# Patient Record
Sex: Male | Born: 1950
Health system: Southern US, Community
[De-identification: ages and names within clinical notes are randomized; demographics above are authoritative.]

## PROBLEM LIST (undated history)

## (undated) DIAGNOSIS — I4819 Other persistent atrial fibrillation: Secondary | ICD-10-CM

## (undated) DIAGNOSIS — I509 Heart failure, unspecified: Secondary | ICD-10-CM

## (undated) DIAGNOSIS — I428 Other cardiomyopathies: Secondary | ICD-10-CM

## (undated) DIAGNOSIS — I5020 Unspecified systolic (congestive) heart failure: Secondary | ICD-10-CM

## (undated) DIAGNOSIS — I10 Essential (primary) hypertension: Secondary | ICD-10-CM

## (undated) HISTORY — DX: Unspecified systolic (congestive) heart failure: I50.20

## (undated) HISTORY — DX: Heart failure, unspecified: I50.9

## (undated) HISTORY — DX: Other cardiomyopathies: I42.8

## (undated) HISTORY — PX: CARDIAC CATHETERIZATION: SHX172

## (undated) HISTORY — DX: Other persistent atrial fibrillation: I48.19

---

## 2011-10-04 ENCOUNTER — Emergency Department: Payer: Self-pay | Admitting: Unknown Physician Specialty

## 2015-08-01 DIAGNOSIS — Y9389 Activity, other specified: Secondary | ICD-10-CM | POA: Insufficient documentation

## 2015-08-01 DIAGNOSIS — T161XXA Foreign body in right ear, initial encounter: Secondary | ICD-10-CM | POA: Insufficient documentation

## 2015-08-01 DIAGNOSIS — Y9289 Other specified places as the place of occurrence of the external cause: Secondary | ICD-10-CM | POA: Insufficient documentation

## 2015-08-01 DIAGNOSIS — Y998 Other external cause status: Secondary | ICD-10-CM | POA: Insufficient documentation

## 2015-08-01 DIAGNOSIS — X58XXXA Exposure to other specified factors, initial encounter: Secondary | ICD-10-CM | POA: Insufficient documentation

## 2015-08-02 ENCOUNTER — Encounter: Payer: Self-pay | Admitting: Emergency Medicine

## 2015-08-02 ENCOUNTER — Emergency Department
Admission: EM | Admit: 2015-08-02 | Discharge: 2015-08-02 | Disposition: A | Discharge status: Home / Self Care | Payer: Self-pay | Attending: Emergency Medicine | Admitting: Emergency Medicine

## 2015-08-02 DIAGNOSIS — T161XXA Foreign body in right ear, initial encounter: Secondary | ICD-10-CM

## 2015-08-02 MED ORDER — LIDOCAINE HCL (PF) 1 % IJ SOLN
5.0000 mL | Freq: Once | INTRAMUSCULAR | Status: AC
  Administered 2015-08-02: 5 mL

## 2015-08-02 MED ORDER — LIDOCAINE HCL (PF) 1 % IJ SOLN
INTRAMUSCULAR | Status: AC
  Administered 2015-08-02: 5 mL
  Filled 2015-08-02: qty 5

## 2015-08-02 NOTE — Discharge Instructions (Signed)
Ear Foreign Body °An ear foreign body is an object that is stuck in the ear. It is common for young children to put objects into the ear canal. These may include pebbles, beads, beans, and any other small objects which will fit. In adults, objects such as cotton swabs may become lodged in the ear canal. In all ages, the most common foreign bodies are insects that enter the ear canal.  °SYMPTOMS  °Foreign bodies may cause pain, buzzing or roaring sounds, hearing loss, and ear drainage.  °HOME CARE INSTRUCTIONS  °· Keep all follow-up appointments with your caregiver as told. °· Keep small objects out of reach of young children. Tell them not to put anything in their ears. °SEEK IMMEDIATE MEDICAL CARE IF:  °· You have bleeding from the ear. °· You have increased pain or swelling of the ear. °· You have reduced hearing. °· You have discharge coming from the ear. °· You have a fever. °· You have a headache. °MAKE SURE YOU:  °· Understand these instructions. °· Will watch your condition. °· Will get help right away if you are not doing well or get worse. °Document Released: 11/09/2000 Document Revised: 02/04/2012 Document Reviewed: 06/30/2008 °ExitCare® Patient Information ©2015 [employer]. This information is not intended to replace advice given to you by your health care provider. Make sure you discuss any questions you have with your health care provider. ° °

## 2015-08-02 NOTE — ED Notes (Signed)
Patient ambulatory to triage with steady gait, without difficulty or distress noted; pt reports feeling something fly in his right ear while watching TV; denies pain but reports can feel it moving; using otoscope, what appears to be a roach is noted to right ear; irrigated with 73ml xylocaine 1% and pt reports no further movement noted

## 2015-08-02 NOTE — ED Provider Notes (Signed)
[facility] Emergency Department Provider Note  ____________________________________________  Time seen: 12:35 AM  I have reviewed the triage vital signs and the nursing notes.   HISTORY  Chief Complaint Foreign Body in Ear      HPI Mikeal Florencio is a 64 y.o. male presents with insect in his right ear 45 minutes. Patient states he was watching television at the time and felt the insect enters right year.   Past medical history None   There are no active problems to display for this patient.  Past surgical history None No current outpatient prescriptions on file.  Allergies No known drug allergies No family history on file.  Social History Social History  Substance Use Topics  . Smoking status: None  . Smokeless tobacco: None  . Alcohol Use: None    Review of Systems  Constitutional: Negative for fever. Eyes: Negative for visual changes. ENT: Negative for sore throat. Positive for insect in left ear Cardiovascular: Negative for chest pain. Respiratory: Negative for shortness of breath. Gastrointestinal: Negative for abdominal pain, vomiting and diarrhea. Genitourinary: Negative for dysuria. Musculoskeletal: Negative for back pain. Skin: Negative for rash. Neurological: Negative for headaches, focal weakness or numbness.   10-point ROS otherwise negative.  ____________________________________________   PHYSICAL EXAM:  VITAL SIGNS: ED Triage Vitals  Enc Vitals Group     BP 08/02/15 0018 149/92 mmHg     Pulse Rate 08/02/15 0018 80     Resp --      Temp 08/02/15 0018 98 F (36.7 C)     Temp Source 08/02/15 0018 Oral     SpO2 08/02/15 0018 99 %     Weight 08/02/15 0018 187 lb (84.823 kg)     Height 08/02/15 0018 5\' 9"  (1.753 m)     Head Cir --      Peak Flow --      Pain Score --      Pain Loc --      Pain Edu? --      Excl. in GC? --      Constitutional: Alert and oriented. Well appearing and in no  distress. Eyes: Conjunctivae are normal. PERRL. Normal extraocular movements. ENT   Head: Normocephalic and atraumatic.   Nose: No congestion/rhinnorhea.   Mouth/Throat: Mucous membranes are moist.   Neck: No stridor. Ears: Large insect noted in the patient's right external auditory canal. Hematological/Lymphatic/Immunilogical: No cervical lymphadenopathy.   PROCEDURES  Procedure(s) performed:   large cockroach removal from the patient's right external auditory canal via forceps. External auditory canal intact after removal of insect. Right external auditory canal irrigated with normal saline following procedure. Right tympanic membrane visualized and intact following removal.   ____________________________________________   INITIAL IMPRESSION / ASSESSMENT AND PLAN / ED COURSE  Pertinent labs & imaging results that were available during my care of the patient were reviewed by me and considered in my medical decision making (see chart for details).  Patient tolerated procedure well.  ____________________________________________   FINAL CLINICAL IMPRESSION(S) / ED DIAGNOSES  Final diagnoses:  Foreign body in right ear, initial encounter      Darci Current, MD 08/02/15 [phone]

## 2015-08-02 NOTE — ED Notes (Signed)
Dr Manson Passey to triage and insect removed

## 2016-02-14 ENCOUNTER — Emergency Department
Admission: EM | Admit: 2016-02-14 | Discharge: 2016-02-14 | Disposition: A | Discharge status: Home / Self Care | Payer: No Typology Code available for payment source | Attending: Student | Admitting: Student

## 2016-02-14 ENCOUNTER — Encounter: Payer: Self-pay | Admitting: Emergency Medicine

## 2016-02-14 ENCOUNTER — Emergency Department: Payer: No Typology Code available for payment source

## 2016-02-14 DIAGNOSIS — R52 Pain, unspecified: Secondary | ICD-10-CM | POA: Diagnosis not present

## 2016-02-14 DIAGNOSIS — Y9389 Activity, other specified: Secondary | ICD-10-CM | POA: Diagnosis not present

## 2016-02-14 DIAGNOSIS — Y998 Other external cause status: Secondary | ICD-10-CM | POA: Diagnosis not present

## 2016-02-14 DIAGNOSIS — M542 Cervicalgia: Secondary | ICD-10-CM | POA: Diagnosis not present

## 2016-02-14 DIAGNOSIS — Y92410 Unspecified street and highway as the place of occurrence of the external cause: Secondary | ICD-10-CM | POA: Insufficient documentation

## 2016-02-14 DIAGNOSIS — S161XXA Strain of muscle, fascia and tendon at neck level, initial encounter: Secondary | ICD-10-CM | POA: Insufficient documentation

## 2016-02-14 DIAGNOSIS — S4991XA Unspecified injury of right shoulder and upper arm, initial encounter: Secondary | ICD-10-CM | POA: Diagnosis present

## 2016-02-14 DIAGNOSIS — M25511 Pain in right shoulder: Secondary | ICD-10-CM | POA: Diagnosis not present

## 2016-02-14 DIAGNOSIS — S29002A Unspecified injury of muscle and tendon of back wall of thorax, initial encounter: Secondary | ICD-10-CM | POA: Diagnosis not present

## 2016-02-14 DIAGNOSIS — S46911A Strain of unspecified muscle, fascia and tendon at shoulder and upper arm level, right arm, initial encounter: Secondary | ICD-10-CM | POA: Diagnosis not present

## 2016-02-14 MED ORDER — IBUPROFEN 600 MG PO TABS: 600.0000 mg | ORAL_TABLET | Freq: Four times a day (QID) | ORAL | Status: DC | PRN

## 2016-02-14 MED ORDER — TRAMADOL HCL 50 MG PO TABS: 50.0000 mg | ORAL_TABLET | Freq: Four times a day (QID) | ORAL | Status: DC | PRN

## 2016-02-14 MED ORDER — IBUPROFEN 600 MG PO TABS
600.0000 mg | ORAL_TABLET | Freq: Once | ORAL | Status: AC
  Administered 2016-02-14: 600 mg via ORAL
  Filled 2016-02-14: qty 1
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
## 2016-02-14 NOTE — Discharge Instructions (Signed)

## 2018-05-03 ENCOUNTER — Encounter: Payer: Self-pay | Admitting: Emergency Medicine

## 2018-05-03 ENCOUNTER — Inpatient Hospital Stay (HOSPITAL_COMMUNITY)
Admit: 2018-05-03 | Discharge: 2018-05-03 | Disposition: A | Discharge status: Home / Self Care | Payer: Medicare Other | Attending: Internal Medicine | Admitting: Internal Medicine

## 2018-05-03 ENCOUNTER — Ambulatory Visit (INDEPENDENT_AMBULATORY_CARE_PROVIDER_SITE_OTHER)
Admission: EM | Admit: 2018-05-03 | Discharge: 2018-05-03 | Disposition: A | Discharge status: Other Acute Inpatient Hospital | Payer: Medicare Other | Source: Home / Self Care | Attending: Family Medicine | Admitting: Family Medicine

## 2018-05-03 ENCOUNTER — Inpatient Hospital Stay
Admission: EM | Admit: 2018-05-03 | Discharge: 2018-05-08 | DRG: 286 | Disposition: A | Discharge status: Home Health | Payer: Medicare Other | Attending: Internal Medicine | Admitting: Internal Medicine

## 2018-05-03 ENCOUNTER — Emergency Department: Payer: Medicare Other

## 2018-05-03 ENCOUNTER — Other Ambulatory Visit: Payer: Self-pay

## 2018-05-03 ENCOUNTER — Encounter: Payer: Self-pay | Admitting: Gynecology

## 2018-05-03 DIAGNOSIS — E876 Hypokalemia: Secondary | ICD-10-CM | POA: Diagnosis not present

## 2018-05-03 DIAGNOSIS — I5021 Acute systolic (congestive) heart failure: Secondary | ICD-10-CM | POA: Diagnosis not present

## 2018-05-03 DIAGNOSIS — R0602 Shortness of breath: Secondary | ICD-10-CM

## 2018-05-03 DIAGNOSIS — Z8249 Family history of ischemic heart disease and other diseases of the circulatory system: Secondary | ICD-10-CM

## 2018-05-03 DIAGNOSIS — I10 Essential (primary) hypertension: Secondary | ICD-10-CM | POA: Diagnosis not present

## 2018-05-03 DIAGNOSIS — Z800 Family history of malignant neoplasm of digestive organs: Secondary | ICD-10-CM

## 2018-05-03 DIAGNOSIS — Z791 Long term (current) use of non-steroidal anti-inflammatories (NSAID): Secondary | ICD-10-CM

## 2018-05-03 DIAGNOSIS — I9581 Postprocedural hypotension: Secondary | ICD-10-CM | POA: Diagnosis not present

## 2018-05-03 DIAGNOSIS — I444 Left anterior fascicular block: Secondary | ICD-10-CM | POA: Diagnosis present

## 2018-05-03 DIAGNOSIS — Z79891 Long term (current) use of opiate analgesic: Secondary | ICD-10-CM

## 2018-05-03 DIAGNOSIS — I081 Rheumatic disorders of both mitral and tricuspid valves: Secondary | ICD-10-CM | POA: Diagnosis present

## 2018-05-03 DIAGNOSIS — I509 Heart failure, unspecified: Secondary | ICD-10-CM

## 2018-05-03 DIAGNOSIS — I493 Ventricular premature depolarization: Secondary | ICD-10-CM | POA: Diagnosis present

## 2018-05-03 DIAGNOSIS — I272 Pulmonary hypertension, unspecified: Secondary | ICD-10-CM | POA: Diagnosis present

## 2018-05-03 DIAGNOSIS — I361 Nonrheumatic tricuspid (valve) insufficiency: Secondary | ICD-10-CM | POA: Diagnosis not present

## 2018-05-03 DIAGNOSIS — I428 Other cardiomyopathies: Secondary | ICD-10-CM | POA: Diagnosis present

## 2018-05-03 DIAGNOSIS — I481 Persistent atrial fibrillation: Secondary | ICD-10-CM | POA: Diagnosis present

## 2018-05-03 DIAGNOSIS — Z23 Encounter for immunization: Secondary | ICD-10-CM

## 2018-05-03 DIAGNOSIS — I4891 Unspecified atrial fibrillation: Secondary | ICD-10-CM

## 2018-05-03 DIAGNOSIS — I110 Hypertensive heart disease with heart failure: Secondary | ICD-10-CM | POA: Diagnosis not present

## 2018-05-03 DIAGNOSIS — Z8049 Family history of malignant neoplasm of other genital organs: Secondary | ICD-10-CM

## 2018-05-03 DIAGNOSIS — I340 Nonrheumatic mitral (valve) insufficiency: Secondary | ICD-10-CM | POA: Diagnosis not present

## 2018-05-03 DIAGNOSIS — R001 Bradycardia, unspecified: Secondary | ICD-10-CM | POA: Diagnosis not present

## 2018-05-03 DIAGNOSIS — I5043 Acute on chronic combined systolic (congestive) and diastolic (congestive) heart failure: Secondary | ICD-10-CM | POA: Diagnosis not present

## 2018-05-03 DIAGNOSIS — J810 Acute pulmonary edema: Secondary | ICD-10-CM

## 2018-05-03 HISTORY — DX: Essential (primary) hypertension: I10

## 2018-05-03 LAB — CBC WITH DIFFERENTIAL/PLATELET
BASOS PCT: 0 %
Basophils Absolute: 0 10*3/uL (ref 0–0.1)
EOS ABS: 0.1 10*3/uL (ref 0–0.7)
Eosinophils Relative: 1 %
HCT: 37.1 % — ABNORMAL LOW (ref 40.0–52.0)
Hemoglobin: 13.3 g/dL (ref 13.0–18.0)
Lymphocytes Relative: 16 %
Lymphs Abs: 1.1 10*3/uL (ref 1.0–3.6)
MCH: 32.4 pg (ref 26.0–34.0)
MCHC: 36 g/dL (ref 32.0–36.0)
MCV: 90.1 fL (ref 80.0–100.0)
Monocytes Absolute: 0.5 10*3/uL (ref 0.2–1.0)
Monocytes Relative: 7 %
NEUTROS ABS: 5.1 10*3/uL (ref 1.4–6.5)
NEUTROS PCT: 76 %
PLATELETS: 216 10*3/uL (ref 150–440)
RBC: 4.12 MIL/uL — ABNORMAL LOW (ref 4.40–5.90)
RDW: 12.4 % (ref 11.5–14.5)
WBC: 6.7 10*3/uL (ref 3.8–10.6)

## 2018-05-03 LAB — COMPREHENSIVE METABOLIC PANEL
ALT: 20 U/L (ref 17–63)
AST: 30 U/L (ref 15–41)
Albumin: 3.8 g/dL (ref 3.5–5.0)
Alkaline Phosphatase: 51 U/L (ref 38–126)
Anion gap: 8 (ref 5–15)
BILIRUBIN TOTAL: 0.9 mg/dL (ref 0.3–1.2)
BUN: 15 mg/dL (ref 6–20)
CHLORIDE: 108 mmol/L (ref 101–111)
CO2: 21 mmol/L — ABNORMAL LOW (ref 22–32)
Calcium: 8.5 mg/dL — ABNORMAL LOW (ref 8.9–10.3)
Creatinine, Ser: 0.84 mg/dL (ref 0.61–1.24)
GFR calc Af Amer: >60 mL/min (ref >60)
Glucose, Bld: 108 mg/dL — ABNORMAL HIGH (ref 65–99)
POTASSIUM: 3.8 mmol/L (ref 3.5–5.1)
Sodium: 137 mmol/L (ref 135–145)
TOTAL PROTEIN: 7.3 g/dL (ref 6.5–8.1)

## 2018-05-03 LAB — PROTIME-INR
INR: 1.09
Prothrombin Time: 14 seconds (ref 11.4–15.2)

## 2018-05-03 LAB — BRAIN NATRIURETIC PEPTIDE: B NATRIURETIC PEPTIDE 5: 1561 pg/mL — AB (ref 0.0–100.0)

## 2018-05-03 LAB — TROPONIN I
Troponin I: <0.03 ng/mL (ref <0.03)
Troponin I: <0.03 ng/mL (ref <0.03)

## 2018-05-03 LAB — TSH: TSH: 1.533 u[IU]/mL (ref 0.350–4.500)

## 2018-05-03 LAB — APTT: APTT: 30 s (ref 24–36)

## 2018-05-03 LAB — HEPARIN LEVEL (UNFRACTIONATED): Heparin Unfractionated: 0.39 IU/mL (ref 0.30–0.70)

## 2018-05-03 MED ORDER — MAGNESIUM SULFATE 2 GM/50ML IV SOLN
2.0000 g | Freq: Once | INTRAVENOUS | Status: AC
  Administered 2018-05-03: 2 g via INTRAVENOUS
  Filled 2018-05-03: qty 50

## 2018-05-03 MED ORDER — HEPARIN (PORCINE) IN NACL 100-0.45 UNIT/ML-% IJ SOLN
1200.0000 [IU]/h | INTRAMUSCULAR | Status: DC
  Administered 2018-05-03 – 2018-05-05 (×3): 1200 [IU]/h via INTRAVENOUS
  Filled 2018-05-03 (×3): qty 250

## 2018-05-03 MED ORDER — ONDANSETRON HCL 4 MG/2ML IJ SOLN: 4.0000 mg | Freq: Four times a day (QID) | INTRAMUSCULAR | Status: DC | PRN

## 2018-05-03 MED ORDER — METOPROLOL TARTRATE 5 MG/5ML IV SOLN
5.0000 mg | INTRAVENOUS | Status: DC | PRN
Start: 2018-05-03 — End: 2018-05-07

## 2018-05-03 MED ORDER — METOPROLOL TARTRATE 25 MG PO TABS
25.0000 mg | ORAL_TABLET | Freq: Three times a day (TID) | ORAL | Status: DC
  Administered 2018-05-03 – 2018-05-06 (×9): 25 mg via ORAL
  Filled 2018-05-03 (×9): qty 1

## 2018-05-03 MED ORDER — FUROSEMIDE 10 MG/ML IJ SOLN
40.0000 mg | Freq: Once | INTRAMUSCULAR | Status: AC
  Administered 2018-05-03: 40 mg via INTRAVENOUS

## 2018-05-03 MED ORDER — ONDANSETRON HCL 4 MG PO TABS: 4.0000 mg | ORAL_TABLET | Freq: Four times a day (QID) | ORAL | Status: DC | PRN

## 2018-05-03 MED ORDER — HEPARIN BOLUS VIA INFUSION
4000.0000 [IU] | Freq: Once | INTRAVENOUS | Status: AC
  Administered 2018-05-03: 4000 [IU] via INTRAVENOUS
  Filled 2018-05-03: qty 4000

## 2018-05-03 MED ORDER — FUROSEMIDE 10 MG/ML IJ SOLN
40.0000 mg | Freq: Every day | INTRAMUSCULAR | Status: DC
  Administered 2018-05-04: 40 mg via INTRAVENOUS
  Filled 2018-05-03: qty 4

## 2018-05-03 MED ORDER — ACETAMINOPHEN 325 MG PO TABS: 650.0000 mg | ORAL_TABLET | Freq: Four times a day (QID) | ORAL | Status: DC | PRN

## 2018-05-03 MED ORDER — LORAZEPAM 1 MG PO TABS
1.0000 mg | ORAL_TABLET | Freq: Once | ORAL | Status: AC
  Administered 2018-05-03: 1 mg via ORAL
  Filled 2018-05-03: qty 1

## 2018-05-03 MED ORDER — ACETAMINOPHEN 650 MG RE SUPP: 650.0000 mg | Freq: Four times a day (QID) | RECTAL | Status: DC | PRN

## 2018-05-03 MED ORDER — ASPIRIN EC 81 MG PO TBEC
81.0000 mg | DELAYED_RELEASE_TABLET | Freq: Every day | ORAL | Status: DC
  Administered 2018-05-04: 81 mg via ORAL
  Filled 2018-05-03: qty 1

## 2018-05-03 MED ORDER — METOPROLOL TARTRATE 25 MG PO TABS
25.0000 mg | ORAL_TABLET | Freq: Once | ORAL | Status: AC
  Administered 2018-05-03: 25 mg via ORAL
  Filled 2018-05-03: qty 1

## 2018-05-03 MED ORDER — PNEUMOCOCCAL VAC POLYVALENT 25 MCG/0.5ML IJ INJ
0.5000 mL | INJECTION | INTRAMUSCULAR | Status: AC
  Administered 2018-05-04: 0.5 mL via INTRAMUSCULAR
  Filled 2018-05-03: qty 0.5

## 2018-05-03 MED ORDER — FUROSEMIDE 10 MG/ML IJ SOLN
INTRAMUSCULAR | Status: AC
  Administered 2018-05-03: 40 mg via INTRAVENOUS
  Filled 2018-05-03: qty 4

## 2018-05-03 NOTE — ED Notes (Signed)
Dorian, EDT called to transport pt to the floor

## 2018-05-03 NOTE — Progress Notes (Signed)
Patient ID: Donald Molina, male   DOB: 1951/05/22, 66 y.o.   MRN: [id number]  ACP note  Patient and family at bedside  Diagnosis: Acute congestive heart failure, atrial fibrillation with rapid ventricular response, essential hypertension.  CODE STATUS discussed.  Patient would like to be a full code at this point in time.  Plan echocardiogram to evaluate heart function.  Slow the heart rate down with metoprolol.  Cardiology consultation.  Monitor further on telemetry  Time spent on ACP discussion 17 minutes  Dr. Alford Highland

## 2018-05-03 NOTE — Progress Notes (Signed)
ANTICOAGULATION CONSULT NOTE - Initial Consult  Pharmacy Consult for heparin drip  Indication: atrial fibrillation  No Known Allergies  Patient Measurements: Height: 5\' 7"  (170.2 cm) Weight: 179 lb (81.2 kg) IBW/kg (Calculated) : 66.1 Heparin Dosing Weight: 81.2 kg   Vital Signs: Temp: 98 F (36.7 C) (06/08 1148) Temp Source: Oral (06/08 1148) BP: 130/109 (06/08 1300) Pulse Rate: 57 (06/08 1300)  Labs: Recent Labs    05/03/18 1153  HGB 13.3  HCT 37.1*  PLT 216  CREATININE 0.84  TROPONINI <0.03    Estimated Creatinine Clearance: 87 mL/min (by C-G formula based on SCr of 0.84 mg/dL).   Medical History: Past Medical History:  Diagnosis Date  . Hypertension    Assessment: 67 year old male presents to ED with SOB and in new onset atrial fibrillation. Has not been on anticoagulation PTA.   Goal of Therapy:  Heparin level 0.3-0.7 units/ml Monitor platelets by anticoagulation protocol: Yes   Plan:  Will give heparin bolus 4000 units followed by heparin drip 1200 units/hr. Will check first HL 6 hours from start of heparin drip.  APTT and PT/INR ordered.  Will monitor H&H and daily heparin levels.   Cleopatra Cedar, PharmD Pharmacy Resident  05/03/2018,1:32 PM

## 2018-05-03 NOTE — ED Notes (Signed)
Patient transported to X-ray 

## 2018-05-03 NOTE — Progress Notes (Signed)
Patient called out to NT, saying he couldn't get comfortable and he couldn't get any rest and that he seems a little anxious.  On call MD paged and called back.  We shall try 1 mg ativan tonight.

## 2018-05-03 NOTE — Consult Note (Signed)
Cardiology Consultation:   Patient ID: Donald Molina; [id number]; [birth year]   Admit date: 05/03/2018 Date of Consult: 05/03/2018  Primary Care Provider: Patient, No Pcp Per Primary Cardiologist: No primary care provider on file. New patient    Patient Profile:   Donald Molina is a 67 y.o. male with no medical history who is being seen today for the evaluation of AFib at the request of Dr. Renae Gloss.  History of Present Illness:   Donald Molina is a 67 year old man who does not see the doctor on a regular basis.  His family is with him in the hospital room.  He reports he has been short of breath for the past several days.  The family states it has been on and off for several weeks.  Over the past few days, he has had trouble lying flat to sleep.  He has had to prop up on several pillows.  He noticed some leg swelling.  Because of these symptoms, he came to the emergency room.  He was found to be in atrial fibrillation with rapid ventricular response.  He was also found to be volume overloaded with pleural effusions noted on chest x-ray.  He was given metoprolol for rate control and IV Lasix.  He was started on IV heparin.  We had a discussion about the issues related to atrial fibrillation including rapid heart rates and embolic stroke along with heart failure.  His blood pressure is been elevated while in the emergency room and he has received metoprolol to help with this.  Currently, he feels better.  Past Medical History:  Diagnosis Date  . Hypertension     Past Surgical History:  Procedure Laterality Date  . NO PAST SURGERIES        Inpatient Medications: Scheduled Meds: . aspirin EC  81 mg Oral Daily  . [START ON 05/04/2018] furosemide  40 mg Intravenous Daily  . metoprolol tartrate  25 mg Oral TID   Continuous Infusions: . heparin 1,200 Units/hr (05/03/18 1416)   PRN Meds: acetaminophen **OR** acetaminophen, metoprolol tartrate, ondansetron **OR** ondansetron (ZOFRAN)  IV  Allergies:   No Known Allergies  Social History:   Social History   Socioeconomic History  . Marital status: Divorced    Spouse name: Not on file  . Number of children: Not on file  . Years of education: Not on file  . Highest education level: Not on file  Occupational History  . Not on file  Social Needs  . Financial resource strain: Not on file  . Food insecurity:    Worry: Not on file    Inability: Not on file  . Transportation needs:    Medical: Not on file    Non-medical: Not on file  Tobacco Use  . Smoking status: Never Smoker  . Smokeless tobacco: Never Used  Substance and Sexual Activity  . Alcohol use: Yes  . Drug use: Never  . Sexual activity: Not on file  Lifestyle  . Physical activity:    Days per week: Not on file    Minutes per session: Not on file  . Stress: Not on file  Relationships  . Social connections:    Talks on phone: Not on file    Gets together: Not on file    Attends religious service: Not on file    Active member of club or organization: Not on file    Attends meetings of clubs or organizations: Not on file    Relationship status: Not on file  .  Intimate partner violence:    Fear of current or ex partner: Not on file    Emotionally abused: Not on file    Physically abused: Not on file    Forced sexual activity: Not on file  Other Topics Concern  . Not on file  Social History Narrative  . Not on file    Family History:    Family History  Problem Relation Age of Onset  . Cancer Mother        pancreatic  . Cervical cancer Mother   . Dementia Father   . Heart failure Father      ROS:  Please see the history of present illness.  Shortness of breath All other ROS reviewed and negative.     Physical Exam/Data:   Vitals:   05/03/18 1230 05/03/18 1300 05/03/18 1404 05/03/18 1409  BP: (!) 157/97 (!) 130/109 (!) 135/100   Pulse:  (!) 57 96   Resp: (!) 24 18 18    Temp:   97.6 F (36.4 C)   TempSrc:   Oral   SpO2:  97%  99%   Weight:    183 lb 9.6 oz (83.3 kg)  Height:    5\' 11"  (1.803 m)   No intake or output data in the 24 hours ending 05/03/18 1458 Filed Weights   05/03/18 1149 05/03/18 1409  Weight: 179 lb (81.2 kg) 183 lb 9.6 oz (83.3 kg)   Body mass index is 25.61 kg/m.  General:  Well nourished, well developed, in no acute distress HEENT: normal Lymph: no adenopathy Neck: no JVD Endocrine:  No thryomegaly Vascular: palpable radial pulse Cardiac:  normal S1, S2; irregularly irregular; no murmur  Lungs:  clear to auscultation bilaterally, no wheezing, rhonchi or rales  Abd: soft, nontender, no hepatomegaly  Ext: 1+ bilateral ankle edema Musculoskeletal:  No deformities, BUE and BLE strength normal and equal Skin: warm and dry  Neuro:  CNs 2-12 intact, no focal abnormalities noted Psych:  Normal affect   EKG:  The EKG was personally reviewed and demonstrates: Atrial fibrillation with rapid ventricular response,ST-T wave changes, particularly in the anterior leads which could be consistent with ischemia Telemetry:  Telemetry was personally reviewed and demonstrates:  Atrial fibrillation with better rate control  Relevant CV Studies: None, echocardiogram pending  Laboratory Data:  Chemistry Recent Labs  Lab 05/03/18 1153  NA 137  K 3.8  CL 108  CO2 21*  GLUCOSE 108*  BUN 15  CREATININE 0.84  CALCIUM 8.5*  GFRNONAA >60  GFRAA >60  ANIONGAP 8    Recent Labs  Lab 05/03/18 1153  PROT 7.3  ALBUMIN 3.8  AST 30  ALT 20  ALKPHOS 51  BILITOT 0.9   Hematology Recent Labs  Lab 05/03/18 1153  WBC 6.7  RBC 4.12*  HGB 13.3  HCT 37.1*  MCV 90.1  MCH 32.4  MCHC 36.0  RDW 12.4  PLT 216   Cardiac Enzymes Recent Labs  Lab 05/03/18 1153  TROPONINI <0.03   No results for input(s): TROPIPOC in the last 168 hours.  BNP Recent Labs  Lab 05/03/18 1153  BNP 1,561.0*    DDimer No results for input(s): DDIMER in the last 168 hours.  Radiology/Studies:  Dg Chest 2  View  Result Date: 05/03/2018 CLINICAL DATA:  New dyspnea EXAM: CHEST - 2 VIEW COMPARISON:  10/04/2011 chest radiograph. FINDINGS: Stable cardiomediastinal silhouette with mild cardiomegaly. No pneumothorax. Hyperinflated lungs. Small bilateral pleural effusions. Mild pulmonary edema. No acute consolidative airspace disease.  IMPRESSION: 1. Mild congestive heart failure with small bilateral pleural effusions. 2. Hyperinflated lungs, cannot exclude COPD. Electronically Signed   By: Delbert Phenix M.D.   On: 05/03/2018 12:16    Assessment and Plan:   1. Atrial fibrillation: Continue metoprolol for rate control.  Will need to check echocardiogram to evaluate for structural heart disease.  This will also help guide medical treatment for blood pressure and help clarify chads vasc score.  This score is at least 2 score based on his age and hypertension, so he will need oral anticoagulation upon discharge if no bleeding issues are found.  IV heparin for now.  Would not consider antiarrhythmic at this time given his lack of symptoms of palpitations.  We will have to see how easily heart failure can be managed. 2. Congestive heart failure, acute: Unclear whether this is systolic or diastolic.  Check echocardiogram.  Continue IV Lasixfor pulmonary edema. 3. Long-term, he will need a regular primary care physician.  I explained to him that there are many screening tests that he would be due for given his age.   For questions or updates, please contact [facility] Please consult [email or web address] for contact info under Cardiology/STEMI.   SignedLance Muss, MD  05/03/2018 2:58 PM

## 2018-05-03 NOTE — Progress Notes (Signed)
ANTICOAGULATION CONSULT NOTE - Initial Consult  Pharmacy Consult for heparin drip  Indication: atrial fibrillation  No Known Allergies  Patient Measurements: Height: 5\' 11"  (180.3 cm) Weight: 183 lb 9.6 oz (83.3 kg) IBW/kg (Calculated) : 75.3 Heparin Dosing Weight: 81.2 kg   Vital Signs: Temp: 98.5 F (36.9 C) (06/08 2019) Temp Source: Oral (06/08 2019) BP: 134/94 (06/08 2019) Pulse Rate: 92 (06/08 2019)  Labs: Recent Labs    05/03/18 1153 05/03/18 1506 05/03/18 1851  HGB 13.3  --   --   HCT 37.1*  --   --   PLT 216  --   --   APTT 30  --   --   LABPROT 14.0  --   --   INR 1.09  --   --   HEPARINUNFRC  --   --  0.39  CREATININE 0.84  --   --   TROPONINI <0.03 <0.03 <0.03    Estimated Creatinine Clearance: 90.9 mL/min (by C-G formula based on SCr of 0.84 mg/dL).   Medical History: Past Medical History:  Diagnosis Date  . Hypertension    Assessment: 66 year old male presents to ED with SOB and in new onset atrial fibrillation. Has not been on anticoagulation PTA.   Goal of Therapy:  Heparin level 0.3-0.7 units/ml Monitor platelets by anticoagulation protocol: Yes   Plan:  Will give heparin bolus 4000 units followed by heparin drip 1200 units/hr. Will check first HL 6 hours from start of heparin drip.  APTT and PT/INR ordered.  Will monitor H&H and daily heparin levels.   6/8:  HL @ 19:00 = 0.39 Will continue this pt on current rate and recheck HL on 6/9 @ 0100.   Scherrie Gerlach, PharmD Pharmacy Resident  05/03/2018,9:12 PM

## 2018-05-03 NOTE — H&P (Signed)
[facility] [place] [facility]   PATIENT NAME: Donald Molina    MR#:  [id number]  DATE OF BIRTH:  Dec 26, 1950  DATE OF ADMISSION:  05/03/2018  PRIMARY CARE PHYSICIAN: None  REQUESTING/REFERRING PHYSICIAN: Dr Merrily Brittle  CHIEF COMPLAINT:   Chief Complaint  Patient presents with  . Shortness of Breath  . Atrial Fibrillation    HISTORY OF PRESENT ILLNESS:  Donald Molina  is a 67 y.o. male presents the ED with shortness of breath going on for 3 days.  His feet have been swollen since last night.  He does get some chest discomfort when he lies flat.  He is unable to breathe when he lies flat.  The chest pain only happens when he lies back.  Not severe in nature.  Relieved when he sits up.  No shortness of breath has been progressive for the last 3 days.  In the ED, he was found to have atrial fibrillation and congestive heart failure and hospitalist services were contacted for further evaluation.  PAST MEDICAL HISTORY:   Past Medical History:  Diagnosis Date  . Hypertension     PAST SURGICAL HISTORY:   Past Surgical History:  Procedure Laterality Date  . NO PAST SURGERIES      SOCIAL HISTORY:   Social History   Tobacco Use  . Smoking status: Never Smoker  . Smokeless tobacco: Never Used  Substance Use Topics  . Alcohol use: Yes    FAMILY HISTORY:   Family History  Problem Relation Age of Onset  . Cancer Mother        pancreatic  . Cervical cancer Mother   . Dementia Father   . Heart failure Father     DRUG ALLERGIES:  No Known Allergies  REVIEW OF SYSTEMS:  CONSTITUTIONAL: No fever, chills or sweats..  Occasional sweats.  Positive for weight loss.  Positive for weakness EYES: No blurred or double vision.  EARS, NOSE, AND THROAT: No tinnitus or ear pain. No sore throat RESPIRATORY: No cough, positive for shortness of breath.  No wheezing or hemoptysis.  CARDIOVASCULAR: Positive for chest pain when lying flat.  No  orthopnea, edema.  GASTROINTESTINAL: No nausea, vomiting, diarrhea or abdominal pain. No blood in bowel movements GENITOURINARY: No dysuria, hematuria.  ENDOCRINE: No polyuria, nocturia,  HEMATOLOGY: No anemia, easy bruising or bleeding SKIN: No rash or lesion.  Positive for itching.  MUSCULOSKELETAL: No joint pain or arthritis.   NEUROLOGIC: No tingling, numbness, weakness.  PSYCHIATRY: No anxiety or depression.   MEDICATIONS AT HOME:   Prior to Admission medications   Not on File   Patient does not take any medications.  VITAL SIGNS:  Blood pressure (!) 130/109, pulse (!) 57, temperature 98 F (36.7 C), temperature source Oral, resp. rate 18, height 5\' 7"  (1.702 m), weight 81.2 kg (179 lb), SpO2 97 %. Heart rate on the monitor anywhere from 110-119 PHYSICAL EXAMINATION:  GENERAL:  67 y.o.-year-old patient lying in the bed with no acute distress.  EYES: Pupils equal, round, reactive to light and accommodation. No scleral icterus. Extraocular muscles intact.  HEENT: Head atraumatic, normocephalic. Oropharynx and nasopharynx clear.  NECK:  Supple, no jugular venous distention. No thyroid enlargement, no tenderness.  LUNGS:  decreased breath sounds bilateral bases, positive rales at the bases.  No rhonchi or crepitation. No use of accessory muscles of respiration.  CARDIOVASCULAR: S1, S2  irregular regular tachycardic. No murmurs, rubs, or gallops.  ABDOMEN: Soft, nontender, nondistended. Bowel sounds present. No  organomegaly or mass.  EXTREMITIES:  1+ pedal edema, no cyanosis, or clubbing.  NEUROLOGIC: Cranial nerves II through XII are intact. Muscle strength 5/5 in all extremities. Sensation intact. Gait not checked.  PSYCHIATRIC: The patient is alert and oriented x 3.  SKIN: No rash, lesion, or ulcer.   LABORATORY PANEL:   CBC Recent Labs  Lab 05/03/18 1153  WBC 6.7  HGB 13.3  HCT 37.1*  PLT 216    ------------------------------------------------------------------------------------------------------------------  Chemistries  Recent Labs  Lab 05/03/18 1153  NA 137  K 3.8  CL 108  CO2 21*  GLUCOSE 108*  BUN 15  CREATININE 0.84  CALCIUM 8.5*  AST 30  ALT 20  ALKPHOS 51  BILITOT 0.9   ------------------------------------------------------------------------------------------------------------------  Cardiac Enzymes Recent Labs  Lab 05/03/18 1153  TROPONINI <0.03   ------------------------------------------------------------------------------------------------------------------  RADIOLOGY:  Dg Chest 2 View  Result Date: 05/03/2018 CLINICAL DATA:  New dyspnea EXAM: CHEST - 2 VIEW COMPARISON:  10/04/2011 chest radiograph. FINDINGS: Stable cardiomediastinal silhouette with mild cardiomegaly. No pneumothorax. Hyperinflated lungs. Small bilateral pleural effusions. Mild pulmonary edema. No acute consolidative airspace disease. IMPRESSION: 1. Mild congestive heart failure with small bilateral pleural effusions. 2. Hyperinflated lungs, cannot exclude COPD. Electronically Signed   By: Delbert Phenix M.D.   On: 05/03/2018 12:16    EKG:   Atrial fibrillation 109 bpm, left anterior fascicular block, flipped T waves laterally IMPRESSION AND PLAN:   1.  Acute congestive heart failure.  Give Lasix 40 mg IV daily.  Check an echocardiogram. 2.  Atrial fibrillation with rapid ventricular response.  Start oral metoprolol.  PRN IV metoprolol.  Check an echocardiogram.  Heparin drip for now.  Give IV magnesium.  Check a TSH.  Cardiology consultation. 3.  Essential hypertension start metoprolol.   All the records are reviewed and case discussed with ED provider. Management plans discussed with the patient, family and they are in agreement.  CODE STATUS: full code  TOTAL TIME TAKING CARE OF THIS PATIENT: 50 minutes.    Alford Highland M.D on 05/03/2018 at 1:34 PM  Between 7am to 6pm -  Pager - [phone]  After 6pm call admission pager [phone]  [facility]  [phone]  CC: Primary care physician; Patient, No Pcp Per

## 2018-05-03 NOTE — ED Notes (Signed)
Attempted to call report on pt, RN not available will call me back.

## 2018-05-03 NOTE — Plan of Care (Signed)
  Problem: Education: Goal: Knowledge of General Education information will improve Outcome: Progressing   Problem: Health Behavior/Discharge Planning: Goal: Ability to manage health-related needs will improve Outcome: Progressing   Problem: Clinical Measurements: Goal: Will remain free from infection Outcome: Progressing   Problem: Nutrition: Goal: Adequate nutrition will be maintained Outcome: Progressing   Problem: Safety: Goal: Ability to remain free from injury will improve Outcome: Progressing

## 2018-05-03 NOTE — ED Triage Notes (Signed)
Per patient shortness of breath when at rest x 3 days. Patient stated both feet swollen x this am.

## 2018-05-03 NOTE — ED Provider Notes (Signed)
[facility]    CSN: [id number] Arrival date & time: 05/03/18  1031  History   Chief Complaint Chief Complaint  Patient presents with  . Shortness of Breath   HPI  67 year old male presents with shortness of breath.  Patient reports a 3-day history of shortness of breath.  He states that is primarily at night when he lies down.  He reports difficulty lying flat.  He also reports that he wakes up short of breath in the middle of the night.  He reports that he has had lower extremity edema as of this morning.  No reports of chest pain.  He takes no medication.  He has a history of hypertension.  No known relieving factors.  Symptoms are severe.  He also reports dyspnea with exertion.  No other associated symptoms.  No other complaints or concerns at this time.  Past Medical History:  Diagnosis Date  . Hypertension    History reviewed. No pertinent surgical history.  Home Medications    Prior to Admission medications   Medication Sig Start Date End Date Taking? Authorizing Provider  ibuprofen (ADVIL,MOTRIN) 600 MG tablet Take 1 tablet (600 mg total) by mouth every 6 (six) hours as needed. 02/14/16  Yes Triplett, Cari B, FNP  traMADol (ULTRAM) 50 MG tablet Take 1 tablet (50 mg total) by mouth every 6 (six) hours as needed. 02/14/16   Chinita Pester, FNP   Family History Family History  Problem Relation Age of Onset  . Cancer Mother        pancreatic  . Dementia Father    Social History Social History   Tobacco Use  . Smoking status: Never Smoker  . Smokeless tobacco: Never Used  Substance Use Topics  . Alcohol use: Yes  . Drug use: Never   Allergies   Patient has no known allergies.   Review of Systems Review of Systems  Constitutional: Negative.   Respiratory:       Shortness of breath, PND, orthopnea.  Cardiovascular: Positive for leg swelling.   Physical Exam Triage Vital Signs ED Triage Vitals  Enc Vitals Group     BP 05/03/18 1043 (!) 157/107       Pulse Rate 05/03/18 1043 (!) 112     Resp --      Temp 05/03/18 1043 98.2 F (36.8 C)     Temp Source 05/03/18 1043 Oral     SpO2 05/03/18 1043 100 %     Weight 05/03/18 1045 179 lb (81.2 kg)     Height --      Head Circumference --      Peak Flow --      Pain Score 05/03/18 1044 3     Pain Loc --      Pain Edu? --      Excl. in GC? --    Updated Vital Signs BP (!) 157/107 (BP Location: Left Arm)   Pulse (!) 112   Temp 98.2 F (36.8 C) (Oral)   Wt 179 lb (81.2 kg)   SpO2 100%   BMI 26.43 kg/m     Physical Exam  Constitutional: He is oriented to person, place, and time. He appears well-developed. No distress.  HENT:  Head: Normocephalic and atraumatic.  Eyes: Conjunctivae are normal. Right eye exhibits no discharge. Left eye exhibits no discharge.  Cardiovascular:  Irregularly irregular.  Tachycardic. 1-2+ pitting lower extremity edema.  Pulmonary/Chest: Effort normal and breath sounds normal.  Neurological: He is alert and oriented  to person, place, and time.  Psychiatric: He has a normal mood and affect. His behavior is normal.  Nursing note and vitals reviewed.  UC Treatments / Results  Labs (all labs ordered are listed, but only abnormal results are displayed) Labs Reviewed - No data to display  EKG Interpretation: Atrial fibrillation with RVR.  T wave inversion V2, V3, V4, V5.  Radiology No results found.  Procedures Procedures (including critical care time)  Medications Ordered in UC Medications - No data to display  Initial Impression / Assessment and Plan / UC Course  I have reviewed the triage vital signs and the nursing notes.  Pertinent labs & imaging results that were available during my care of the patient were reviewed by me and considered in my medical decision making (see chart for details).    67 year old male presents with new onset atrial fibrillation and resulting congestive heart failure.  Patient stable currently.  Patient was  given aspirin and will be transferred via EMS to the hospital for likely admission.  Needs echocardiogram, labs, cardiac work-up.  Final Clinical Impressions(s) / UC Diagnoses   Final diagnoses:  New onset atrial fibrillation (HCC)  Acute congestive heart failure, unspecified heart failure type [facility])   Discharge Instructions   None    ED Prescriptions    None     Controlled Substance Prescriptions [place] Controlled Substance Registry consulted? Not Applicable   Tommie Sams, DO 05/03/18 1123

## 2018-05-03 NOTE — ED Provider Notes (Signed)
[facility] Emergency Department Provider Note  ____________________________________________   First MD Initiated Contact with Patient 05/03/18 1145     (approximate)  I have reviewed the triage vital signs and the nursing notes.   HISTORY  Chief Complaint Shortness of Breath and Atrial Fibrillation   HPI Donald Molina is a 67 y.o. male who comes to the emergency department via EMS from med in urgent care for evaluation of shortness of breath.  The patient has a long-standing history of hypertension although has not taken any medications in about 5 years.  He says that for the past week he has had gradual onset slowly progressive worsening shortness of breath.  The symptoms are worse when lying flat and improved with sitting up.  Also worse with exertion.  Somewhat improved with rest.  He has had no cough.  No fevers or chills.  He has noticed some mild leg swelling.  He was given 324 mg of aspirin in urgent care.  He currently takes no medications.  His symptoms are moderate in severity.  Past Medical History:  Diagnosis Date  . Hypertension     Patient Active Problem List   Diagnosis Date Noted  . CHF (congestive heart failure) (HCC) 05/03/2018  . Atrial fibrillation with RVR (HCC)   . Acute pulmonary edema [facility])     Past Surgical History:  Procedure Laterality Date  . NO PAST SURGERIES      Prior to Admission medications   Not on File    Allergies Patient has no known allergies.  Family History  Problem Relation Age of Onset  . Cancer Mother        pancreatic  . Cervical cancer Mother   . Dementia Father   . Heart failure Father     Social History Social History   Tobacco Use  . Smoking status: Never Smoker  . Smokeless tobacco: Never Used  Substance Use Topics  . Alcohol use: Yes  . Drug use: Never    Review of Systems Constitutional: No fever/chills Eyes: No visual changes. ENT: No sore throat. Cardiovascular: Denies  chest pain. Respiratory: Positive for shortness of breath. Gastrointestinal: No abdominal pain.  No nausea, no vomiting.  No diarrhea.  No constipation. Genitourinary: Negative for dysuria. Musculoskeletal: Negative for back pain. Skin: Negative for rash. Neurological: Negative for headaches, focal weakness or numbness.   ____________________________________________   PHYSICAL EXAM:  VITAL SIGNS: ED Triage Vitals  Enc Vitals Group     BP      Pulse      Resp      Temp      Temp src      SpO2      Weight      Height      Head Circumference      Peak Flow      Pain Score      Pain Loc      Pain Edu?      Excl. in GC?     Constitutional: Alert and oriented x4 pleasant cooperative mildly uncomfortable appearing Eyes: PERRL EOMI. Head: Atraumatic. Nose: No congestion/rhinnorhea. Mouth/Throat: No trismus Neck: No stridor.  Able to lie completely flat although is uncomfortable and has some JVD Cardiovascular: Irregularly irregular and tachycardic Respiratory: Crackles at bilateral bases moving good air Gastrointestinal: Soft nontender Musculoskeletal: Legs are equal in size mild edema Neurologic:  Normal speech and language. No gross focal neurologic deficits are appreciated. Skin:  Skin is warm, dry and intact.  No rash noted. Psychiatric: Mood and affect are normal. Speech and behavior are normal.    ____________________________________________   DIFFERENTIAL includes but not limited to  Atrial fibrillation, acute coronary syndrome, fluid overload, congestive heart failure ____________________________________________   LABS (all labs ordered are listed, but only abnormal results are displayed)  Labs Reviewed  COMPREHENSIVE METABOLIC PANEL - Abnormal; Notable for the following components:      Result Value   CO2 21 (*)    Glucose, Bld 108 (*)    Calcium 8.5 (*)    All other components within normal limits  BRAIN NATRIURETIC PEPTIDE - Abnormal; Notable for  the following components:   B Natriuretic Peptide 1,561.0 (*)    All other components within normal limits  CBC WITH DIFFERENTIAL/PLATELET - Abnormal; Notable for the following components:   RBC 4.12 (*)    HCT 37.1 (*)    All other components within normal limits  BASIC METABOLIC PANEL - Abnormal; Notable for the following components:   Glucose, Bld 109 (*)    Calcium 8.5 (*)    All other components within normal limits  CBC - Abnormal; Notable for the following components:   RBC 4.14 (*)    HCT 37.3 (*)    All other components within normal limits  LIPID PANEL - Abnormal; Notable for the following components:   HDL 38 (*)    All other components within normal limits  TROPONIN I  TSH  PROTIME-INR  APTT  TROPONIN I  TROPONIN I  HEPARIN LEVEL (UNFRACTIONATED)  HEPARIN LEVEL (UNFRACTIONATED)    Lab work reviewed by me with no evidence of acute ischemia but elevated BNP consistent with new heart failure __________________________________________  EKG  ED ECG REPORT I, Merrily Brittle, the attending physician, personally viewed and interpreted this ECG.  Date: 05/03/2018 EKG Time:  Rate: 109 Rhythm: Atrial fibrillation with rapid ventricular response QRS Axis: Leftward axis Intervals: normal ST/T Wave abnormalities: Evidence of LVH with repolarization abnormalities Narrative Interpretation: no evidence of acute ischemia  ____________________________________________  RADIOLOGY  Chest x-ray reviewed by me consistent with pleural effusions and pulmonary edema  ____________________________________________   PROCEDURES  Procedure(s) performed: no  .Critical Care Performed by: Merrily Brittle, MD Authorized by: Merrily Brittle, MD   Critical care provider statement:    Critical care time (minutes):  30   Critical care time was exclusive of:  Separately billable procedures and treating other patients   Critical care was necessary to treat or prevent imminent or  life-threatening deterioration of the following conditions:  Cardiac failure   Critical care was time spent personally by me on the following activities:  Development of treatment plan with patient or surrogate, discussions with consultants, evaluation of patient's response to treatment, examination of patient, obtaining history from patient or surrogate, ordering and performing treatments and interventions, ordering and review of laboratory studies, ordering and review of radiographic studies, pulse oximetry, re-evaluation of patient's condition and review of old charts    Critical Care performed: S  Observation: no ____________________________________________   INITIAL IMPRESSION / ASSESSMENT AND PLAN / ED COURSE  Pertinent labs & imaging results that were available during my care of the patient were reviewed by me and considered in my medical decision making (see chart for details).  The patient arrives tachycardic and new onset atrial fibrillation with rapid ventricular response.  He is clearly fluid overloaded and unable to lie completely flat.  I am concerned for new onset heart failure especially considering his LVH with repolarization  abnormalities.  Chest x-ray and broad labs are pending but the patient will require inpatient admission for diuresis and management of his atrial fibrillation.  I will begin him on metoprolol now.  Following metoprolol the patient's symptoms are somewhat improved.  He is clinically fluid overloaded with elevated BNP and chest x-ray showing pleural effusions and pulmonary edema.  Given his acute pulmonary edema and A. fib RVR he requires inpatient admission for continued diuresis and echocardiogram and medication optimization.  The patient verbalizes understanding agree with the plan.  The hospitalist has graciously agreed to admit the patient to his service.      ____________________________________________   FINAL CLINICAL IMPRESSION(S) / ED  DIAGNOSES  Final diagnoses:  Atrial fibrillation with RVR (HCC)  Acute pulmonary edema (HCC)      NEW MEDICATIONS STARTED DURING THIS VISIT:  There are no discharge medications for this patient.    Note:  This document was prepared using Dragon voice recognition software and may include unintentional dictation errors.     Merrily Brittle, MD 05/04/18 1236

## 2018-05-03 NOTE — ED Triage Notes (Signed)
Pt to ED via [facility] from [facility] for shortness of breath when laying flat and new onset A Fib. Pt has PMH of HTN but is not currently taking amy medications. Pt denies chest pain or shortness of breath at this time. States that he sleeps on 2 pillows at night. Pt is in NAD at this time. Pt was given 324 mg of ASA at urgent care.  Per EMS vital signs as follows  BP: 163/113 HR: 60-120, A fib on monitor SpO2: 97% on room air.

## 2018-05-03 NOTE — Progress Notes (Signed)
Report received from Roylene Reason, RN.

## 2018-05-04 DIAGNOSIS — I5021 Acute systolic (congestive) heart failure: Secondary | ICD-10-CM

## 2018-05-04 DIAGNOSIS — I10 Essential (primary) hypertension: Secondary | ICD-10-CM

## 2018-05-04 LAB — BASIC METABOLIC PANEL
ANION GAP: 8 (ref 5–15)
BUN: 16 mg/dL (ref 6–20)
CHLORIDE: 106 mmol/L (ref 101–111)
CO2: 22 mmol/L (ref 22–32)
CREATININE: 0.75 mg/dL (ref 0.61–1.24)
Calcium: 8.5 mg/dL — ABNORMAL LOW (ref 8.9–10.3)
GFR calc non Af Amer: >60 mL/min (ref >60)
Glucose, Bld: 109 mg/dL — ABNORMAL HIGH (ref 65–99)
Potassium: 3.8 mmol/L (ref 3.5–5.1)
SODIUM: 136 mmol/L (ref 135–145)

## 2018-05-04 LAB — ECHOCARDIOGRAM COMPLETE
Height: 71 in
Weight: 2937.6 [oz_av]

## 2018-05-04 LAB — CBC
HCT: 37.3 % — ABNORMAL LOW (ref 40.0–52.0)
Hemoglobin: 13.2 g/dL (ref 13.0–18.0)
MCH: 31.8 pg (ref 26.0–34.0)
MCHC: 35.3 g/dL (ref 32.0–36.0)
MCV: 90.1 fL (ref 80.0–100.0)
PLATELETS: 202 10*3/uL (ref 150–440)
RBC: 4.14 MIL/uL — AB (ref 4.40–5.90)
RDW: 12.5 % (ref 11.5–14.5)
WBC: 6.8 10*3/uL (ref 3.8–10.6)

## 2018-05-04 LAB — LIPID PANEL
CHOL/HDL RATIO: 3.3 ratio
CHOLESTEROL: 126 mg/dL (ref 0–200)
HDL: 38 mg/dL — AB (ref >40)
LDL Cholesterol: 75 mg/dL (ref 0–99)
Triglycerides: 65 mg/dL (ref <150)
VLDL: 13 mg/dL (ref 0–40)

## 2018-05-04 LAB — PROTIME-INR
INR: 1.13
PROTHROMBIN TIME: 14.4 s (ref 11.4–15.2)

## 2018-05-04 LAB — HEPARIN LEVEL (UNFRACTIONATED): HEPARIN UNFRACTIONATED: 0.41 [IU]/mL (ref 0.30–0.70)

## 2018-05-04 MED ORDER — ADULT MULTIVITAMIN W/MINERALS CH
1.0000 | ORAL_TABLET | Freq: Every day | ORAL | Status: DC
  Administered 2018-05-05 – 2018-05-08 (×4): 1 via ORAL
  Filled 2018-05-04 (×4): qty 1

## 2018-05-04 MED ORDER — LORAZEPAM 2 MG/ML IJ SOLN
1.0000 mg | Freq: Four times a day (QID) | INTRAMUSCULAR | Status: AC | PRN
  Administered 2018-05-04: 1 mg via INTRAVENOUS
  Filled 2018-05-04: qty 1

## 2018-05-04 MED ORDER — SODIUM CHLORIDE 0.9% FLUSH: 3.0000 mL | INTRAVENOUS | Status: DC | PRN

## 2018-05-04 MED ORDER — FOLIC ACID 1 MG PO TABS
1.0000 mg | ORAL_TABLET | Freq: Every day | ORAL | Status: DC
  Administered 2018-05-05 – 2018-05-08 (×4): 1 mg via ORAL
  Filled 2018-05-04 (×4): qty 1

## 2018-05-04 MED ORDER — VITAMIN B-1 100 MG PO TABS
100.0000 mg | ORAL_TABLET | Freq: Every day | ORAL | Status: DC
  Administered 2018-05-05 – 2018-05-08 (×4): 100 mg via ORAL
  Filled 2018-05-04 (×4): qty 1

## 2018-05-04 MED ORDER — FUROSEMIDE 10 MG/ML IJ SOLN
40.0000 mg | Freq: Two times a day (BID) | INTRAMUSCULAR | Status: AC
  Administered 2018-05-04: 40 mg via INTRAVENOUS
  Filled 2018-05-04: qty 4

## 2018-05-04 MED ORDER — SODIUM CHLORIDE 0.9 % IV SOLN: 250.0000 mL | INTRAVENOUS | Status: DC | PRN

## 2018-05-04 MED ORDER — SODIUM CHLORIDE 0.9 % IV SOLN
INTRAVENOUS | Status: DC
  Administered 2018-05-05: 09:00:00 via INTRAVENOUS

## 2018-05-04 MED ORDER — THIAMINE HCL 100 MG/ML IJ SOLN: 100.0000 mg | Freq: Every day | INTRAMUSCULAR | Status: DC

## 2018-05-04 MED ORDER — LORAZEPAM 1 MG PO TABS
1.0000 mg | ORAL_TABLET | Freq: Four times a day (QID) | ORAL | Status: AC | PRN
  Administered 2018-05-05: 1 mg via ORAL
  Filled 2018-05-04 (×2): qty 1

## 2018-05-04 MED ORDER — SODIUM CHLORIDE 0.9% FLUSH
3.0000 mL | Freq: Two times a day (BID) | INTRAVENOUS | Status: DC
  Administered 2018-05-04: 3 mL via INTRAVENOUS

## 2018-05-04 MED ORDER — ASPIRIN 81 MG PO CHEW
81.0000 mg | CHEWABLE_TABLET | ORAL | Status: AC
  Administered 2018-05-05: 81 mg via ORAL
  Filled 2018-05-04: qty 1

## 2018-05-04 MED ORDER — SODIUM CHLORIDE 0.9% FLUSH
3.0000 mL | Freq: Two times a day (BID) | INTRAVENOUS | Status: DC
  Administered 2018-05-04 – 2018-05-07 (×3): 3 mL via INTRAVENOUS

## 2018-05-04 NOTE — Progress Notes (Signed)
Progress Note  Patient Name: Donald Molina Date of Encounter: 05/04/2018  Primary Cardiologist: No primary care provider on file.   Subjective   Feels better.  Stated he could lie flat last night.  Inpatient Medications    Scheduled Meds: . aspirin EC  81 mg Oral Daily  . furosemide  40 mg Intravenous BID  . metoprolol tartrate  25 mg Oral TID  . sodium chloride flush  3 mL Intravenous Q12H   Continuous Infusions: . heparin 1,200 Units/hr (05/04/18 0713)   PRN Meds: acetaminophen **OR** acetaminophen, metoprolol tartrate, ondansetron **OR** ondansetron (ZOFRAN) IV   Vital Signs    Vitals:   05/03/18 2019 05/04/18 0345 05/04/18 0807 05/04/18 0941  BP: (!) 134/94 (!) 110/94 (!) 142/97   Pulse: 92 98 70   Resp: 17  18 (!) 28  Temp: 98.5 F (36.9 C) 98.1 F (36.7 C)    TempSrc: Oral Oral    SpO2: 98% 96% 99%   Weight:      Height:        Intake/Output Summary (Last 24 hours) at 05/04/2018 1506 Last data filed at 05/04/2018 1002 Gross per 24 hour  Intake 281.6 ml  Output 950 ml  Net -668.4 ml   Filed Weights   05/03/18 1149 05/03/18 1409  Weight: 179 lb (81.2 kg) 183 lb 9.6 oz (83.3 kg)    Telemetry    Atrial fibrillation, improve rate control- Personally Reviewed  ECG    None recent  Physical Exam   GEN: No acute distress.   Neck: No JVD Cardiac:  Irregularly irregular, no murmurs, rubs, or gallops.  Respiratory: Clear to auscultation bilaterally. GI: Soft, nontender, non-distended  MS: No edema; No deformity. Neuro:  Nonfocal  Psych: Normal affect   Labs    Chemistry Recent Labs  Lab 05/03/18 1153 05/04/18 0138  NA 137 136  K 3.8 3.8  CL 108 106  CO2 21* 22  GLUCOSE 108* 109*  BUN 15 16  CREATININE 0.84 0.75  CALCIUM 8.5* 8.5*  PROT 7.3  --   ALBUMIN 3.8  --   AST 30  --   ALT 20  --   ALKPHOS 51  --   BILITOT 0.9  --   GFRNONAA >60 >60  GFRAA >60 >60  ANIONGAP 8 8     Hematology Recent Labs  Lab 05/03/18 1153  05/04/18 0138  WBC 6.7 6.8  RBC 4.12* 4.14*  HGB 13.3 13.2  HCT 37.1* 37.3*  MCV 90.1 90.1  MCH 32.4 31.8  MCHC 36.0 35.3  RDW 12.4 12.5  PLT 216 202    Cardiac Enzymes Recent Labs  Lab 05/03/18 1153 05/03/18 1506 05/03/18 1851  TROPONINI <0.03 <0.03 <0.03   No results for input(s): TROPIPOC in the last 168 hours.   BNP Recent Labs  Lab 05/03/18 1153  BNP 1,561.0*     DDimer No results for input(s): DDIMER in the last 168 hours.   Radiology    Dg Chest 2 View  Result Date: 05/03/2018 CLINICAL DATA:  New dyspnea EXAM: CHEST - 2 VIEW COMPARISON:  10/04/2011 chest radiograph. FINDINGS: Stable cardiomediastinal silhouette with mild cardiomegaly. No pneumothorax. Hyperinflated lungs. Small bilateral pleural effusions. Mild pulmonary edema. No acute consolidative airspace disease. IMPRESSION: 1. Mild congestive heart failure with small bilateral pleural effusions. 2. Hyperinflated lungs, cannot exclude COPD. Electronically Signed   By: Delbert Phenix M.D.   On: 05/03/2018 12:16    Cardiac Studies   Echocardiogram revealed ejection fraction 25  to 30%  Patient Profile     67 y.o. male acute systolic heart failure and atrial fibrillation  Assessment & Plan    1) atrial fibrillation: Rate control has improved.  Ventricular rates consistently below 100.  Continue current dose of metoprolol.  2) acute systolic heart failure: Need to determine etiology.  Could be from tachycardia induced cardiomyopathy.  Another possibility would be coronary artery disease.  Given his age, we will plan for right and left heart cath to evaluate pulmonary pressures and evaluate for obstructive coronary artery disease.  The procedure was explained to the patient and all questions were answered.  The family was present as well.  He is agreeable.  3) hypertension: Continue beta-blocker for now.  Would like to add ACE inhibitor vs. Entresto after his heart cath to help his LV systolic dysfunction.   Blood pressure should tolerate at least a low-dose ACE inhibitor.  For questions or updates, please contact [facility] Please consult [email or web address] for contact info under Cardiology/STEMI.      Signed, Lance Muss, MD  05/04/2018, 3:06 PM

## 2018-05-04 NOTE — H&P (View-Only) (Signed)
Progress Note  Patient Name: Donald Molina Date of Encounter: 05/04/2018  Primary Cardiologist: No primary care provider on file.   Subjective   Feels better.  Stated he could lie flat last night.  Inpatient Medications    Scheduled Meds: . aspirin EC  81 mg Oral Daily  . furosemide  40 mg Intravenous BID  . metoprolol tartrate  25 mg Oral TID  . sodium chloride flush  3 mL Intravenous Q12H   Continuous Infusions: . heparin 1,200 Units/hr (05/04/18 0713)   PRN Meds: acetaminophen **OR** acetaminophen, metoprolol tartrate, ondansetron **OR** ondansetron (ZOFRAN) IV   Vital Signs    Vitals:   05/03/18 2019 05/04/18 0345 05/04/18 0807 05/04/18 0941  BP: (!) 134/94 (!) 110/94 (!) 142/97   Pulse: 92 98 70   Resp: 17  18 (!) 28  Temp: 98.5 F (36.9 C) 98.1 F (36.7 C)    TempSrc: Oral Oral    SpO2: 98% 96% 99%   Weight:      Height:        Intake/Output Summary (Last 24 hours) at 05/04/2018 1506 Last data filed at 05/04/2018 1002 Gross per 24 hour  Intake 281.6 ml  Output 950 ml  Net -668.4 ml   Filed Weights   05/03/18 1149 05/03/18 1409  Weight: 179 lb (81.2 kg) 183 lb 9.6 oz (83.3 kg)    Telemetry    Atrial fibrillation, improve rate control- Personally Reviewed  ECG    None recent  Physical Exam   GEN: No acute distress.   Neck: No JVD Cardiac:  Irregularly irregular, no murmurs, rubs, or gallops.  Respiratory: Clear to auscultation bilaterally. GI: Soft, nontender, non-distended  MS: No edema; No deformity. Neuro:  Nonfocal  Psych: Normal affect   Labs    Chemistry Recent Labs  Lab 05/03/18 1153 05/04/18 0138  NA 137 136  K 3.8 3.8  CL 108 106  CO2 21* 22  GLUCOSE 108* 109*  BUN 15 16  CREATININE 0.84 0.75  CALCIUM 8.5* 8.5*  PROT 7.3  --   ALBUMIN 3.8  --   AST 30  --   ALT 20  --   ALKPHOS 51  --   BILITOT 0.9  --   GFRNONAA >60 >60  GFRAA >60 >60  ANIONGAP 8 8     Hematology Recent Labs  Lab 05/03/18 1153  05/04/18 0138  WBC 6.7 6.8  RBC 4.12* 4.14*  HGB 13.3 13.2  HCT 37.1* 37.3*  MCV 90.1 90.1  MCH 32.4 31.8  MCHC 36.0 35.3  RDW 12.4 12.5  PLT 216 202    Cardiac Enzymes Recent Labs  Lab 05/03/18 1153 05/03/18 1506 05/03/18 1851  TROPONINI <0.03 <0.03 <0.03   No results for input(s): TROPIPOC in the last 168 hours.   BNP Recent Labs  Lab 05/03/18 1153  BNP 1,561.0*     DDimer No results for input(s): DDIMER in the last 168 hours.   Radiology    Dg Chest 2 View  Result Date: 05/03/2018 CLINICAL DATA:  New dyspnea EXAM: CHEST - 2 VIEW COMPARISON:  10/04/2011 chest radiograph. FINDINGS: Stable cardiomediastinal silhouette with mild cardiomegaly. No pneumothorax. Hyperinflated lungs. Small bilateral pleural effusions. Mild pulmonary edema. No acute consolidative airspace disease. IMPRESSION: 1. Mild congestive heart failure with small bilateral pleural effusions. 2. Hyperinflated lungs, cannot exclude COPD. Electronically Signed   By: Delbert Phenix M.D.   On: 05/03/2018 12:16    Cardiac Studies   Echocardiogram revealed ejection fraction 25  to 30%  Patient Profile     67 y.o. male acute systolic heart failure and atrial fibrillation  Assessment & Plan    1) atrial fibrillation: Rate control has improved.  Ventricular rates consistently below 100.  Continue current dose of metoprolol.  2) acute systolic heart failure: Need to determine etiology.  Could be from tachycardia induced cardiomyopathy.  Another possibility would be coronary artery disease.  Given his age, we will plan for right and left heart cath to evaluate pulmonary pressures and evaluate for obstructive coronary artery disease.  The procedure was explained to the patient and all questions were answered.  The family was present as well.  He is agreeable.  3) hypertension: Continue beta-blocker for now.  Would like to add ACE inhibitor vs. Entresto after his heart cath to help his LV systolic dysfunction.   Blood pressure should tolerate at least a low-dose ACE inhibitor.  For questions or updates, please contact [facility] Please consult [email or web address] for contact info under Cardiology/STEMI.      Signed, Lance Muss, MD  05/04/2018, 3:06 PM

## 2018-05-04 NOTE — Progress Notes (Signed)
ANTICOAGULATION CONSULT NOTE - Initial Consult  Pharmacy Consult for heparin drip  Indication: atrial fibrillation  No Known Allergies  Patient Measurements: Height: 5\' 11"  (180.3 cm) Weight: 183 lb 9.6 oz (83.3 kg) IBW/kg (Calculated) : 75.3 Heparin Dosing Weight: 81.2 kg   Vital Signs: Temp: 98.5 F (36.9 C) (06/08 2019) Temp Source: Oral (06/08 2019) BP: 134/94 (06/08 2019) Pulse Rate: 92 (06/08 2019)  Labs: Recent Labs    05/03/18 1153 05/03/18 1506 05/03/18 1851 05/04/18 0138  HGB 13.3  --   --  13.2  HCT 37.1*  --   --  37.3*  PLT 216  --   --  202  APTT 30  --   --   --   LABPROT 14.0  --   --   --   INR 1.09  --   --   --   HEPARINUNFRC  --   --  0.39 0.41  CREATININE 0.84  --   --   --   TROPONINI <0.03 <0.03 <0.03  --     Estimated Creatinine Clearance: 90.9 mL/min (by C-G formula based on SCr of 0.84 mg/dL).   Medical History: Past Medical History:  Diagnosis Date  . Hypertension    Assessment: 67 year old male presents to ED with SOB and in new onset atrial fibrillation. Has not been on anticoagulation PTA.   Goal of Therapy:  Heparin level 0.3-0.7 units/ml Monitor platelets by anticoagulation protocol: Yes   Plan:  Will give heparin bolus 4000 units followed by heparin drip 1200 units/hr. Will check first HL 6 hours from start of heparin drip.  APTT and PT/INR ordered.  Will monitor H&H and daily heparin levels.   6/8:  HL @ 19:00 = 0.39 Will continue this pt on current rate and recheck HL on 6/9 @ 0100.   06/09 @ 0138 HL 0.41 therapeutic. Will continue current rate 1200 units/hr and will recheck HL w/ am labs. CBC stable.  Thomasene Ripple, PharmD, BCPS Clinical Pharmacist 05/04/2018

## 2018-05-04 NOTE — Progress Notes (Signed)
[facility] - [place] at [facility]   PATIENT NAME: Donald Molina    MR#:  [id number]  DATE OF BIRTH:  02-02-1951  SUBJECTIVE: Patient admitted for new onset A. fib, congestive heart failure.  Patient had echocardiogram but results are pending.  He says he is feeling much better, denies any shortness of breath.  Heart rate is in 70s.  CHIEF COMPLAINT:   Chief Complaint  Patient presents with  . Shortness of Breath  . Atrial Fibrillation    REVIEW OF SYSTEMS:   ROS CONSTITUTIONAL: No fever, fatigue or weakness.  EYES: No blurred or double vision.  EARS, NOSE, AND THROAT: No tinnitus or ear pain.  RESPIRATORY: No cough, shortness of breath, wheezing or hemoptysis.  CARDIOVASCULAR: No chest pain, orthopnea, edema.  GASTROINTESTINAL: No nausea, vomiting, diarrhea or abdominal pain.  GENITOURINARY: No dysuria, hematuria.  ENDOCRINE: No polyuria, nocturia,  HEMATOLOGY: No anemia, easy bruising or bleeding SKIN: No rash or lesion. MUSCULOSKELETAL: No joint pain or arthritis.   NEUROLOGIC: No tingling, numbness, weakness.  PSYCHIATRY: No anxiety or depression.   DRUG ALLERGIES:  No Known Allergies  VITALS:  Blood pressure (!) 142/97, pulse 70, temperature 98.1 F (36.7 C), temperature source Oral, resp. rate (!) 28, height 5\' 11"  (1.803 m), weight 83.3 kg (183 lb 9.6 oz), SpO2 99 %.  PHYSICAL EXAMINATION:  GENERAL:  67 y.o.-year-old patient lying in the bed with no acute distress.  EYES: Pupils equal, round, reactive to light and accommodation. No scleral icterus. Extraocular muscles intact.  HEENT: Head atraumatic, normocephalic. Oropharynx and nasopharynx clear.  NECK:  Supple, no jugular venous distention. No thyroid enlargement, no tenderness.  LUNGS: Normal breath sounds bilaterally, no wheezing, rales,rhonchi or crepitation. No use of accessory muscles of respiration.  CARDIOVASCULAR: S1, S2 irregular.  No murmurs, rubs, or gallops.  ABDOMEN:  Soft, nontender, nondistended. Bowel sounds present. No organomegaly or mass.  EXTREMITIES: No pedal edema, cyanosis, or clubbing.  NEUROLOGIC: Cranial nerves II through XII are intact. Muscle strength 5/5 in all extremities. Sensation intact. Gait not checked.  PSYCHIATRIC: The patient is alert and oriented x 3.  SKIN: No obvious rash, lesion, or ulcer.    LABORATORY PANEL:   CBC Recent Labs  Lab 05/04/18 0138  WBC 6.8  HGB 13.2  HCT 37.3*  PLT 202   ------------------------------------------------------------------------------------------------------------------  Chemistries  Recent Labs  Lab 05/03/18 1153 05/04/18 0138  NA 137 136  K 3.8 3.8  CL 108 106  CO2 21* 22  GLUCOSE 108* 109*  BUN 15 16  CREATININE 0.84 0.75  CALCIUM 8.5* 8.5*  AST 30  --   ALT 20  --   ALKPHOS 51  --   BILITOT 0.9  --    ------------------------------------------------------------------------------------------------------------------  Cardiac Enzymes Recent Labs  Lab 05/03/18 1851  TROPONINI <0.03   ------------------------------------------------------------------------------------------------------------------  RADIOLOGY:  Dg Chest 2 View  Result Date: 05/03/2018 CLINICAL DATA:  New dyspnea EXAM: CHEST - 2 VIEW COMPARISON:  10/04/2011 chest radiograph. FINDINGS: Stable cardiomediastinal silhouette with mild cardiomegaly. No pneumothorax. Hyperinflated lungs. Small bilateral pleural effusions. Mild pulmonary edema. No acute consolidative airspace disease. IMPRESSION: 1. Mild congestive heart failure with small bilateral pleural effusions. 2. Hyperinflated lungs, cannot exclude COPD. Electronically Signed   By: Delbert Phenix M.D.   On: 05/03/2018 12:16    EKG:   Orders placed or performed during the hospital encounter of 05/03/18  . ED EKG  . ED EKG  . EKG 12-Lead  . EKG 12-Lead  ASSESSMENT AND PLAN:  #1 .ewly diagnosed atrial fibrillation: Patient is on metoprolol for rate  control, Chads Vascat least 2 so he is on heparin drip.  Continue to monitor on telemetry, follow echocardiogram. 2.  Congestive heart failure: Acute: Unclear whether acute systolic or diastolic heart failure.  Continue IV Lasix, follow echocardiogram results.  Likely discharge tomorrow.      All the records are reviewed and case discussed with Care Management/Social Workerr. Management plans discussed with the patient, family and they are in agreement.  CODE STATUS: Full code  TOTAL TIME TAKING CARE OF THIS PATIENT: 38 minutes.   POSSIBLE D/C IN 1-2 DAYS, DEPENDING ON CLINICAL CONDITION.   Katha Hamming M.D on 05/04/2018 at 11:16 AM  Between 7am to 6pm - Pager - [phone]  After 6pm go to [email or web address] - password [email or web address]  [facility]  Office  [phone]  CC: Primary care physician; Patient, No Pcp Per   Note: This dictation was prepared with Dragon dictation along with smaller phrase technology. Any transcriptional errors that result from this process are unintentional.

## 2018-05-05 ENCOUNTER — Encounter: Payer: Self-pay | Admitting: *Deleted

## 2018-05-05 ENCOUNTER — Encounter
Admission: EM | Disposition: A | Discharge status: Home Health | Payer: Self-pay | Source: Home / Self Care | Attending: Internal Medicine

## 2018-05-05 DIAGNOSIS — I5021 Acute systolic (congestive) heart failure: Secondary | ICD-10-CM

## 2018-05-05 DIAGNOSIS — I4891 Unspecified atrial fibrillation: Secondary | ICD-10-CM

## 2018-05-05 HISTORY — PX: RIGHT/LEFT HEART CATH AND CORONARY ANGIOGRAPHY: CATH118266

## 2018-05-05 LAB — HEPARIN LEVEL (UNFRACTIONATED): HEPARIN UNFRACTIONATED: 0.46 [IU]/mL (ref 0.30–0.70)

## 2018-05-05 SURGERY — RIGHT/LEFT HEART CATH AND CORONARY ANGIOGRAPHY
Anesthesia: Moderate Sedation

## 2018-05-05 MED ORDER — SODIUM CHLORIDE 0.9% FLUSH: 3.0000 mL | INTRAVENOUS | Status: DC | PRN

## 2018-05-05 MED ORDER — LIDOCAINE HCL (PF) 1 % IJ SOLN
INTRAMUSCULAR | Status: DC | PRN
  Administered 2018-05-05: 6 mL

## 2018-05-05 MED ORDER — HEPARIN SODIUM (PORCINE) 1000 UNIT/ML IJ SOLN
INTRAMUSCULAR | Status: DC | PRN
  Administered 2018-05-05: 4000 [IU] via INTRAVENOUS

## 2018-05-05 MED ORDER — FUROSEMIDE 10 MG/ML IJ SOLN
40.0000 mg | Freq: Two times a day (BID) | INTRAMUSCULAR | Status: DC
  Administered 2018-05-05 – 2018-05-08 (×5): 40 mg via INTRAVENOUS
  Filled 2018-05-05 (×5): qty 4

## 2018-05-05 MED ORDER — HEPARIN (PORCINE) IN NACL 1000-0.9 UT/500ML-% IV SOLN
INTRAVENOUS | Status: AC
Start: 2018-05-05 — End: ?
  Filled 2018-05-05: qty 500

## 2018-05-05 MED ORDER — POTASSIUM CHLORIDE CRYS ER 20 MEQ PO TBCR
40.0000 meq | EXTENDED_RELEASE_TABLET | Freq: Once | ORAL | Status: AC
  Administered 2018-05-05: 40 meq via ORAL
  Filled 2018-05-05: qty 2

## 2018-05-05 MED ORDER — LIDOCAINE HCL (PF) 1 % IJ SOLN
INTRAMUSCULAR | Status: AC
  Filled 2018-05-05: qty 30

## 2018-05-05 MED ORDER — FENTANYL CITRATE (PF) 100 MCG/2ML IJ SOLN
INTRAMUSCULAR | Status: AC
  Filled 2018-05-05: qty 2

## 2018-05-05 MED ORDER — HEPARIN SODIUM (PORCINE) 1000 UNIT/ML IJ SOLN
INTRAMUSCULAR | Status: AC
  Filled 2018-05-05: qty 1

## 2018-05-05 MED ORDER — IOPAMIDOL (ISOVUE-300) INJECTION 61%
INTRAVENOUS | Status: DC | PRN
  Administered 2018-05-05: 50 mL via INTRA_ARTERIAL

## 2018-05-05 MED ORDER — VERAPAMIL HCL 2.5 MG/ML IV SOLN
INTRAVENOUS | Status: AC
  Filled 2018-05-05: qty 2

## 2018-05-05 MED ORDER — FENTANYL CITRATE (PF) 100 MCG/2ML IJ SOLN
INTRAMUSCULAR | Status: DC | PRN
  Administered 2018-05-05: 25 ug via INTRAVENOUS

## 2018-05-05 MED ORDER — SODIUM CHLORIDE 0.9% FLUSH
3.0000 mL | Freq: Two times a day (BID) | INTRAVENOUS | Status: DC
  Administered 2018-05-06 – 2018-05-07 (×3): 3 mL via INTRAVENOUS

## 2018-05-05 MED ORDER — HEPARIN (PORCINE) IN NACL 100-0.45 UNIT/ML-% IJ SOLN
1200.0000 [IU]/h | INTRAMUSCULAR | Status: DC
  Administered 2018-05-05: 1200 [IU]/h via INTRAVENOUS
  Filled 2018-05-05: qty 250

## 2018-05-05 MED ORDER — AMIODARONE HCL 200 MG PO TABS
400.0000 mg | ORAL_TABLET | Freq: Two times a day (BID) | ORAL | Status: DC
  Administered 2018-05-05 – 2018-05-08 (×7): 400 mg via ORAL
  Filled 2018-05-05 (×7): qty 2

## 2018-05-05 MED ORDER — SODIUM CHLORIDE 0.9 % IV SOLN: 250.0000 mL | INTRAVENOUS | Status: DC | PRN

## 2018-05-05 MED ORDER — MIDAZOLAM HCL 2 MG/2ML IJ SOLN
INTRAMUSCULAR | Status: AC
Start: 2018-05-05 — End: ?
  Filled 2018-05-05: qty 2

## 2018-05-05 MED ORDER — MIDAZOLAM HCL 2 MG/2ML IJ SOLN
INTRAMUSCULAR | Status: DC | PRN
  Administered 2018-05-05: 1 mg via INTRAVENOUS

## 2018-05-05 SURGICAL SUPPLY — 13 items
BAND COMPRESS 30CM LRG LONG (HEMOSTASIS) ×1
BAND ZEPHYR COMPRESS 30 LONG (HEMOSTASIS) ×2 IMPLANT
CATH BALLN WEDGE 5F 110CM (CATHETERS) ×3 IMPLANT
CATH INFINITI 5FR ANG PIGTAIL (CATHETERS) ×3 IMPLANT
CATH INFINITI 5FR JK (CATHETERS) ×3 IMPLANT
GUIDEWIRE .025 260CM (WIRE) ×3 IMPLANT
KIT MANI 3VAL PERCEP (MISCELLANEOUS) ×3 IMPLANT
KIT RIGHT HEART (MISCELLANEOUS) ×3 IMPLANT
NEEDLE PERC 21GX4CM (NEEDLE) ×3 IMPLANT
PACK CARDIAC CATH (CUSTOM PROCEDURE TRAY) ×3 IMPLANT
SHEATH RAIN 4/5FR (SHEATH) ×3 IMPLANT
SHEATH RAIN RADIAL 21G 6FR (SHEATH) ×3 IMPLANT
WIRE ROSEN-J .035X260CM (WIRE) ×3 IMPLANT

## 2018-05-05 NOTE — Progress Notes (Signed)
[facility] - [place] at [facility]   PATIENT NAME: Donald Molina    MR#:  [id number]  DATE OF BIRTH:  1951-09-08    CHIEF COMPLAINT:   Chief Complaint  Patient presents with  . Shortness of Breath  . Atrial Fibrillation   shortness of breath continues to be present. Fatigue. Not on oxygen. Afebrile. Lower extremity swelling is improving  REVIEW OF SYSTEMS:   ROS CONSTITUTIONAL: No fever, fatigue or weakness.  EYES: No blurred or double vision.  EARS, NOSE, AND THROAT: No tinnitus or ear pain.  RESPIRATORY: No cough, shortness of breath, wheezing or hemoptysis.  CARDIOVASCULAR: No chest pain, orthopnea, edema.  GASTROINTESTINAL: No nausea, vomiting, diarrhea or abdominal pain.  GENITOURINARY: No dysuria, hematuria.  ENDOCRINE: No polyuria, nocturia,  HEMATOLOGY: No anemia, easy bruising or bleeding SKIN: No rash or lesion. MUSCULOSKELETAL: No joint pain or arthritis.   NEUROLOGIC: No tingling, numbness, weakness.  PSYCHIATRY: No anxiety or depression.   DRUG ALLERGIES:  No Known Allergies  VITALS:  Blood pressure 118/74, pulse 68, temperature 98.3 F (36.8 C), resp. rate 16, height 5\' 11"  (1.803 m), weight 83 kg (183 lb), SpO2 98 %.  PHYSICAL EXAMINATION:  GENERAL:  68 y.o.-year-old patient lying in the bed with no acute distress.  EYES: Pupils equal, round, reactive to light and accommodation. No scleral icterus. Extraocular muscles intact.  HEENT: Head atraumatic, normocephalic. Oropharynx and nasopharynx clear.  NECK:  Supple, no jugular venous distention. No thyroid enlargement, no tenderness.  LUNGS: Normal breath sounds bilaterally, no wheezing, rales,rhonchi or crepitation. No use of accessory muscles of respiration.  CARDIOVASCULAR: S1, S2 irregular.  No murmurs, rubs, or gallops.  ABDOMEN: Soft, nontender, nondistended. Bowel sounds present. No organomegaly or mass.  EXTREMITIES: No cyanosis, or clubbing.  Bilateral lower [employer]  edema NEUROLOGIC: Cranial nerves II through XII are intact. Muscle strength 5/5 in all extremities. Sensation intact. Gait not checked.  PSYCHIATRIC: The patient is alert and oriented x 3.  SKIN: No obvious rash, lesion, or ulcer.    LABORATORY PANEL:   CBC Recent Labs  Lab 05/04/18 0138  WBC 6.8  HGB 13.2  HCT 37.3*  PLT 202   ------------------------------------------------------------------------------------------------------------------  Chemistries  Recent Labs  Lab 05/03/18 1153 05/04/18 0138  NA 137 136  K 3.8 3.8  CL 108 106  CO2 21* 22  GLUCOSE 108* 109*  BUN 15 16  CREATININE 0.84 0.75  CALCIUM 8.5* 8.5*  AST 30  --   ALT 20  --   ALKPHOS 51  --   BILITOT 0.9  --    ------------------------------------------------------------------------------------------------------------------  Cardiac Enzymes Recent Labs  Lab 05/03/18 1851  TROPONINI <0.03   ------------------------------------------------------------------------------------------------------------------  RADIOLOGY:  No results found.  EKG:   Orders placed or performed during the hospital encounter of 05/03/18  . ED EKG  . ED EKG  . EKG 12-Lead  . EKG 12-Lead  . EKG  . EKG 12-Lead  . EKG 12-Lead  . EKG 12-Lead  . EKG 12-Lead    ASSESSMENT AND PLAN:   *Acute on chronic systolic congestive heart failure. Ejection fraction 25 to 30%. IV Lasix 40 BID. Moderate input and output. Repeat BMP in the morning. Cardiac catheterization with normal coronaries.  *New onset atrial fibrillation on metoprolol. Amiodarone added. Continue heparin drip. Start NOAC tomorrow.  *Hypertension. Continue metoprolol  All the records are reviewed and case discussed with Care Management/Social Workerr. Management plans discussed with the patient, family and they are in agreement.  CODE STATUS: Full code  TOTAL TIME TAKING CARE OF THIS PATIENT: 30 minutes.   POSSIBLE D/C IN 1-2 DAYS, DEPENDING ON  CLINICAL CONDITION.  Molinda Bailiff Itzel Mckibbin M.D on 05/05/2018 at 3:22 PM  Between 7am to 6pm - Pager - [phone]  After 6pm go to [email or web address] - password [email or web address]  [facility]  Office  [phone]  CC: Primary care physician; Patient, No Pcp Per   Note: This dictation was prepared with Dragon dictation along with smaller phrase technology. Any transcriptional errors that result from this process are unintentional.

## 2018-05-05 NOTE — Plan of Care (Signed)
  Problem: Clinical Measurements: Goal: Diagnostic test results will improve Outcome: Progressing Goal: Cardiovascular complication will be avoided Outcome: Progressing   

## 2018-05-05 NOTE — Progress Notes (Signed)
Transport to Cardiac Cath lab

## 2018-05-05 NOTE — Progress Notes (Signed)
Per Dr.Arida, Pt to restart heparin gtt 8 hrs post sheath removal, per pharmacy consult. Order placed already per MD in active orders in pharmacy consult

## 2018-05-05 NOTE — Progress Notes (Signed)
Notified MD of pt stating that drinks alcohol, and that Friday was his last drink. Orders placed.

## 2018-05-05 NOTE — Progress Notes (Signed)
ANTICOAGULATION CONSULT NOTE - Initial Consult  Pharmacy Consult for heparin drip  Indication: atrial fibrillation  No Known Allergies  Patient Measurements: Height: 5\' 11"  (180.3 cm) Weight: 183 lb 9.6 oz (83.3 kg) IBW/kg (Calculated) : 75.3 Heparin Dosing Weight: 81.2 kg   Vital Signs: Temp: 98 F (36.7 C) (06/10 0433) Temp Source: Oral (06/10 0433) BP: 111/88 (06/10 0433) Pulse Rate: 95 (06/10 0433)  Labs: Recent Labs    05/03/18 1153 05/03/18 1506 05/03/18 1851 05/04/18 0138 05/04/18 1926 05/05/18 0533  HGB 13.3  --   --  13.2  --   --   HCT 37.1*  --   --  37.3*  --   --   PLT 216  --   --  202  --   --   APTT 30  --   --   --   --   --   LABPROT 14.0  --   --   --  14.4  --   INR 1.09  --   --   --  1.13  --   HEPARINUNFRC  --   --  0.39 0.41  --  0.46  CREATININE 0.84  --   --  0.75  --   --   TROPONINI <0.03 <0.03 <0.03  --   --   --     Estimated Creatinine Clearance: 95.4 mL/min (by C-G formula based on SCr of 0.75 mg/dL).   Medical History: Past Medical History:  Diagnosis Date  . Hypertension    Assessment: 67 year old male presents to ED with SOB and in new onset atrial fibrillation. Has not been on anticoagulation PTA.   Goal of Therapy:  Heparin level 0.3-0.7 units/ml Monitor platelets by anticoagulation protocol: Yes   Plan:  06/10 @ 0533 HL 0.46 therapeutic. Will continue current rate and will recheck HL w/ am labs.  Thomasene Ripple, PharmD, BCPS Clinical Pharmacist 05/05/2018

## 2018-05-05 NOTE — Progress Notes (Signed)
Entered incorrect Access time for brachial access site.  Correct time is 9:42

## 2018-05-05 NOTE — Progress Notes (Addendum)
ANTICOAGULATION CONSULT NOTE - Initial Consult  Pharmacy Consult for heparin drip  Indication: atrial fibrillation  No Known Allergies  Patient Measurements: Height: 5\' 11"  (180.3 cm) Weight: 183 lb (83 kg) IBW/kg (Calculated) : 75.3 Heparin Dosing Weight: 81.2 kg   Vital Signs: Temp: 98.7 F (37.1 C) (06/10 0852) Temp Source: Oral (06/10 0852) BP: 123/83 (06/10 1115) Pulse Rate: 79 (06/10 1115)  Labs: Recent Labs    05/03/18 1153 05/03/18 1506 05/03/18 1851 05/04/18 0138 05/04/18 1926 05/05/18 0533  HGB 13.3  --   --  13.2  --   --   HCT 37.1*  --   --  37.3*  --   --   PLT 216  --   --  202  --   --   APTT 30  --   --   --   --   --   LABPROT 14.0  --   --   --  14.4  --   INR 1.09  --   --   --  1.13  --   HEPARINUNFRC  --   --  0.39 0.41  --  0.46  CREATININE 0.84  --   --  0.75  --   --   TROPONINI <0.03 <0.03 <0.03  --   --   --     Estimated Creatinine Clearance: 95.4 mL/min (by C-G formula based on SCr of 0.75 mg/dL).   Medical History: Past Medical History:  Diagnosis Date  . Hypertension    Assessment: 67 year old male presents to ED with SOB and in new onset atrial fibrillation. Has not been on anticoagulation PTA.   Goal of Therapy:  Heparin level 0.3-0.7 units/ml Monitor platelets by anticoagulation protocol: Yes   Plan:  06/10 @ 0533 HL 0.46 therapeutic. Will continue current rate and will recheck HL w/ am labs.  06/10:  Heparin was held this morning and the patient underwent right and left heart cath to evaluate pulmonary pressures and evaluate for obstructive coronary artery disease. Dr Kirke Corin gave 4000 units IV heparin at approximately 1000am and wants to resume the heparin drip 8 hours after sheath removal at 1030am. Because the former rate was therapeutic we will resume that rate at 1200 units/hr and check the heparin level 6 hours after drip is re-started.  Burnis Medin, PharmD Clinical Pharmacist 05/05/2018

## 2018-05-05 NOTE — Interval H&P Note (Signed)
History and Physical Interval Note:  05/05/2018 9:31 AM  Donald Molina  has presented today for surgery, with the diagnosis of acute systolic heart failure  The various methods of treatment have been discussed with the patient and family. After consideration of risks, benefits and other options for treatment, the patient has consented to  Procedure(s): RIGHT/LEFT HEART CATH AND CORONARY ANGIOGRAPHY (N/A) as a surgical intervention .  The patient's history has been reviewed, patient examined, no change in status, stable for surgery.  I have reviewed the patient's chart and labs.  Questions were answered to the patient's satisfaction.     Lorine Bears

## 2018-05-06 DIAGNOSIS — I5043 Acute on chronic combined systolic (congestive) and diastolic (congestive) heart failure: Secondary | ICD-10-CM

## 2018-05-06 LAB — BASIC METABOLIC PANEL
Anion gap: 8 (ref 5–15)
Anion gap: 11 (ref 5–15)
BUN: 21 mg/dL — ABNORMAL HIGH (ref 6–20)
BUN: 20 mg/dL (ref 6–20)
CHLORIDE: 102 mmol/L (ref 101–111)
CO2: 23 mmol/L (ref 22–32)
CO2: 23 mmol/L (ref 22–32)
CREATININE: 0.82 mg/dL (ref 0.61–1.24)
CREATININE: 0.86 mg/dL (ref 0.61–1.24)
Calcium: 8.8 mg/dL — ABNORMAL LOW (ref 8.9–10.3)
Calcium: 9.1 mg/dL (ref 8.9–10.3)
Chloride: 105 mmol/L (ref 101–111)
GFR calc Af Amer: >60 mL/min (ref >60)
GFR calc non Af Amer: >60 mL/min (ref >60)
GLUCOSE: 134 mg/dL — AB (ref 65–99)
Glucose, Bld: 110 mg/dL — ABNORMAL HIGH (ref 65–99)
POTASSIUM: 3.4 mmol/L — AB (ref 3.5–5.1)
Potassium: 3.8 mmol/L (ref 3.5–5.1)
SODIUM: 136 mmol/L (ref 135–145)
Sodium: 136 mmol/L (ref 135–145)

## 2018-05-06 LAB — HEPARIN LEVEL (UNFRACTIONATED): HEPARIN UNFRACTIONATED: 0.38 [IU]/mL (ref 0.30–0.70)

## 2018-05-06 MED ORDER — POTASSIUM CHLORIDE CRYS ER 20 MEQ PO TBCR
40.0000 meq | EXTENDED_RELEASE_TABLET | ORAL | Status: AC
  Administered 2018-05-06 (×2): 40 meq via ORAL
  Filled 2018-05-06 (×2): qty 2

## 2018-05-06 MED ORDER — SODIUM CHLORIDE 0.9 % IV SOLN
INTRAVENOUS | Status: DC
  Administered 2018-05-07: 07:00:00 via INTRAVENOUS

## 2018-05-06 MED ORDER — APIXABAN 5 MG PO TABS
5.0000 mg | ORAL_TABLET | Freq: Two times a day (BID) | ORAL | Status: DC
  Administered 2018-05-06 – 2018-05-08 (×5): 5 mg via ORAL
  Filled 2018-05-06 (×6): qty 1

## 2018-05-06 MED ORDER — SACUBITRIL-VALSARTAN 24-26 MG PO TABS
1.0000 | ORAL_TABLET | Freq: Two times a day (BID) | ORAL | Status: DC
  Administered 2018-05-06: 1 via ORAL
  Filled 2018-05-06 (×2): qty 1

## 2018-05-06 MED ORDER — METOPROLOL SUCCINATE ER 25 MG PO TB24
75.0000 mg | ORAL_TABLET | Freq: Every day | ORAL | Status: DC
  Administered 2018-05-06: 75 mg via ORAL
  Filled 2018-05-06: qty 3

## 2018-05-06 NOTE — Progress Notes (Signed)
ANTICOAGULATION CONSULT NOTE - Initial Consult  Pharmacy Consult for heparin drip  Indication: atrial fibrillation  No Known Allergies  Patient Measurements: Height: 5\' 11"  (180.3 cm) Weight: 183 lb (83 kg) IBW/kg (Calculated) : 75.3 Heparin Dosing Weight: 81.2 kg   Vital Signs: Temp: 98 F (36.7 C) (06/10 1914) Temp Source: Oral (06/10 1914) BP: 109/82 (06/10 1914) Pulse Rate: 87 (06/10 1914)  Labs: Recent Labs    05/03/18 1153 05/03/18 1506  05/03/18 1851 05/04/18 0138 05/04/18 1926 05/05/18 0533 05/06/18 0221  HGB 13.3  --   --   --  13.2  --   --   --   HCT 37.1*  --   --   --  37.3*  --   --   --   PLT 216  --   --   --  202  --   --   --   APTT 30  --   --   --   --   --   --   --   LABPROT 14.0  --   --   --   --  14.4  --   --   INR 1.09  --   --   --   --  1.13  --   --   HEPARINUNFRC  --   --    < > 0.39 0.41  --  0.46 0.38  CREATININE 0.84  --   --   --  0.75  --   --  0.82  TROPONINI <0.03 <0.03  --  <0.03  --   --   --   --    < > = values in this interval not displayed.    Estimated Creatinine Clearance: 93.1 mL/min (by C-G formula based on SCr of 0.82 mg/dL).   Medical History: Past Medical History:  Diagnosis Date  . Hypertension    Assessment: 67 year old male presents to ED with SOB and in new onset atrial fibrillation. Has not been on anticoagulation PTA.   Goal of Therapy:  Heparin level 0.3-0.7 units/ml Monitor platelets by anticoagulation protocol: Yes   Plan:  06/10 @ 0533 HL 0.46 therapeutic. Will continue current rate and will recheck HL w/ am labs.  06/10:  Heparin was held this morning and the patient underwent right and left heart cath to evaluate pulmonary pressures and evaluate for obstructive coronary artery disease. Dr Kirke Corin gave 4000 units IV heparin at approximately 1000am and wants to resume the heparin drip 8 hours after sheath removal at 1030am. Because the former rate was therapeutic we will resume that rate at 1200  units/hr and check the heparin level 6 hours after drip is re-started.  06/11 0230 heparin level 0.38. Continue current regimen. Recheck heparin level and CBC with tomorrow AM labs.  Fulton Reek, PharmD, BCPS  05/06/18 3:23 AM

## 2018-05-06 NOTE — Care Management Important Message (Signed)
Copy of signed IM left with patient in room.  

## 2018-05-06 NOTE — H&P (View-Only) (Signed)
Progress Note  Patient Name: Donald Molina Date of Encounter: 05/06/2018  Primary Cardiologist: New to [facility] - will establish with Dr. Kirke Corin  Subjective   SOB much improved. Able to sleep fully supine overnight. No chest pain. Underwent R/LHC on 6/10 that showed normal coronary arteries with severely reduced LV systolic function. RHC showed severely elevated filling pressures, moderate pulmonary hypertension, and severely reduced cardiac output of 2.98 with a cardiac index of 1.47. Remains in Afib with ventricular rates well controlled. Remains volume up with a net - 2 L for the admission. No weight this morning. Renal function stable at 0.82.   Inpatient Medications    Scheduled Meds: . amiodarone  400 mg Oral BID  . folic acid  1 mg Oral Daily  . furosemide  40 mg Intravenous BID  . metoprolol tartrate  25 mg Oral TID  . multivitamin with minerals  1 tablet Oral Daily  . sodium chloride flush  3 mL Intravenous Q12H  . sodium chloride flush  3 mL Intravenous Q12H  . thiamine  100 mg Oral Daily   Or  . thiamine  100 mg Intravenous Daily   Continuous Infusions: . sodium chloride    . heparin 1,200 Units/hr (05/05/18 1827)   PRN Meds: sodium chloride, acetaminophen **OR** acetaminophen, LORazepam **OR** LORazepam, metoprolol tartrate, ondansetron **OR** ondansetron (ZOFRAN) IV, sodium chloride flush   Vital Signs    Vitals:   05/05/18 1316 05/05/18 1914 05/06/18 0410 05/06/18 0809  BP: 118/74 109/82 104/79 (!) 120/102  Pulse: 68 87 85 88  Resp: 16 18 17    Temp: 98.3 F (36.8 C) 98 F (36.7 C) 98 F (36.7 C) 98.2 F (36.8 C)  TempSrc:  Oral  Oral  SpO2: 98% 99% 98% 99%  Weight:      Height:        Intake/Output Summary (Last 24 hours) at 05/06/2018 0903 Last data filed at 05/06/2018 0300 Gross per 24 hour  Intake 0 ml  Output 550 ml  Net -550 ml   Filed Weights   05/03/18 1149 05/03/18 1409 05/05/18 0852  Weight: 179 lb (81.2 kg) 183 lb 9.6 oz (83.3 kg) 183  lb (83 kg)    Telemetry    Afib, 80s to 90s bpm, occasional PVCs - Personally Reviewed  ECG    n/a - Personally Reviewed  Physical Exam   GEN: No acute distress.   Neck: No JVD. Cardiac: Irregularly irregular, no murmurs, rubs, or gallops.  Respiratory: Clear to auscultation bilaterally.  GI: Soft, nontender, non-distended.   MS: No edema; No deformity. Neuro:  Alert and oriented x 3; Nonfocal.  Psych: Normal affect.  Labs    Chemistry Recent Labs  Lab 05/03/18 1153 05/04/18 0138 05/06/18 0221  NA 137 136 136  K 3.8 3.8 3.8  CL 108 106 105  CO2 21* 22 23  GLUCOSE 108* 109* 110*  BUN 15 16 21*  CREATININE 0.84 0.75 0.82  CALCIUM 8.5* 8.5* 8.8*  PROT 7.3  --   --   ALBUMIN 3.8  --   --   AST 30  --   --   ALT 20  --   --   ALKPHOS 51  --   --   BILITOT 0.9  --   --   GFRNONAA >60 >60 >60  GFRAA >60 >60 >60  ANIONGAP 8 8 8      Hematology Recent Labs  Lab 05/03/18 1153 05/04/18 0138  WBC 6.7 6.8  RBC 4.12*  4.14*  HGB 13.3 13.2  HCT 37.1* 37.3*  MCV 90.1 90.1  MCH 32.4 31.8  MCHC 36.0 35.3  RDW 12.4 12.5  PLT 216 202    Cardiac Enzymes Recent Labs  Lab 05/03/18 1153 05/03/18 1506 05/03/18 1851  TROPONINI <0.03 <0.03 <0.03   No results for input(s): TROPIPOC in the last 168 hours.   BNP Recent Labs  Lab 05/03/18 1153  BNP 1,561.0*     DDimer No results for input(s): DDIMER in the last 168 hours.   Radiology    No results found.  Cardiac Studies   TTE 05/03/2018: Study Conclusions  - Left ventricle: The cavity size was normal. Systolic function was   severely reduced. The estimated ejection fraction was in the   range of 25% to 30%. Diffuse hypokinesis. Regional wall motion   abnormalities cannot be excluded. The study is not technically   sufficient to allow evaluation of LV diastolic function. - Aortic root: The aortic root was mildly dilated - Mitral valve: There was moderate regurgitation. - Left atrium: The atrium was  moderately dilated. - Right ventricle: The cavity size was mildly to moderately   dilated. Wall thickness was normal. Systolic function was mildly   reduced. - Right atrium: The atrium was mildly dilated. - Tricuspid valve: There was moderate regurgitation. - Pulmonary arteries: Systolic pressure was moderately elevated PA   peak pressure: 52 mm Hg (S).  Impressions:  - Rhythm is atrial fibrillation.  R/LHC 05/05/2018: Conclusion   1.  Normal coronary arteries. 2.  Severely reduced LV systolic function by echo.  Left ventricular angiography was not performed. 3.  Right heart catheterization showed severely elevated filling pressures, moderate pulmonary hypertension and severely reduced cardiac output.  Cardiac output was 2.98 with a cardiac index of 1.47.  Recommendations: The patient has nonischemic cardiomyopathy likely tachycardia induced.  He continues to be significantly volume overloaded and I recommend continuing IV diuresis. Resume heparin drip 8 hours after sheath pull.  A DOAC can be started tomorrow.  I recommend TEE guided cardioversion before hospital discharge given degree of cardiomyopathy.  This can likely be done on Wednesday once volume overload improves.  I am going to start the patient on oral amiodarone to facilitate cardioversion.     Patient Profile     67 y.o. male admitted with acute systolic CHF and Afib with RVR.  Assessment & Plan    1. Acute systolic CHF/pulmonary hypertension: -LHC showed normal coronary arteries  -Likely tachy-mediated, will need repeat echo as an outpatient following conversion to sinus rhythm to assess for improved LVSF -SOB much improved -Continue IV Lasix 40 mg bid for today with plans to transition to PO on 6/12 -Change Lopressor to Toprol XL 75 mg daily given hsi cardiomyopathy -Add Entresto 24/26 mg bid -Consider adding spironolactone prior to discharge   2. New onset Afib: -Unknown chronicity  -Ventricular rates  well controlled -Plan for TEE/DCCV on 6/12 -Restart Eliquis 5 mg bid, stop heparin gtt -Consolidate Lopressor to 75 mg daily given his cardiomyopathy -Continue amiodarone loading at 400 mg bid x 1 week then 200 mg bid x 1 week then 200 mg daily thereafter   3. HTN: -Add Entresto as abvoe -Toprol XL as above  For questions or updates, please contact [facility] Please consult [email or web address] for contact info under Cardiology/STEMI.    Signed, Eula Listen, PA-C [facility] Pager: [phone] 05/06/2018, 9:03 AM

## 2018-05-06 NOTE — Progress Notes (Signed)
[facility] - [place] at [facility]   PATIENT NAME: Donald Molina    MR#:  [id number]  DATE OF BIRTH:  13-Feb-1951    CHIEF COMPLAINT:   Chief Complaint  Patient presents with  . Shortness of Breath  . Atrial Fibrillation   Shortness of breath and LE edema improving In Afib.  REVIEW OF SYSTEMS:   ROS CONSTITUTIONAL: No fever, fatigue or weakness.  EYES: No blurred or double vision.  EARS, NOSE, AND THROAT: No tinnitus or ear pain.  RESPIRATORY: No cough, shortness of breath, wheezing or hemoptysis.  CARDIOVASCULAR: No chest pain, orthopnea, edema.  GASTROINTESTINAL: No nausea, vomiting, diarrhea or abdominal pain.  GENITOURINARY: No dysuria, hematuria.  ENDOCRINE: No polyuria, nocturia,  HEMATOLOGY: No anemia, easy bruising or bleeding SKIN: No rash or lesion. MUSCULOSKELETAL: No joint pain or arthritis.   NEUROLOGIC: No tingling, numbness, weakness.  PSYCHIATRY: No anxiety or depression.   DRUG ALLERGIES:  No Known Allergies  VITALS:  Blood pressure (!) 120/102, pulse 88, temperature 98.2 F (36.8 C), temperature source Oral, resp. rate 17, height 5\' 11"  (1.803 m), weight 83 kg (183 lb), SpO2 99 %.  PHYSICAL EXAMINATION:  GENERAL:  67 y.o.-year-old patient lying in the bed with no acute distress.  EYES: Pupils equal, round, reactive to light and accommodation. No scleral icterus. Extraocular muscles intact.  HEENT: Head atraumatic, normocephalic. Oropharynx and nasopharynx clear.  NECK:  Supple, no jugular venous distention. No thyroid enlargement, no tenderness.  LUNGS: Normal breath sounds bilaterally, no wheezing, rales,rhonchi or crepitation. No use of accessory muscles of respiration.  CARDIOVASCULAR: S1, S2 irregular.  No murmurs, rubs, or gallops.  ABDOMEN: Soft, nontender, nondistended. Bowel sounds present. No organomegaly or mass.  EXTREMITIES: No cyanosis, or clubbing.  Bilateral lower [employer] edema NEUROLOGIC: Cranial nerves II  through XII are intact. Muscle strength 5/5 in all extremities. Sensation intact. Gait not checked.  PSYCHIATRIC: The patient is alert and oriented x 3.  SKIN: No obvious rash, lesion, or ulcer.    LABORATORY PANEL:   CBC Recent Labs  Lab 05/04/18 0138  WBC 6.8  HGB 13.2  HCT 37.3*  PLT 202   ------------------------------------------------------------------------------------------------------------------  Chemistries  Recent Labs  Lab 05/03/18 1153  05/06/18 0221  NA 137   < > 136  K 3.8   < > 3.8  CL 108   < > 105  CO2 21*   < > 23  GLUCOSE 108*   < > 110*  BUN 15   < > 21*  CREATININE 0.84   < > 0.82  CALCIUM 8.5*   < > 8.8*  AST 30  --   --   ALT 20  --   --   ALKPHOS 51  --   --   BILITOT 0.9  --   --    < > = values in this interval not displayed.   ------------------------------------------------------------------------------------------------------------------  Cardiac Enzymes Recent Labs  Lab 05/03/18 1851  TROPONINI <0.03   ------------------------------------------------------------------------------------------------------------------  RADIOLOGY:  No results found.  EKG:   Orders placed or performed during the hospital encounter of 05/03/18  . ED EKG  . ED EKG  . EKG 12-Lead  . EKG 12-Lead  . EKG  . EKG 12-Lead  . EKG 12-Lead  . EKG 12-Lead  . EKG 12-Lead    ASSESSMENT AND PLAN:   *Acute on chronic systolic congestive heart failure. Ejection fraction 25 to 30%. IV Lasix 40 BID. Moderate input and output. Repeat BMP in  the morning. Cardiac catheterization with normal coronaries. Add Entresto  *New onset atrial fibrillation on metoprolol. Amiodarone . Continue heparin drip. Noac to be started by cardiology Cardioversion in AM if still in Afib  *Hypertension. Continue metoprolol, entresto  All the records are reviewed and case discussed with Care Management/Social Workerr. Management plans discussed with the patient, family and  they are in agreement.  CODE STATUS: Full code  TOTAL TIME TAKING CARE OF THIS PATIENT: 35 minutes.   POSSIBLE D/C IN 1-2 DAYS, DEPENDING ON CLINICAL CONDITION.  Orie Fisherman M.D on 05/06/2018 at 9:41 AM  Between 7am to 6pm - Pager - [phone]  After 6pm go to [email or web address] - password [email or web address]  [facility]  Office  [phone]  CC: Primary care physician; Patient, No Pcp Per   Note: This dictation was prepared with Dragon dictation along with smaller phrase technology. Any transcriptional errors that result from this process are unintentional.

## 2018-05-06 NOTE — Plan of Care (Signed)
  Problem: Clinical Measurements: Goal: Cardiovascular complication will be avoided Outcome: Progressing   

## 2018-05-06 NOTE — Progress Notes (Signed)
Progress Note  Patient Name: Donald Molina Date of Encounter: 05/06/2018  Primary Cardiologist: New to [facility] - will establish with Dr. Kirke Corin  Subjective   SOB much improved. Able to sleep fully supine overnight. No chest pain. Underwent R/LHC on 6/10 that showed normal coronary arteries with severely reduced LV systolic function. RHC showed severely elevated filling pressures, moderate pulmonary hypertension, and severely reduced cardiac output of 2.98 with a cardiac index of 1.47. Remains in Afib with ventricular rates well controlled. Remains volume up with a net - 2 L for the admission. No weight this morning. Renal function stable at 0.82.   Inpatient Medications    Scheduled Meds: . amiodarone  400 mg Oral BID  . folic acid  1 mg Oral Daily  . furosemide  40 mg Intravenous BID  . metoprolol tartrate  25 mg Oral TID  . multivitamin with minerals  1 tablet Oral Daily  . sodium chloride flush  3 mL Intravenous Q12H  . sodium chloride flush  3 mL Intravenous Q12H  . thiamine  100 mg Oral Daily   Or  . thiamine  100 mg Intravenous Daily   Continuous Infusions: . sodium chloride    . heparin 1,200 Units/hr (05/05/18 1827)   PRN Meds: sodium chloride, acetaminophen **OR** acetaminophen, LORazepam **OR** LORazepam, metoprolol tartrate, ondansetron **OR** ondansetron (ZOFRAN) IV, sodium chloride flush   Vital Signs    Vitals:   05/05/18 1316 05/05/18 1914 05/06/18 0410 05/06/18 0809  BP: 118/74 109/82 104/79 (!) 120/102  Pulse: 68 87 85 88  Resp: 16 18 17    Temp: 98.3 F (36.8 C) 98 F (36.7 C) 98 F (36.7 C) 98.2 F (36.8 C)  TempSrc:  Oral  Oral  SpO2: 98% 99% 98% 99%  Weight:      Height:        Intake/Output Summary (Last 24 hours) at 05/06/2018 0903 Last data filed at 05/06/2018 0300 Gross per 24 hour  Intake 0 ml  Output 550 ml  Net -550 ml   Filed Weights   05/03/18 1149 05/03/18 1409 05/05/18 0852  Weight: 179 lb (81.2 kg) 183 lb 9.6 oz (83.3 kg) 183  lb (83 kg)    Telemetry    Afib, 80s to 90s bpm, occasional PVCs - Personally Reviewed  ECG    n/a - Personally Reviewed  Physical Exam   GEN: No acute distress.   Neck: No JVD. Cardiac: Irregularly irregular, no murmurs, rubs, or gallops.  Respiratory: Clear to auscultation bilaterally.  GI: Soft, nontender, non-distended.   MS: No edema; No deformity. Neuro:  Alert and oriented x 3; Nonfocal.  Psych: Normal affect.  Labs    Chemistry Recent Labs  Lab 05/03/18 1153 05/04/18 0138 05/06/18 0221  NA 137 136 136  K 3.8 3.8 3.8  CL 108 106 105  CO2 21* 22 23  GLUCOSE 108* 109* 110*  BUN 15 16 21*  CREATININE 0.84 0.75 0.82  CALCIUM 8.5* 8.5* 8.8*  PROT 7.3  --   --   ALBUMIN 3.8  --   --   AST 30  --   --   ALT 20  --   --   ALKPHOS 51  --   --   BILITOT 0.9  --   --   GFRNONAA >60 >60 >60  GFRAA >60 >60 >60  ANIONGAP 8 8 8      Hematology Recent Labs  Lab 05/03/18 1153 05/04/18 0138  WBC 6.7 6.8  RBC 4.12*  4.14*  HGB 13.3 13.2  HCT 37.1* 37.3*  MCV 90.1 90.1  MCH 32.4 31.8  MCHC 36.0 35.3  RDW 12.4 12.5  PLT 216 202    Cardiac Enzymes Recent Labs  Lab 05/03/18 1153 05/03/18 1506 05/03/18 1851  TROPONINI <0.03 <0.03 <0.03   No results for input(s): TROPIPOC in the last 168 hours.   BNP Recent Labs  Lab 05/03/18 1153  BNP 1,561.0*     DDimer No results for input(s): DDIMER in the last 168 hours.   Radiology    No results found.  Cardiac Studies   TTE 05/03/2018: Study Conclusions  - Left ventricle: The cavity size was normal. Systolic function was   severely reduced. The estimated ejection fraction was in the   range of 25% to 30%. Diffuse hypokinesis. Regional wall motion   abnormalities cannot be excluded. The study is not technically   sufficient to allow evaluation of LV diastolic function. - Aortic root: The aortic root was mildly dilated - Mitral valve: There was moderate regurgitation. - Left atrium: The atrium was  moderately dilated. - Right ventricle: The cavity size was mildly to moderately   dilated. Wall thickness was normal. Systolic function was mildly   reduced. - Right atrium: The atrium was mildly dilated. - Tricuspid valve: There was moderate regurgitation. - Pulmonary arteries: Systolic pressure was moderately elevated PA   peak pressure: 52 mm Hg (S).  Impressions:  - Rhythm is atrial fibrillation.  R/LHC 05/05/2018: Conclusion   1.  Normal coronary arteries. 2.  Severely reduced LV systolic function by echo.  Left ventricular angiography was not performed. 3.  Right heart catheterization showed severely elevated filling pressures, moderate pulmonary hypertension and severely reduced cardiac output.  Cardiac output was 2.98 with a cardiac index of 1.47.  Recommendations: The patient has nonischemic cardiomyopathy likely tachycardia induced.  He continues to be significantly volume overloaded and I recommend continuing IV diuresis. Resume heparin drip 8 hours after sheath pull.  A DOAC can be started tomorrow.  I recommend TEE guided cardioversion before hospital discharge given degree of cardiomyopathy.  This can likely be done on Wednesday once volume overload improves.  I am going to start the patient on oral amiodarone to facilitate cardioversion.     Patient Profile     67 y.o. male admitted with acute systolic CHF and Afib with RVR.  Assessment & Plan    1. Acute systolic CHF/pulmonary hypertension: -LHC showed normal coronary arteries  -Likely tachy-mediated, will need repeat echo as an outpatient following conversion to sinus rhythm to assess for improved LVSF -SOB much improved -Continue IV Lasix 40 mg bid for today with plans to transition to PO on 6/12 -Change Lopressor to Toprol XL 75 mg daily given hsi cardiomyopathy -Add Entresto 24/26 mg bid -Consider adding spironolactone prior to discharge   2. New onset Afib: -Unknown chronicity  -Ventricular rates  well controlled -Plan for TEE/DCCV on 6/12 -Restart Eliquis 5 mg bid, stop heparin gtt -Consolidate Lopressor to 75 mg daily given his cardiomyopathy -Continue amiodarone loading at 400 mg bid x 1 week then 200 mg bid x 1 week then 200 mg daily thereafter   3. HTN: -Add Entresto as abvoe -Toprol XL as above  For questions or updates, please contact [facility] Please consult [email or web address] for contact info under Cardiology/STEMI.    Signed, Eula Listen, PA-C [facility] Pager: [phone] 05/06/2018, 9:03 AM

## 2018-05-06 NOTE — Care Management (Signed)
Patient independent from home alone. He depends on sister Arnoldo Hooker for transportation. He has not been on home medications or been to the doctor.  He does not have a PCP. Donald Molina goes to [facility] clinic and hopes that he could too since she drives him.  Follow up appointment established with Dr. Vinson Moselle with [facility] [phone] for July 1 at 0800AM.

## 2018-05-07 ENCOUNTER — Inpatient Hospital Stay: Payer: Medicare Other | Admitting: Anesthesiology

## 2018-05-07 ENCOUNTER — Inpatient Hospital Stay (HOSPITAL_COMMUNITY)
Admit: 2018-05-07 | Discharge: 2018-05-07 | Disposition: A | Discharge status: Home / Self Care | Payer: Medicare Other | Attending: Physician Assistant | Admitting: Physician Assistant

## 2018-05-07 ENCOUNTER — Encounter
Admission: EM | Disposition: A | Discharge status: Home Health | Payer: Self-pay | Source: Home / Self Care | Attending: Internal Medicine

## 2018-05-07 ENCOUNTER — Encounter: Payer: Self-pay | Admitting: Internal Medicine

## 2018-05-07 DIAGNOSIS — I340 Nonrheumatic mitral (valve) insufficiency: Secondary | ICD-10-CM

## 2018-05-07 HISTORY — PX: TEE WITHOUT CARDIOVERSION: SHX5443

## 2018-05-07 LAB — CBC
HCT: 38.1 % — ABNORMAL LOW (ref 40.0–52.0)
HEMOGLOBIN: 13.4 g/dL (ref 13.0–18.0)
MCH: 32 pg (ref 26.0–34.0)
MCHC: 35.2 g/dL (ref 32.0–36.0)
MCV: 90.9 fL (ref 80.0–100.0)
PLATELETS: 186 10*3/uL (ref 150–440)
RBC: 4.2 MIL/uL — AB (ref 4.40–5.90)
RDW: 12.9 % (ref 11.5–14.5)
WBC: 6.8 10*3/uL (ref 3.8–10.6)

## 2018-05-07 SURGERY — ECHOCARDIOGRAM, TRANSESOPHAGEAL
Anesthesia: General

## 2018-05-07 SURGERY — CARDIOVERSION (CATH LAB)
Anesthesia: General

## 2018-05-07 MED ORDER — BUTAMBEN-TETRACAINE-BENZOCAINE 2-2-14 % EX AERO
INHALATION_SPRAY | CUTANEOUS | Status: AC
  Filled 2018-05-07: qty 5

## 2018-05-07 MED ORDER — SODIUM CHLORIDE 0.9% FLUSH
3.0000 mL | Freq: Two times a day (BID) | INTRAVENOUS | Status: DC
  Administered 2018-05-07 – 2018-05-08 (×3): 3 mL via INTRAVENOUS

## 2018-05-07 MED ORDER — SODIUM CHLORIDE 0.9% FLUSH: 3.0000 mL | INTRAVENOUS | Status: DC | PRN

## 2018-05-07 MED ORDER — SACUBITRIL-VALSARTAN 24-26 MG PO TABS
1.0000 | ORAL_TABLET | Freq: Two times a day (BID) | ORAL | Status: DC
  Administered 2018-05-08: 1 via ORAL
  Filled 2018-05-07: qty 1

## 2018-05-07 MED ORDER — PHENYLEPHRINE HCL 10 MG/ML IJ SOLN
INTRAMUSCULAR | Status: DC | PRN
  Administered 2018-05-07 (×3): 100 ug via INTRAVENOUS
  Administered 2018-05-07: 200 ug via INTRAVENOUS
  Administered 2018-05-07: 100 ug via INTRAVENOUS

## 2018-05-07 MED ORDER — PROPOFOL 10 MG/ML IV BOLUS
INTRAVENOUS | Status: DC | PRN
  Administered 2018-05-07 (×3): 20 mg via INTRAVENOUS

## 2018-05-07 MED ORDER — SODIUM CHLORIDE FLUSH 0.9 % IV SOLN
INTRAVENOUS | Status: AC
  Administered 2018-05-07: 3 mL via INTRAVENOUS
  Filled 2018-05-07: qty 10

## 2018-05-07 MED ORDER — MIDAZOLAM HCL 2 MG/2ML IJ SOLN
INTRAMUSCULAR | Status: DC | PRN
  Administered 2018-05-07: 2 mg via INTRAVENOUS

## 2018-05-07 MED ORDER — HYDROCORTISONE 1 % EX CREA
1.0000 "application " | TOPICAL_CREAM | Freq: Three times a day (TID) | CUTANEOUS | Status: DC | PRN
  Filled 2018-05-07: qty 28

## 2018-05-07 MED ORDER — SODIUM CHLORIDE 0.9 % IV SOLN: 250.0000 mL | INTRAVENOUS | Status: DC

## 2018-05-07 MED ORDER — POTASSIUM CHLORIDE CRYS ER 20 MEQ PO TBCR
40.0000 meq | EXTENDED_RELEASE_TABLET | Freq: Once | ORAL | Status: AC
  Administered 2018-05-07: 40 meq via ORAL

## 2018-05-07 MED ORDER — LIDOCAINE VISCOUS HCL 2 % MT SOLN
OROMUCOSAL | Status: AC
  Administered 2018-05-07: 09:00:00
  Filled 2018-05-07: qty 15

## 2018-05-07 MED ORDER — FENTANYL CITRATE (PF) 100 MCG/2ML IJ SOLN
INTRAMUSCULAR | Status: AC
  Filled 2018-05-07: qty 2

## 2018-05-07 MED ORDER — PROPOFOL 500 MG/50ML IV EMUL
INTRAVENOUS | Status: AC
  Filled 2018-05-07: qty 50

## 2018-05-07 MED ORDER — MIDAZOLAM HCL 2 MG/2ML IJ SOLN
INTRAMUSCULAR | Status: AC
  Filled 2018-05-07: qty 2

## 2018-05-07 MED ORDER — METOPROLOL SUCCINATE ER 25 MG PO TB24
25.0000 mg | ORAL_TABLET | Freq: Every day | ORAL | Status: DC
  Administered 2018-05-08: 25 mg via ORAL
  Filled 2018-05-07: qty 1

## 2018-05-07 NOTE — Care Management (Addendum)
patient has access to scales for daily weights.  Denies that there are any transportation issues that would prevent him from keeping appointments.  Verbally confirms he has part d coverage with his medicare and that he would have any issues paying for his medications.  Provided patient with coupon for 30 day trial of Eliquis.  uses [facility].  Heart failure is a new diagnosis for patient and is feeling "a bit overwhelmed" with all education. Is not requiring supplemental 02.

## 2018-05-07 NOTE — Interval H&P Note (Signed)
History and Physical Interval Note:  05/07/2018 7:42 AM  Donald Molina  has presented today for surgery, with the diagnosis of atrial fibrillation/acute systolic congestive heart failure  The various methods of treatment have been discussed with the patient and family. After consideration of risks, benefits and other options for treatment, the patient has consented to  Procedure(s): TRANSESOPHAGEAL ECHOCARDIOGRAM (TEE) (N/A) as a surgical intervention .  The patient's history has been reviewed, patient examined, no change in status, stable for surgery.  I have reviewed the patient's chart and labs.  Questions were answered to the patient's satisfaction.     Evangaline Jou

## 2018-05-07 NOTE — Progress Notes (Signed)
*  PRELIMINARY RESULTS* Echocardiogram Echocardiogram Transesophageal has been performed.  Donald Molina Donald Molina 05/07/2018, 8:28 AM

## 2018-05-07 NOTE — Transfer of Care (Signed)
Immediate Anesthesia Transfer of Care Note  Patient: Donald Molina  Procedure(s) Performed: TRANSESOPHAGEAL ECHOCARDIOGRAM (TEE) (N/A ) CARDIOVERSION (N/A )  Patient Location: Short Stay  Anesthesia Type:General  Level of Consciousness: sedated  Airway & Oxygen Therapy: Patient connected to nasal cannula oxygen  Post-op Assessment: Post -op Vital signs reviewed and stable  Post vital signs: stable  Last Vitals:  Vitals Value Taken Time  BP 97/68 05/07/2018  8:00 AM  Temp    Pulse 50 05/07/2018  8:00 AM  Resp 16 05/07/2018  8:00 AM  SpO2      Last Pain:  Vitals:   05/07/18 0716  TempSrc: Oral  PainSc: 0-No pain         Complications: No apparent anesthesia complications

## 2018-05-07 NOTE — CV Procedure (Signed)
    Transesophageal Echocardiogram Note  Mikaele Steffek [id number] 1951-08-25  Procedure: Transesophageal Echocardiogram Indications: Atrial fibrillation  Procedure Details Consent: Obtained Time Out: Verified patient identification, verified procedure, site/side was marked, verified correct patient position, special equipment/implants available, Radiology Safety Procedures followed,  medications/allergies/relevent history reviewed, required imaging and test results available.  Performed  Medications:  During this procedure the patient is administered propofol by anesthesia to achieve and maintain moderate conscious sedation.  Left Ventrical:  Severely reduced LVEF with global hypokinesis  Mitral Valve: Mild MR.  Aortic Valve: Trileaflet with trivial AI.  Tricuspid Valve: Mild to moderate TR.  Pulmonic Valve: Trivial PR.  Left Atrium/ Left atrial appendage: Spontaneous echocontrast without thrombus.  Atrial septum: Intact by Duplex and color Doppler.  Aorta: Normal  Misc: Small left pleural effusion  Complications: No apparent complications Patient did tolerate procedure well.  Yvonne Kendall, MD 05/07/2018, 8:03 AM

## 2018-05-07 NOTE — Progress Notes (Signed)
Pt back to floor. Report given from [facility]. Gag reflex was checked and not present. Pt educated on being NPO until gag reflex is present.

## 2018-05-07 NOTE — Plan of Care (Signed)
  Problem: Clinical Measurements: Goal: Diagnostic test results will improve Outcome: Progressing   Problem: Cardiac: Goal: Ability to achieve and maintain adequate cardiopulmonary perfusion will improve Outcome: Progressing   Problem: Cardiac: Goal: Ability to achieve and maintain adequate cardiopulmonary perfusion will improve Outcome: Progressing

## 2018-05-07 NOTE — Progress Notes (Signed)
Gag reflex was checked and still absent.

## 2018-05-07 NOTE — CV Procedure (Signed)
    Cardioversion Note  Donald Molina [id number] 13-Oct-1951  Procedure: DC Cardioversion Indications: Atrial fibrillation  Procedure Details Consent: Obtained Time Out: Verified patient identification, verified procedure, site/side was marked, verified correct patient position, special equipment/implants available, Radiology Safety Procedures followed,  medications/allergies/relevent history reviewed, required imaging and test results available.  Performed  The patient has been on adequate anticoagulation.  TEE immediately before cardioversion showed no thrombus.  The patient received IV propofol for sedation by anesthesia.  Synchronous cardioversion was performed at 120 joules.  The cardioversion was successful with restoration of sinus bradycardia.  Complications: No apparent complications Patient did tolerate procedure well.  Yvonne Kendall., MD 05/07/2018, 8:05 AM

## 2018-05-07 NOTE — Progress Notes (Signed)
Progress Note  Patient Name: Donald Molina Date of Encounter: 05/07/2018  Primary Cardiologist:  Lorine Bears, MD  Subjective   Cough post-cardioversion.  Otherwise, [name] feels well without chest pain, shortness of breath, palpitations, or edema.  Inpatient Medications    Scheduled Meds: . amiodarone  400 mg Oral BID  . apixaban  5 mg Oral BID  . folic acid  1 mg Oral Daily  . furosemide  40 mg Intravenous BID  . lidocaine      . metoprolol succinate  75 mg Oral Daily  . multivitamin with minerals  1 tablet Oral Daily  . sacubitril-valsartan  1 tablet Oral BID  . sodium chloride flush  3 mL Intravenous Q12H  . sodium chloride flush  3 mL Intravenous Q12H  . sodium chloride flush      . thiamine  100 mg Oral Daily   Or  . thiamine  100 mg Intravenous Daily   Continuous Infusions: . sodium chloride    . sodium chloride 20 mL/hr at 05/07/18 0722   PRN Meds: sodium chloride, acetaminophen **OR** acetaminophen, LORazepam **OR** LORazepam, metoprolol tartrate, ondansetron **OR** ondansetron (ZOFRAN) IV, sodium chloride flush   Vital Signs    Vitals:   05/07/18 0805 05/07/18 0806 05/07/18 0818 05/07/18 0830  BP: (!) 80/67  (!) 84/61 95/80  Pulse: (!) 54 (!) 53 (!) 52 (!) 54  Resp: (!) 38 (!) 38 (!) 24 19  Temp:      TempSrc:      SpO2: 97% 99% 98% 98%  Weight:      Height:        Intake/Output Summary (Last 24 hours) at 05/07/2018 0836 Last data filed at 05/07/2018 6861 Gross per 24 hour  Intake 120 ml  Output 1100 ml  Net -980 ml   Filed Weights   05/03/18 1149 05/03/18 1409 05/05/18 0852  Weight: 179 lb (81.2 kg) 183 lb 9.6 oz (83.3 kg) 183 lb (83 kg)    Telemetry    Atrial fibrillation - Personally Reviewed  ECG    Pre-cardioversion - atrial fibrillation, LAFB, and anterolateral TWI - Personally Reviewed Post-cardioversion - sinus bradycardia with PAC's, LAFB, and anterolateral TWI - Personally Reviewed  Physical Exam   GEN: No acute  distress.   Neck: No JVD Cardiac: Distant heart sounds.  RRR without murmurs. Respiratory: Mildly diminished throughout without wheezes or crackles. GI: Soft, nontender, non-distended  MS: No edema; No deformity. Neuro:  Nonfocal  Psych: Normal affect   Labs    Chemistry Recent Labs  Lab 05/03/18 1153 05/04/18 0138 05/06/18 0221 05/06/18 1010  NA 137 136 136 136  K 3.8 3.8 3.8 3.4*  CL 108 106 105 102  CO2 21* 22 23 23   GLUCOSE 108* 109* 110* 134*  BUN 15 16 21* 20  CREATININE 0.84 0.75 0.82 0.86  CALCIUM 8.5* 8.5* 8.8* 9.1  PROT 7.3  --   --   --   ALBUMIN 3.8  --   --   --   AST 30  --   --   --   ALT 20  --   --   --   ALKPHOS 51  --   --   --   BILITOT 0.9  --   --   --   GFRNONAA >60 >60 >60 >60  GFRAA >60 >60 >60 >60  ANIONGAP 8 8 8 11      Hematology Recent Labs  Lab 05/03/18 1153 05/04/18 0138 05/07/18 6837  WBC 6.7 6.8 6.8  RBC 4.12* 4.14* 4.20*  HGB 13.3 13.2 13.4  HCT 37.1* 37.3* 38.1*  MCV 90.1 90.1 90.9  MCH 32.4 31.8 32.0  MCHC 36.0 35.3 35.2  RDW 12.4 12.5 12.9  PLT 216 202 186    Cardiac Enzymes Recent Labs  Lab 05/03/18 1153 05/03/18 1506 05/03/18 1851  TROPONINI <0.03 <0.03 <0.03   No results for input(s): TROPIPOC in the last 168 hours.   BNP Recent Labs  Lab 05/03/18 1153  BNP 1,561.0*     DDimer No results for input(s): DDIMER in the last 168 hours.   Radiology    No results found.  Cardiac Studies   TTE (05/03/18): - Left ventricle: The cavity size was normal. Systolic function was   severely reduced. The estimated ejection fraction was in the   range of 25% to 30%. Diffuse hypokinesis. Regional wall motion   abnormalities cannot be excluded. The study is not technically   sufficient to allow evaluation of LV diastolic function. - Aortic root: The aortic root was mildly dilated - Mitral valve: There was moderate regurgitation. - Left atrium: The atrium was moderately dilated. - Right ventricle: The cavity size  was mildly to moderately   dilated. Wall thickness was normal. Systolic function was mildly   reduced. - Right atrium: The atrium was mildly dilated. - Tricuspid valve: There was moderate regurgitation. - Pulmonary arteries: Systolic pressure was moderately elevated PA   peak pressure: 52 mm Hg (S).  LHC/RHC (05/05/18): 1.  Normal coronary arteries. 2.  Severely reduced LV systolic function by echo.  Left ventricular angiography was not performed. 3.  Right heart catheterization showed severely elevated filling pressures, moderate pulmonary hypertension and severely reduced cardiac output.  Cardiac output was 2.98 with a cardiac index of 1.47.  TEE (05/07/18; prelim): Severely reduced left and right heart contraction.  No LAA/LA thrombus.  Patient Profile     67 y.o. male man with acute systolic heart failure due to non-ischemic cardiomyopathy (likley tachy-mediated) and atrial fibrillation with rapid ventricular response.  Assessment & Plan    Acute systolic heart failure Patient is symptomatically much better.  I suspect he remains volume overloaded, given severely elevated filling pressures at the time of catheterization 2 days ago.  Continue IV diuresis with furosemide 40 mg IV BID.  Given bradycardia post-cardioversion and severely reduced cardiac output, I will hold metoprolol today and anticipate restarting metoprolol succinate 12.5 mg daily tomorrow.  Continue Entresto, as blood pressure allows.  Consider adding spironolactone before discharge, as well as digoxin for enhanced contractility, as heart rate allows.  Atrial fibrillation with rapid ventricular resonse Patient underwent successful TEE-guided cardioversion this morning with restoration of sinus bradycardia.  Continue indefinite anticoagulation with apixaban 5 mg BID.  Complete amiodarone load (10 grams) and then transition to amiodarone 200 mg daily.  Hold metoprolol succinate; anticipate restarting 12.5 mg  daily tomorrow, as heart rate allows.  Hypertension Soft blood pressure following cardioversion.  Hold metoprolol and Entresto this morning.  Restart as BP allows.  For questions or updates, please contact [facility] Please consult [email or web address] for contact info under [facility].     Signed, Yvonne Kendall, MD  05/07/2018, 8:36 AM

## 2018-05-07 NOTE — Anesthesia Post-op Follow-up Note (Signed)
Anesthesia QCDR form completed.        

## 2018-05-07 NOTE — Progress Notes (Signed)
Provided patient with "Living Better with Heart Failure" packet. Briefly reviewed definition of heart failure and signs and symptoms of an exacerbation. Reviewed importance of and reason behind checking weight daily in the AM, after using the bathroom, but before getting dressed. Discussed when to call the Dr= weight gain of >2lb overnight of 5lb in a week,  Discussed yellow zone= call MD: weight gain of >2lb overnight of 5lb in a week, increased swelling, increased SOB when lying down, chest discomfort, dizziness, increased fatigue Red Zone= call 911: struggle to breath, fainting or near fainting, significant chest pain Reviewed low sodium diet <2g/day-provided handout of recommended and not recommended foods  Fluid restriction <2L/day Reviewed how to read nutrition label Reviewed medication changes: Talked specifically about furosemide, metoprolol succinate, and Entresto with possibility of adding spironolactone and digoxin per cardiology note.  Explained briefly why pt is on the medications (either make you feel better, live longer or keep you out of the hospital) and discussed monitoring and side effects  Discussed tobacco cessation: n/a - pt does not smoke Discussed exercise: [place]

## 2018-05-07 NOTE — Progress Notes (Signed)
Gag reflex Is present

## 2018-05-07 NOTE — Progress Notes (Signed)
[facility] - [place] [facility]   PATIENT NAME: Donald Molina    MR#:  [id number]  DATE OF BIRTH:  1951-10-09    CHIEF COMPLAINT:   Chief Complaint  Patient presents with  . Shortness of Breath  . Atrial Fibrillation   Shortness of breath and LE edema much improved. On IV lasix  TEE cardioversion earlier today and now in NSR  REVIEW OF SYSTEMS:   ROS CONSTITUTIONAL: No fever, fatigue or weakness.  EYES: No blurred or double vision.  EARS, NOSE, AND THROAT: No tinnitus or ear pain.  RESPIRATORY: No cough, shortness of breath, wheezing or hemoptysis.  CARDIOVASCULAR: No chest pain, orthopnea, edema.  GASTROINTESTINAL: No nausea, vomiting, diarrhea or abdominal pain.  GENITOURINARY: No dysuria, hematuria.  ENDOCRINE: No polyuria, nocturia,  HEMATOLOGY: No anemia, easy bruising or bleeding SKIN: No rash or lesion. MUSCULOSKELETAL: No joint pain or arthritis.   NEUROLOGIC: No tingling, numbness, weakness.  PSYCHIATRY: No anxiety or depression.   DRUG ALLERGIES:  No Known Allergies  VITALS:  Blood pressure 98/80, pulse 74, temperature (!) 97.5 F (36.4 C), temperature source Oral, resp. rate (!) 24, height 5\' 11"  (1.803 m), weight 83 kg (183 lb), SpO2 100 %.  PHYSICAL EXAMINATION:  GENERAL:  67 y.o.-year-old patient lying in the bed with no acute distress.  EYES: Pupils equal, round, reactive to light and accommodation. No scleral icterus. Extraocular muscles intact.  HEENT: Head atraumatic, normocephalic. Oropharynx and nasopharynx clear.  NECK:  Supple, no jugular venous distention. No thyroid enlargement, no tenderness.  LUNGS: Normal breath sounds bilaterally, no wheezing, rales,rhonchi or crepitation. No use of accessory muscles of respiration.  CARDIOVASCULAR: S1, S2 irregular.  No murmurs, rubs, or gallops.  ABDOMEN: Soft, nontender, nondistended. Bowel sounds present. No organomegaly or mass.  EXTREMITIES: No cyanosis, or clubbing.   Bilateral lower [employer] edema NEUROLOGIC: Cranial nerves II through XII are intact. Muscle strength 5/5 in all extremities. Sensation intact. Gait not checked.  PSYCHIATRIC: The patient is alert and oriented x 3.  SKIN: No obvious rash, lesion, or ulcer.    LABORATORY PANEL:   CBC Recent Labs  Lab 05/07/18 0511  WBC 6.8  HGB 13.4  HCT 38.1*  PLT 186   ------------------------------------------------------------------------------------------------------------------  Chemistries  Recent Labs  Lab 05/03/18 1153  05/06/18 1010  NA 137   < > 136  K 3.8   < > 3.4*  CL 108   < > 102  CO2 21*   < > 23  GLUCOSE 108*   < > 134*  BUN 15   < > 20  CREATININE 0.84   < > 0.86  CALCIUM 8.5*   < > 9.1  AST 30  --   --   ALT 20  --   --   ALKPHOS 51  --   --   BILITOT 0.9  --   --    < > = values in this interval not displayed.   ------------------------------------------------------------------------------------------------------------------  Cardiac Enzymes Recent Labs  Lab 05/03/18 1851  TROPONINI <0.03   ------------------------------------------------------------------------------------------------------------------  RADIOLOGY:  No results found.  EKG:   Orders placed or performed during the hospital encounter of 05/03/18  . ED EKG  . ED EKG  . EKG 12-Lead  . EKG 12-Lead  . EKG  . EKG 12-Lead  . EKG 12-Lead  . EKG 12-Lead  . EKG 12-Lead  . EKG 12-Lead  . EKG 12-Lead  . EKG 12-Lead  . EKG 12-Lead  . EKG  12-Lead  . EKG 12-Lead  . EKG 12-Lead  . EKG 12-Lead    ASSESSMENT AND PLAN:   *Acute on chronic systolic congestive heart failure. Ejection fraction 25 to 30%.  IV Lasix 40 BID. Moderate input and output.  Repeat BMP in the morning. Cardiac catheterization with normal coronaries. Metoprolol and entresto  *New onset atrial fibrillation s/p cardioversion and now in NSR on metoprolol. Amiodarone . Eliquis.  *Hypertension. Continue metoprolol,  entresto. Hold todays dose due to hypotension  D/C home in AM  All the records are reviewed and case discussed with Care Management/Social Workerr. Management plans discussed with the patient, family and they are in agreement.  CODE STATUS: Full code  TOTAL TIME TAKING CARE OF THIS PATIENT: 35 minutes.   POSSIBLE D/C IN 1-2 DAYS, DEPENDING ON CLINICAL CONDITION.  Orie Fisherman M.D on 05/07/2018 at 11:56 AM  Between 7am to 6pm - Pager - [phone]  After 6pm go to [email or web address] - password [email or web address]  [facility]  Office  [phone]  CC: Primary care physician; Patient, No Pcp Per   Note: This dictation was prepared with Dragon dictation along with smaller phrase technology. Any transcriptional errors that result from this process are unintentional.

## 2018-05-07 NOTE — Anesthesia Preprocedure Evaluation (Addendum)
Anesthesia Evaluation  Patient identified by MRN, date of birth, ID band Patient awake    Reviewed: Allergy & Precautions, H&P , NPO status , reviewed documented beta blocker date and time   Airway Mallampati: II  TM Distance: >3 FB Neck ROM: full    Dental  (+) Poor Dentition, Missing, Edentulous Upper, Loose R lower loose:   Pulmonary    Pulmonary exam normal        Cardiovascular hypertension, +CHF  Normal cardiovascular exam+ dysrhythmias Atrial Fibrillation   ECHO Study Conclusions  - Left ventricle: The cavity size was normal. Systolic function was   severely reduced. The estimated ejection fraction was in the   range of 25% to 30%. Diffuse hypokinesis. Regional wall motion   abnormalities cannot be excluded. The study is not technically   sufficient to allow evaluation of LV diastolic function. - Aortic root: The aortic root was mildly dilated - Mitral valve: There was moderate regurgitation. - Left atrium: The atrium was moderately dilated. - Right ventricle: The cavity size was mildly to moderately   dilated. Wall thickness was normal. Systolic function was mildly   reduced. - Right atrium: The atrium was mildly dilated. - Tricuspid valve: There was moderate regurgitation. - Pulmonary arteries: Systolic pressure was moderately elevated PA   peak pressure: 52 mm Hg (S).  Impressions:  - Rhythm is atrial fibrillation. Study date: 05/03/2018   Neuro/Psych    GI/Hepatic neg GERD  ,  Endo/Other    Renal/GU      Musculoskeletal   Abdominal   Peds  Hematology   Anesthesia Other Findings Past Medical History: No date: Hypertension  Past Surgical History: No date: NO PAST SURGERIES 05/05/2018: RIGHT/LEFT HEART CATH AND CORONARY ANGIOGRAPHY; N/A     Comment:  Procedure: RIGHT/LEFT HEART CATH AND CORONARY               ANGIOGRAPHY;  Surgeon: Iran Ouch, MD;  Location:               [facility]  INVASIVE CV LAB;  Service: Cardiovascular;                Laterality: N/A;  BMI    Body Mass Index:  25.52 kg/m      Reproductive/Obstetrics                             Anesthesia Physical Anesthesia Plan  ASA: IV  Anesthesia Plan: General   Post-op Pain Management:    Induction:   PONV Risk Score and Plan: 2 and Treatment may vary due to age or medical condition and TIVA  Airway Management Planned:   Additional Equipment:   Intra-op Plan:   Post-operative Plan:   Informed Consent: I have reviewed the patients History and Physical, chart, labs and discussed the procedure including the risks, benefits and alternatives for the proposed anesthesia with the patient or authorized representative who has indicated his/her understanding and acceptance.   Dental Advisory Given  Plan Discussed with: CRNA  Anesthesia Plan Comments:         Anesthesia Quick Evaluation

## 2018-05-07 NOTE — Progress Notes (Signed)
Called Dr. Cherlynn Kaiser about patient's BP of 92/63, has scheduled entresto, order to hold. RN will continue to monitor.

## 2018-05-08 LAB — BASIC METABOLIC PANEL
Anion gap: 7 (ref 5–15)
BUN: 20 mg/dL (ref 6–20)
CO2: 24 mmol/L (ref 22–32)
CREATININE: 1.05 mg/dL (ref 0.61–1.24)
Calcium: 8.8 mg/dL — ABNORMAL LOW (ref 8.9–10.3)
Chloride: 105 mmol/L (ref 101–111)
GFR calc Af Amer: >60 mL/min (ref >60)
GLUCOSE: 98 mg/dL (ref 65–99)
POTASSIUM: 4.3 mmol/L (ref 3.5–5.1)
Sodium: 136 mmol/L (ref 135–145)

## 2018-05-08 LAB — MAGNESIUM: Magnesium: 1.9 mg/dL (ref 1.7–2.4)

## 2018-05-08 MED ORDER — SACUBITRIL-VALSARTAN 24-26 MG PO TABS: 1.0000 | ORAL_TABLET | Freq: Two times a day (BID) | ORAL | 0 refills | Status: DC

## 2018-05-08 MED ORDER — FUROSEMIDE 40 MG PO TABS: 40.0000 mg | ORAL_TABLET | Freq: Two times a day (BID) | ORAL | Status: DC

## 2018-05-08 MED ORDER — APIXABAN 5 MG PO TABS: 5.0000 mg | ORAL_TABLET | Freq: Two times a day (BID) | ORAL | 0 refills | Status: DC

## 2018-05-08 MED ORDER — METOPROLOL SUCCINATE ER 25 MG PO TB24: 25.0000 mg | ORAL_TABLET | Freq: Every day | ORAL | 0 refills | Status: DC

## 2018-05-08 MED ORDER — FUROSEMIDE 40 MG PO TABS: 40.0000 mg | ORAL_TABLET | Freq: Every day | ORAL | Status: DC

## 2018-05-08 MED ORDER — AMIODARONE HCL 400 MG PO TABS: 400.0000 mg | ORAL_TABLET | Freq: Two times a day (BID) | ORAL | 0 refills | Status: DC

## 2018-05-08 MED ORDER — FUROSEMIDE 40 MG PO TABS: 40.0000 mg | ORAL_TABLET | Freq: Every day | ORAL | 0 refills | Status: DC

## 2018-05-08 NOTE — Care Management (Signed)
Spoke with Dr. Sherryll Burger. Donald Molina has a new diagnosis of congestive heart failure, Could use skilled nursing for congestive heart failure teaching, Donald Molina, [facility] representative updated. Discharge to home today per Dr. Arther Dames s Donald Overlie RN MSN [facility] Care Management [phone]

## 2018-05-08 NOTE — Discharge Instructions (Signed)
Heart Failure °Heart failure means your heart has trouble pumping blood. This makes it hard for your body to work well. Heart failure is usually a long-term (chronic) condition. You must take good care of yourself and follow your doctor's treatment plan. °Follow these instructions at home: °· Take your heart medicine as told by your doctor. °? Do not stop taking medicine unless your doctor tells you to. °? Do not skip any dose of medicine. °? Refill your medicines before they run out. °? Take other medicines only as told by your doctor or pharmacist. °· Stay active if told by your doctor. The elderly and people with severe heart failure should talk with a doctor about physical activity. °· Eat heart-healthy foods. Choose foods that are without trans fat and are low in saturated fat, cholesterol, and salt (sodium). This includes fresh or frozen fruits and vegetables, fish, lean meats, fat-free or low-fat dairy foods, whole grains, and high-fiber foods. Lentils and dried peas and beans (legumes) are also good choices. °· Limit salt if told by your doctor. °· Cook in a healthy way. Roast, grill, broil, bake, poach, steam, or stir-fry foods. °· Limit fluids as told by your doctor. °· Weigh yourself every morning. Do this after you pee (urinate) and before you eat breakfast. Write down your weight to give to your doctor. °· Take your blood pressure and write it down if your doctor tells you to. °· Ask your doctor how to check your pulse. Check your pulse as told. °· Lose weight if told by your doctor. °· Stop smoking or chewing tobacco. Do not use gum or patches that help you quit without your doctor's approval. °· Schedule and go to doctor visits as told. °· Nonpregnant women should have no more than 1 drink a day. Men should have no more than 2 drinks a day. Talk to your doctor about drinking alcohol. °· Stop illegal drug use. °· Stay current with shots (immunizations). °· Manage your health conditions as told by your  doctor. °· Learn to manage your stress. °· Rest when you are tired. °· If it is really hot outside: °? Avoid intense activities. °? Use air conditioning or fans, or get in a cooler place. °? Avoid caffeine and alcohol. °? Wear loose-fitting, lightweight, and light-colored clothing. °· If it is really cold outside: °? Avoid intense activities. °? Layer your clothing. °? Wear mittens or gloves, a hat, and a scarf when going outside. °? Avoid alcohol. °· Learn about heart failure and get support as needed. °· Get help to maintain or improve your quality of life and your ability to care for yourself as needed. °Contact a doctor if: °· You gain weight quickly. °· You are more short of breath than usual. °· You cannot do your normal activities. °· You tire easily. °· You cough more than normal, especially with activity. °· You have any or more puffiness (swelling) in areas such as your hands, feet, ankles, or belly (abdomen). °· You cannot sleep because it is hard to breathe. °· You feel like your heart is beating fast (palpitations). °· You get dizzy or light-headed when you stand up. °Get help right away if: °· You have trouble breathing. °· There is a change in mental status, such as becoming less alert or not being able to focus. °· You have chest pain or discomfort. °· You faint. °This information is not intended to replace advice given to you by your health care provider. Make sure you   discuss any questions you have with your health care provider. °Document Released: 08/21/2008 Document Revised: 04/19/2016 Document Reviewed: 12/29/2012 °[employer]vier Interactive Patient Education © 2017 [employer] ° °

## 2018-05-08 NOTE — Progress Notes (Addendum)
Progress Note  Patient Name: Donald Molina Date of Encounter: 05/08/2018  Primary Cardiologist: Lorine Bears, MD  Subjective   No chest pain, dyspnea, or palpitations.  Recurrent Afib noted on tele this AM, but he was asymptomatic.  Inpatient Medications    Scheduled Meds: . amiodarone  400 mg Oral BID  . apixaban  5 mg Oral BID  . folic acid  1 mg Oral Daily  . furosemide  40 mg Intravenous BID  . metoprolol succinate  25 mg Oral Daily  . multivitamin with minerals  1 tablet Oral Daily  . sacubitril-valsartan  1 tablet Oral BID  . sodium chloride flush  3 mL Intravenous Q12H  . thiamine  100 mg Oral Daily   Or  . thiamine  100 mg Intravenous Daily   Continuous Infusions: . sodium chloride     PRN Meds: acetaminophen **OR** acetaminophen, hydrocortisone cream, ondansetron **OR** ondansetron (ZOFRAN) IV, sodium chloride flush   Vital Signs    Vitals:   05/07/18 1634 05/07/18 2024 05/08/18 0332 05/08/18 0829  BP: 116/87 92/63 117/83 120/88  Pulse: 73 76 71 74  Resp:  18 18   Temp: 97.7 F (36.5 C) 98 F (36.7 C) 98.5 F (36.9 C) 99.1 F (37.3 C)  TempSrc: Oral Oral Oral Oral  SpO2: 100% 99% 97% 100%  Weight:   173 lb 12.8 oz (78.8 kg)   Height:        Intake/Output Summary (Last 24 hours) at 05/08/2018 1034 Last data filed at 05/08/2018 1010 Gross per 24 hour  Intake 120 ml  Output 650 ml  Net -530 ml   Filed Weights   05/05/18 0852 05/07/18 1550 05/08/18 0332  Weight: 183 lb (83 kg) 176 lb 3.2 oz (79.9 kg) 173 lb 12.8 oz (78.8 kg)    Physical Exam   GEN: Well nourished, well developed, in no acute distress.  HEENT: Grossly normal.  Neck: Supple, no JVD, carotid bruits, or masses. Cardiac: RRR, no murmurs, rubs, or gallops. No clubbing, cyanosis, edema.  Radials/DP/PT 2+ and equal bilaterally.  Respiratory:  Respirations regular and unlabored, clear to auscultation bilaterally. GI: Soft, nontender, nondistended, BS + x 4. MS: no deformity or  atrophy. Skin: warm and dry, no rash. Neuro:  Strength and sensation are intact. Psych: AAOx3.  Normal affect.  Labs    Chemistry Recent Labs  Lab 05/03/18 1153  05/06/18 0221 05/06/18 1010 05/08/18 0614  NA 137   < > 136 136 136  K 3.8   < > 3.8 3.4* 4.3  CL 108   < > 105 102 105  CO2 21*   < > 23 23 24   GLUCOSE 108*   < > 110* 134* 98  BUN 15   < > 21* 20 20  CREATININE 0.84   < > 0.82 0.86 1.05  CALCIUM 8.5*   < > 8.8* 9.1 8.8*  PROT 7.3  --   --   --   --   ALBUMIN 3.8  --   --   --   --   AST 30  --   --   --   --   ALT 20  --   --   --   --   ALKPHOS 51  --   --   --   --   BILITOT 0.9  --   --   --   --   GFRNONAA >60   < > >60 >60 >60  GFRAA >60   < > >  60 >60 >60  ANIONGAP 8   < > 8 11 7    < > = values in this interval not displayed.     Hematology Recent Labs  Lab 05/03/18 1153 05/04/18 0138 05/07/18 0511  WBC 6.7 6.8 6.8  RBC 4.12* 4.14* 4.20*  HGB 13.3 13.2 13.4  HCT 37.1* 37.3* 38.1*  MCV 90.1 90.1 90.9  MCH 32.4 31.8 32.0  MCHC 36.0 35.3 35.2  RDW 12.4 12.5 12.9  PLT 216 202 186    Cardiac Enzymes Recent Labs  Lab 05/03/18 1153 05/03/18 1506 05/03/18 1851  TROPONINI <0.03 <0.03 <0.03     BNP Recent Labs  Lab 05/03/18 1153  BNP 1,561.0*      Radiology    No results found.  Telemetry    Predominantly sinus rhythm but he developed recurrent afib this AM, often brief episodes with short post-termination pauses.  He denies palps - Personally Reviewed  Cardiac Studies   TTE (05/03/18): - Left ventricle: The cavity size was normal. Systolic function was severely reduced. The estimated ejection fraction was in the range of 25% to 30%. Diffuse hypokinesis. Regional wall motion abnormalities cannot be excluded. The study is not technically sufficient to allow evaluation of LV diastolic function. - Aortic root: The aortic root was mildly dilated - Mitral valve: There was moderate regurgitation. - Left atrium: The atrium was  moderately dilated. - Right ventricle: The cavity size was mildly to moderately dilated. Wall thickness was normal. Systolic function was mildly reduced. - Right atrium: The atrium was mildly dilated. - Tricuspid valve: There was moderate regurgitation. - Pulmonary arteries: Systolic pressure was moderately elevated PA peak pressure: 52 mm Hg (S).  LHC/RHC (05/05/18): 1. Normal coronary arteries. 2. Severely reduced LV systolic function by echo. Left ventricular angiography was not performed. 3. Right heart catheterization showed severely elevated filling pressures, moderate pulmonary hypertension and severely reduced cardiac output. Cardiac output was 2.98 with a cardiac index of 1.47.  TEE (05/07/18; prelim): Severely reduced left and right heart contraction.  No LAA/LA thrombus.  Patient Profile     67 y.o. male man with acute systolic heart failure due to non-ischemic cardiomyopathy (likley tachy-mediated) and atrial fibrillation with rapid ventricular response.  Assessment & Plan    1.  Acute systolic CHF:  In setting of AFib RVR.  Feeling much better.  Minus overnight and 3.4L for admission.  Wt down 10 lbs since admission and 3 lbs since yesterday.  Euvolemic on exam this AM.  Creat up very slightly.  Will transition to lasix 40 PO BID.  Cont  blocker and entresto.  BP stable this AM but soft throughout the day yesterday.  Would not titrate entresto or add spiro @ this time.  Will need outpt f/u in about a week with bmet @ that time.  2.  Afib RVR/PAF:  S/p TEE/DCCV on 6/12.  Brief periods of PAF this AM w/ rates in the 90's.  Asymptomatic.  Cont Amio load (400mg  bid x 1 wk  200mg  bid x 1 wk  200mg  daily) and  blocker. Cont eliquis.  3.  Essential HTN/relative hypotension:  entresto held yesterday.  BP better this AM.  He received both metoprolol and entresto this AM. F/u bp later this AM.  If he does not tolerate entresto, would switch to losartan 25.  4.   Hypokalemia:  Stable this AM.  Signed, Nicolasa Ducking, NP  05/08/2018, 10:34 AM    For questions or updates, please contact  Please consult [email or web address] for contact info under Cardiology/STEMI.

## 2018-05-08 NOTE — Plan of Care (Signed)
Nutrition Education Note  RD consulted for nutrition education regarding new onset CHF.  Met with pt in room today. Pt reports good appetite and oral intake pta and today. Pt currently eating 100% of meals.   RD provided "Low Sodium Nutrition Therapy" handout from the Academy of [employer]. Reviewed patient's dietary recall. Provided examples on ways to decrease sodium intake in diet. Discouraged intake of processed foods and use of salt shaker. Encouraged fresh fruits and vegetables as well as whole grain sources of carbohydrates to maximize fiber intake.   RD discussed why it is important for patient to adhere to diet recommendations, and emphasized the role of fluids, foods to avoid, and importance of weighing self daily. Teach back method used.  Expect good compliance.  Body mass index is 24.24 kg/m. Pt meets criteria for normal weight based on current BMI.  Current diet order is HH, patient is consuming approximately 100% of meals at this time. Labs and medications reviewed. No further nutrition interventions warranted at this time. RD contact information provided. If additional nutrition issues arise, please re-consult RD.   Koleen Distance MS, RD, LDN Pager #- [phone] Office#- [phone] After Hours Pager: [phone]

## 2018-05-08 NOTE — Progress Notes (Signed)
Patient is post cardioversion. Patient has no acute event overnight, remained in NSR with stable VS. Patient has no complain, rested for most of the night,

## 2018-05-08 NOTE — Anesthesia Postprocedure Evaluation (Signed)
Anesthesia Post Note  Patient: Leverett Heinze  Procedure(s) Performed: TRANSESOPHAGEAL ECHOCARDIOGRAM (TEE) (N/A ) CARDIOVERSION (N/A )  Patient location during evaluation: PACU Anesthesia Type: General Level of consciousness: awake and alert Pain management: pain level controlled Vital Signs Assessment: post-procedure vital signs reviewed and stable Respiratory status: spontaneous breathing, nonlabored ventilation and respiratory function stable Cardiovascular status: blood pressure returned to baseline and stable Postop Assessment: no apparent nausea or vomiting Anesthetic complications: no     Last Vitals:  Vitals:   05/08/18 0332 05/08/18 0829  BP: 117/83 120/88  Pulse: 71 74  Resp: 18   Temp: 36.9 C 37.3 C  SpO2: 97% 100%    Last Pain:  Vitals:   05/08/18 0903  TempSrc:   PainSc: 0-No pain                 Christia Reading

## 2018-05-08 NOTE — Care Management Important Message (Signed)
Copy of signed IM left with patient in room.  

## 2018-05-08 NOTE — Progress Notes (Signed)
Cardiovascular and Pulmonary Nurse Navigator Note:    Patient for discharge today.   *CHF education completed by Crist Fat, RPH on 05/07/2018.  *Dietitian Consultation completed on 05/08/2018.  *Dr. Sherryll Burger informed this RN he would like patient enrolled in the [facility] HF protocol with ReDs vest.  Text paged Terrilee Croak, Case Manager to inform her.  Spoke with Lowella Dandy, RN, patient's assigned nurse about Dr. Margaretmary Eddy orders for [facility] HF Protocol.    CHF education reviewed with patient and nephew.  Patient gave this nurse permission to speak in front of his nephew.  The 5 Steps to Living Better with Heart Failure were reviewed with patient and nephew.  1. Patient reports that he has working scales at home.  Patient verbalized understanding of the need to weigh every morning after urinating and before eating or drinking.   2. Patient aware of need to compare symptoms on HF zone chart and call MD/PCP/or HF Clinic if weight increases 2 pounds overnight or 5 pounds in one week or call 911 if struggling to breathe.    3. Take medications as prescribed. 4. Follow low sodium heart healthy diet.  Limit fluids to 8-8 ounces of fluid per day.   5. Remain as active as possible.    Follow-ups scheduled:   Ward Givens, NP, 05/14/2018 at 1;30 p.m. [facility], 05/28/2018 at 12:20 p.m.  - New Patient Appt.    Patient and nephew asked pertinent questions and were appreciative of the review.   Army Melia, RN, BSN, [facility] Cardiovascular and Pulmonary Nurse Navigator

## 2018-05-10 NOTE — Discharge Summary (Signed)
[employer] [facility] [place] [facility]   PATIENT NAME: Donald Molina    MR#:  [id number]  DATE OF BIRTH:  November 02, 1951  DATE OF ADMISSION:  05/03/2018   ADMITTING PHYSICIAN: Alford Highland, MD  DATE OF DISCHARGE: 05/08/2018  3:53 PM  PRIMARY CARE PHYSICIAN: [facility]  ADMISSION DIAGNOSIS:  Acute pulmonary edema (HCC) [J81.0] Atrial fibrillation with RVR (HCC) [I48.91] DISCHARGE DIAGNOSIS:  Active Problems:   CHF (congestive heart failure) (HCC)   Atrial fibrillation with RVR (HCC)   Acute pulmonary edema (HCC)  SECONDARY DIAGNOSIS:   Past Medical History:  Diagnosis Date  . Hypertension    HOSPITAL COURSE:  67 y.o.maleman with acute systolic heart failure due to non-ischemic cardiomyopathy (likley tachy-mediated) and atrial fibrillation with rapid ventricular response.  1.  Acute systolic CHF:  In setting of AFib RVR.  Feeling much better. Negative 3.4L for admission.  Wt down 10 lbs since admission and 3 lbs since yesterday.  Euvolemic on exam this AM.  Creat up very slightly.  Will discharge on lasix 40 PO once daily due to slight bump in creatinine. Cont ? blocker and entresto at [facility].  BP stable this AM but soft throughout the day yesterday.  Will set up outpt cardio f/u in about a week - will need bmet @ that time.  2.  Afib RVR/PAF:  S/p TEE/DCCV on 6/12.  Brief periods of PAF this AM w/ rates in the 90's.  Asymptomatic.  Cont Amio load (400mg  bid x 1 wk  200mg  bid x 1 wk  200mg  daily) and ? blocker. Cont eliquis.  3.  Essential HTN/relative hypotension:  stable  4.  Hypokalemia:  repleted DISCHARGE CONDITIONS:  stable CONSULTS OBTAINED:  Treatment Team:  Iran Ouch, MD DRUG ALLERGIES:  No Known Allergies DISCHARGE MEDICATIONS:   Allergies as of 05/08/2018   No Known Allergies     Medication List    TAKE these medications   amiodarone 400 MG tablet Commonly known as:  PACERONE Take 1 tablet (400 mg total) by mouth 2  (two) times daily. Amiodarone 400 mg po BID for 3 days followed by 400 mg once daily   apixaban 5 MG Tabs tablet Commonly known as:  ELIQUIS Take 1 tablet (5 mg total) by mouth 2 (two) times daily.   furosemide 40 MG tablet Commonly known as:  LASIX Take 1 tablet (40 mg total) by mouth daily.   metoprolol succinate 25 MG 24 hr tablet Commonly known as:  TOPROL-XL Take 1 tablet (25 mg total) by mouth daily.   sacubitril-valsartan 24-26 MG Commonly known as:  ENTRESTO Take 1 tablet by mouth 2 (two) times daily.      DISCHARGE INSTRUCTIONS:   DIET:  Cardiac diet DISCHARGE CONDITION:  Good ACTIVITY:  Activity as tolerated OXYGEN:  Home Oxygen: No.  Oxygen Delivery: room air DISCHARGE LOCATION:  home   If you experience worsening of your admission symptoms, develop shortness of breath, life threatening emergency, suicidal or homicidal thoughts you must seek medical attention immediately by calling 911 or calling your MD immediately  if symptoms less severe.  You Must read complete instructions/literature along with all the possible adverse reactions/side effects for all the Medicines you take and that have been prescribed to you. Take any new Medicines after you have completely understood and accpet all the possible adverse reactions/side effects.   Please note  You were cared for by a hospitalist during your hospital stay. If you have any questions about  your discharge medications or the care you received while you were in the hospital after you are discharged, you can call the unit and asked to speak with the hospitalist on call if the hospitalist that took care of you is not available. Once you are discharged, your primary care physician will handle any further medical issues. Please note that NO REFILLS for any discharge medications will be authorized once you are discharged, as it is imperative that you return to your primary care physician (or establish a relationship with a  primary care physician if you do not have one) for your aftercare needs so that they can reassess your need for medications and monitor your lab values.    On the day of Discharge:  VITAL SIGNS:  Blood pressure 111/82, pulse 72, temperature 98.5 F (36.9 C), temperature source Oral, resp. rate 18, height 5\' 11"  (1.803 m), weight 78.8 kg (173 lb 12.8 oz), SpO2 100 %. PHYSICAL EXAMINATION:  GENERAL:  67 y.o.-year-old patient lying in the bed with no acute distress.  EYES: Pupils equal, round, reactive to light and accommodation. No scleral icterus. Extraocular muscles intact.  HEENT: Head atraumatic, normocephalic. Oropharynx and nasopharynx clear.  NECK:  Supple, no jugular venous distention. No thyroid enlargement, no tenderness.  LUNGS: Normal breath sounds bilaterally, no wheezing, rales,rhonchi or crepitation. No use of accessory muscles of respiration.  CARDIOVASCULAR: S1, S2 normal. No murmurs, rubs, or gallops.  ABDOMEN: Soft, non-tender, non-distended. Bowel sounds present. No organomegaly or mass.  EXTREMITIES: No pedal edema, cyanosis, or clubbing.  NEUROLOGIC: Cranial nerves II through XII are intact. Muscle strength 5/5 in all extremities. Sensation intact. Gait not checked.  PSYCHIATRIC: The patient is alert and oriented x 3.  SKIN: No obvious rash, lesion, or ulcer.  DATA REVIEW:   CBC Recent Labs  Lab 05/07/18 0511  WBC 6.8  HGB 13.4  HCT 38.1*  PLT 186    Chemistries  Recent Labs  Lab 05/08/18 0614  NA 136  K 4.3  CL 105  CO2 24  GLUCOSE 98  BUN 20  CREATININE 1.05  CALCIUM 8.8*  MG 1.9     Follow-up Information    [facility] Follow up on 05/28/2018.   Specialty:  Cardiology Why:  @ 12:20 p.m.  Contact information: [address] [place] [address] [phone]       Jenell Milliner, MD. Go on 05/26/2018.   Specialty:  Specialist Why:  at 0800AM Contact information: [address] [place] [address] [phone]        Iran Ouch, MD Follow up.   Specialty:  Cardiology Why:  Patient will see Marvis Repress on June 19 at 1:30pm Contact information: [address] [place] [address] [phone]           Management plans discussed with the patient, family and they are in agreement.  CODE STATUS: Prior   TOTAL TIME TAKING CARE OF THIS PATIENT: 45 minutes.    Delfino Lovett M.D on 05/10/2018 at 12:33 PM  Between 7am to 6pm - Pager - [phone]  After 6pm go to [email or web address] - Scientist, research (life sciences) [facility]  Office  [phone]  CC: Primary care physician; No primary care provider on file.   Note: This dictation was prepared with Dragon dictation along with smaller phrase technology. Any transcriptional errors that result from this process are unintentional.

## 2018-05-12 ENCOUNTER — Telehealth: Payer: Self-pay

## 2018-05-12 NOTE — Telephone Encounter (Signed)
Flagged on EMMI report for not knowing who to call about changes in condition.  First attempt to reach patient made 05/12/18 at 1:10pm, however no answer and unable to leave message.  Will attempt at later time.

## 2018-05-13 NOTE — Telephone Encounter (Signed)
Reached upon second attempt. Reminded patient of follow up appointments with Nicolasa Ducking, NP on 05/14/18 at 1:30pm and Jenell Milliner, MD on 05/26/18 at 8am.   Encouraged him to reach out to them about any changes in his condition.  I thanked him for his time and informed him he would receive one more automated call checking on him in the next few days.

## 2018-05-14 ENCOUNTER — Ambulatory Visit (INDEPENDENT_AMBULATORY_CARE_PROVIDER_SITE_OTHER): Payer: Medicare Other | Admitting: Nurse Practitioner

## 2018-05-14 ENCOUNTER — Encounter: Payer: Self-pay | Admitting: Nurse Practitioner

## 2018-05-14 VITALS — BP 98/64 | HR 93 | Ht 69.0 in | Wt 172.2 lb

## 2018-05-14 DIAGNOSIS — I4891 Unspecified atrial fibrillation: Secondary | ICD-10-CM | POA: Diagnosis not present

## 2018-05-14 DIAGNOSIS — I481 Persistent atrial fibrillation: Secondary | ICD-10-CM | POA: Diagnosis not present

## 2018-05-14 DIAGNOSIS — I428 Other cardiomyopathies: Secondary | ICD-10-CM

## 2018-05-14 DIAGNOSIS — I4819 Other persistent atrial fibrillation: Secondary | ICD-10-CM

## 2018-05-14 DIAGNOSIS — Z79899 Other long term (current) drug therapy: Secondary | ICD-10-CM | POA: Diagnosis not present

## 2018-05-14 DIAGNOSIS — I5022 Chronic systolic (congestive) heart failure: Secondary | ICD-10-CM | POA: Diagnosis not present

## 2018-05-14 DIAGNOSIS — I5021 Acute systolic (congestive) heart failure: Secondary | ICD-10-CM | POA: Diagnosis not present

## 2018-05-14 MED ORDER — AMIODARONE HCL 400 MG PO TABS: ORAL_TABLET | ORAL | 3 refills | Status: DC

## 2018-05-14 NOTE — Progress Notes (Signed)
Office Visit    Patient Name: Donald Molina Date of Encounter: 05/14/2018  Primary Care Provider:  Patient, No Pcp Per Primary Cardiologist:  Lorine Bears, MD  Chief Complaint    67 y/o ? with a h/o of HTN and recent finding of rapid afib, HFrEF, and NICM, who presents for f/u after recent hospitalization.  Past Medical History    Past Medical History:  Diagnosis Date  . HFrEF (heart failure with reduced ejection fraction) (HCC)    a. 04/2018 Echo: EF 25-30%.  . Hypertension   . NICM (nonischemic cardiomyopathy) (HCC)    a. 04/2018 Echo: EF 25-30%, diff HK, mildly dil Ao root, mod MR, mod dil LA, mildly to mod reduced RV fxn, mildly dil RA, PASP ; b. 04/2018 Cath: Nl cors. CO 2.98/CI 1.47.  Marland Kitchen Persistent atrial fibrillation (HCC)    a. Dx 04/2018 s/p DCCV and amio loading. CHA2DS2VASc 3-->eliquis.   Past Surgical History:  Procedure Laterality Date  . NO PAST SURGERIES    . RIGHT/LEFT HEART CATH AND CORONARY ANGIOGRAPHY N/A 05/05/2018   Procedure: RIGHT/LEFT HEART CATH AND CORONARY ANGIOGRAPHY;  Surgeon: Iran Ouch, MD;  Location: [facility] INVASIVE CV LAB;  Service: Cardiovascular;  Laterality: N/A;  . TEE WITHOUT CARDIOVERSION N/A 05/07/2018   Procedure: TRANSESOPHAGEAL ECHOCARDIOGRAM (TEE);  Surgeon: Yvonne Kendall, MD;  Location: [facility] ORS;  Service: Cardiovascular;  Laterality: N/A;    Allergies  No Known Allergies  History of Present Illness    67 year old male with the above past medical history including hypertension and recent diagnosis of HFrEF, nonischemic cardiomyopathy, and persistent atrial fibrillation.  Patient presented to [facility] in early June with a several week history of dyspnea and was found to be in rapid A. fib.  He was markedly volume overloaded.  He was admitted and placed on IV Lasix as well as amiodarone.  Echo showed an EF of 25 to 30%.  In that setting, catheterization was performed and revealed normal coronary arteries.  He  underwent cardioversion which was successful and was discharged home on amiodarone and Eliquis.  He was also placed on beta-blocker, Lasix, and Entresto.  Unfortunately, he never picked up his amiodarone, metoprolol, or Entresto.  He could not afford them at discharge but plans to pick them up today.  His sister is with him today and says she plans to help him out.  He has his Eliquis but is only been taking it once a day.  He says that since discharge his weight is been stable at approximately 172 to 173 pounds.  He says he has been feeling great without chest pain, dyspnea, palpitations, PND, orthopnea, dizziness, syncope, edema, or early satiety.  Unfortunately, he is back in A. fib/flutter today at a rate of 93.  Home Medications    Prior to Admission medications   Medication Sig Start Date End Date Taking? Authorizing Provider  amiodarone (PACERONE) 400 MG tablet Take 1 tablet (400 mg total) by mouth 2 (two) times daily. Amiodarone 400 mg po BID for 3 days followed by 400 mg once daily Patient taking differently: Take 400 mg by mouth daily.  05/08/18  Yes Delfino Lovett, MD  apixaban (ELIQUIS) 5 MG TABS tablet Take 1 tablet (5 mg total) by mouth 2 (two) times daily. 05/08/18  Yes Delfino Lovett, MD  furosemide (LASIX) 40 MG tablet Take 1 tablet (40 mg total) by mouth daily. 05/09/18  Yes Delfino Lovett, MD  metoprolol succinate (TOPROL-XL) 25 MG 24 hr tablet Take 1  tablet (25 mg total) by mouth daily. 05/09/18  Yes Delfino Lovett, MD  sacubitril-valsartan (ENTRESTO) 24-26 MG Take 1 tablet by mouth 2 (two) times daily. 05/08/18  Yes Delfino Lovett, MD    Review of Systems    He has been feeling well despite recurrence of atrial fibrillation.  He denies chest pain, palpitations, dyspnea, pnd, orthopnea, n, v, dizziness, syncope, edema, weight gain, or early satiety.  All other systems reviewed and are otherwise negative except as noted above.  Physical Exam    VS:  BP 98/64 (BP Location: Left Arm, Patient  Position: Sitting, Cuff Size: Normal)   Pulse 93   Ht 5\' 9"  (1.753 m)   Wt 172 lb 4 oz (78.1 kg)   BMI 25.44 kg/m  , BMI Body mass index is 25.44 kg/m. GEN: Well nourished, well developed, in no acute distress.  HEENT: normal.  Neck: Supple, no JVD, carotid bruits, or masses. Cardiac: IR, IR, no murmurs, rubs, or gallops. No clubbing, cyanosis, edema.  Radials/DP/PT 2+ and equal bilaterally.  Respiratory:  Respirations regular and unlabored, clear to auscultation bilaterally. GI: Soft, nontender, nondistended, BS + x 4. MS: no deformity or atrophy. Skin: warm and dry, no rash. Neuro:  Strength and sensation are intact. Psych: Normal affect.  Accessory Clinical Findings    ECG -coarse atrial fibrillation, 93, left axis deviation, LVH with repolarization abnormalities  Assessment & Plan    1.  Persistent atrial fibrillation: Patient was recently hospitalized with progressive dyspnea and finding of cardiomyopathy with an EF of 25 to 30% in the setting of rapid atrial fibrillation.  He was placed on amiodarone and Eliquis and underwent cardioversion, which was successful.  He was discharged home in sinus rhythm but due to finances did not pick up his amiodarone or metoprolol.  He is back in A. fib today.  Fortunately, he has been feeling well.  He has been on Eliquis but only once daily.  We had a discussion today about the importance of compliance.  His sister is with him today and they do plan to pick up his amiodarone and metoprolol today.  I encouraged him to remain compliant and take Eliquis twice a day as prescribed.  I will plan to see him back in approximately 2 to 3 weeks to reassess and set him up for cardioversion if he remains in A. Fib.  2.  HFrEF/nonischemic cardiomyopathy: EF 20 to 25% during recent hospitalization in the setting of atrial fibrillation.  Catheterization showed normal coronaries.  He was discharged on beta-blocker and Entresto but he did not pick up either of  those.  Blood pressure is only 98/64 today.  I am going to discontinue Entresto and have encouraged him to pick up the Toprol.  He has been taking Lasix and has been weighing himself daily and this is been stable.  He is euvolemic today.  We discussed the importance of daily weights, sodium restriction, medication compliance, and symptom reporting and he verbalizes understanding.  Plan to follow-up echo approximately 2 to 3 months after repeat cardioversion/restoration of sinus rhythm.  Follow-up basic metabolic panel today.  3.  Essential hypertension: Blood pressure soft today.  As above, I do not think he will tolerate Entresto.  He will start low-dose metoprolol.  4.  Disposition: Follow-up lab work today.  I will see him back in 2 to 3 weeks to reassess rhythm and volume and plan on cardioversion if necessary.   Nicolasa Ducking, NP 05/14/2018, 2:10 PM

## 2018-05-14 NOTE — Patient Instructions (Addendum)
Medication Instructions:  Your physician has recommended you make the following change in your medication:  1- STOP Entresto. 2- Make sure to start Amiodarone 400 mg (1 tablet) by mouth two times a day for 1 week, then 400 mg (1 tablet) by mouth once a day.   Labwork: Your physician recommends that you return for lab work in: TODAY (BMET).   Testing/Procedures: none  Follow-Up: Your physician recommends that you schedule a follow-up appointment in: 3 WEEKS WITH CHRIS.  If you need a refill on your cardiac medications before your next appointment, please call your pharmacy.   Medication Samples have been provided to the patient.  Drug name: ELIQUIS       Strength: 5MG         Qty: 1 BOX  LOT: [id number]  Exp.Date: June 2021

## 2018-05-15 LAB — BASIC METABOLIC PANEL
BUN / CREAT RATIO: 9 — AB (ref 10–24)
BUN: 10 mg/dL (ref 8–27)
CO2: 20 mmol/L (ref 20–29)
CREATININE: 1.09 mg/dL (ref 0.76–1.27)
Calcium: 9.8 mg/dL (ref 8.6–10.2)
Chloride: 105 mmol/L (ref 96–106)
GFR calc Af Amer: 81 mL/min/{1.73_m2} (ref >59)
GFR, EST NON AFRICAN AMERICAN: 70 mL/min/{1.73_m2} (ref >59)
GLUCOSE: 101 mg/dL — AB (ref 65–99)
Potassium: 4.7 mmol/L (ref 3.5–5.2)
SODIUM: 142 mmol/L (ref 134–144)

## 2018-05-26 DIAGNOSIS — I509 Heart failure, unspecified: Secondary | ICD-10-CM | POA: Diagnosis not present

## 2018-05-26 DIAGNOSIS — I429 Cardiomyopathy, unspecified: Secondary | ICD-10-CM | POA: Diagnosis not present

## 2018-05-26 DIAGNOSIS — I482 Chronic atrial fibrillation: Secondary | ICD-10-CM | POA: Diagnosis not present

## 2018-05-28 ENCOUNTER — Ambulatory Visit: Payer: Medicare Other | Attending: Family | Admitting: Family

## 2018-05-28 ENCOUNTER — Encounter: Payer: Self-pay | Admitting: Family

## 2018-05-28 VITALS — BP 139/79 | HR 58 | Resp 18 | Ht 69.0 in | Wt 178.5 lb

## 2018-05-28 DIAGNOSIS — I429 Cardiomyopathy, unspecified: Secondary | ICD-10-CM | POA: Diagnosis not present

## 2018-05-28 DIAGNOSIS — I110 Hypertensive heart disease with heart failure: Secondary | ICD-10-CM | POA: Insufficient documentation

## 2018-05-28 DIAGNOSIS — I4891 Unspecified atrial fibrillation: Secondary | ICD-10-CM | POA: Diagnosis not present

## 2018-05-28 DIAGNOSIS — Z9861 Coronary angioplasty status: Secondary | ICD-10-CM | POA: Diagnosis not present

## 2018-05-28 DIAGNOSIS — Z800 Family history of malignant neoplasm of digestive organs: Secondary | ICD-10-CM | POA: Insufficient documentation

## 2018-05-28 DIAGNOSIS — I10 Essential (primary) hypertension: Secondary | ICD-10-CM

## 2018-05-28 DIAGNOSIS — I5022 Chronic systolic (congestive) heart failure: Secondary | ICD-10-CM

## 2018-05-28 DIAGNOSIS — I509 Heart failure, unspecified: Secondary | ICD-10-CM | POA: Diagnosis not present

## 2018-05-28 DIAGNOSIS — R002 Palpitations: Secondary | ICD-10-CM | POA: Diagnosis present

## 2018-05-28 DIAGNOSIS — Z87891 Personal history of nicotine dependence: Secondary | ICD-10-CM | POA: Diagnosis not present

## 2018-05-28 DIAGNOSIS — Z79899 Other long term (current) drug therapy: Secondary | ICD-10-CM | POA: Diagnosis not present

## 2018-05-28 DIAGNOSIS — Z9889 Other specified postprocedural states: Secondary | ICD-10-CM | POA: Insufficient documentation

## 2018-05-28 NOTE — Patient Instructions (Signed)
Continue weighing daily and call for an overnight weight gain of > 2 pounds or a weekly weight gain of >5 pounds. 

## 2018-05-28 NOTE — Progress Notes (Signed)
Patient ID: Donald Molina, male    DOB: 29-Jun-1951, 67 y.o.   MRN: [id number]  HPI  Donald Molina is a 67 y/o male with a history of atrial fibrillation, HTN, remote tobacco use and chronic heart failure.   TEE and cardioversion report from 05/07/18 reviewed and showed an EF of 20-25% along with trivial AR, mild/mod Donald & TR and small pleural effusion. Echo report from 05/03/18 reviewed which showed an EF of 25-30% along with moderate Donald/TR and moderately elevated PA pressure of 52 mm Hg.   RHC/ LHC done 05/05/18 showed normal coronary arteries, severely elevated filling pressures, moderate pulmonary HTN and severely reduced cardiac output. CO was 2.98 with cardiac index of 1.47  Admitted 05/03/18 due to acute HF and AF with RVR. Lost 3.4L during admission. Cardiology consult obtained. Echo and TEE done prior to cardioversion. Catheterization also done. Initially needed IV lasix and then transitioned to oral diuretics. Other medications initiated and adjusted with discharge after 5 days.   He presents today for his initial visit with a chief complaint of palpitations. He describes this as acute in nature over the last few weeks and occur infrequently. He has no associated symptoms. He denies any difficulty sleeping, abdominal distention, pedal edema, chest pain, shortness of breath, fatigue, dizziness or weight gain.   Past Medical History:  Diagnosis Date  . Arrhythmia   . CHF (congestive heart failure) (HCC)   . HFrEF (heart failure with reduced ejection fraction) (HCC)    a. 04/2018 Echo: EF 25-30%.  . Hypertension   . NICM (nonischemic cardiomyopathy) (HCC)    a. 04/2018 Echo: EF 25-30%, diff HK, mildly dil Ao root, mod Donald, mod dil LA, mildly to mod reduced RV fxn, mildly dil RA, PASP ; b. 04/2018 Cath: Nl cors. CO 2.98/CI 1.47.  Marland Kitchen Persistent atrial fibrillation (HCC)    a. Dx 04/2018 s/p DCCV and amio loading. CHA2DS2VASc 3-->eliquis.   Past Surgical History:  Procedure Laterality Date  .  CARDIAC CATHETERIZATION    . RIGHT/LEFT HEART CATH AND CORONARY ANGIOGRAPHY N/A 05/05/2018   Procedure: RIGHT/LEFT HEART CATH AND CORONARY ANGIOGRAPHY;  Surgeon: Iran Ouch, MD;  Location: [facility]IVE [facility];  Service: Cardiovascular;  Laterality: N/A;  . TEE WITHOUT CARDIOVERSION N/A 05/07/2018   Procedure: TRANSESOPHAGEAL ECHOCARDIOGRAM (TEE);  Surgeon: Yvonne Kendall, MD;  Location: [facility] ORS;  Service: Cardiovascular;  Laterality: N/A;   Family History  Problem Relation Age of Onset  . Cancer Mother        pancreatic  . Cervical cancer Mother   . Dementia Father   . Heart failure Father   . Heart disease Sister    Social History   Tobacco Use  . Smoking status: Former Smoker    Years: 10.00    Last attempt to quit: 05/28/1998    Years since quitting: 20.0  . Smokeless tobacco: Former Neurosurgeon    Types: Chew    Quit date: 05/28/1998  Substance Use Topics  . Alcohol use: Yes    Comment: occassional 1 beer   No Known Allergies Prior to Admission medications   Medication Sig Start Date End Date Taking? Authorizing Provider  amiodarone (PACERONE) 400 MG tablet Take 1 tablet (400 mg) by mouth two times a day for 1 week, then take 1 tablet (400 mg) by mouth once a day. 05/14/18  Yes Creig Hines, NP  apixaban (ELIQUIS) 5 MG TABS tablet Take 1 tablet (5 mg total) by mouth 2 (two) times daily. 05/08/18  Yes Delfino Lovett, MD  furosemide (LASIX) 40 MG tablet Take 1 tablet (40 mg total) by mouth daily. 05/09/18  Yes Delfino Lovett, MD  metoprolol succinate (TOPROL-XL) 25 MG 24 hr tablet Take 1 tablet (25 mg total) by mouth daily. 05/09/18  Yes Delfino Lovett, MD   Review of Systems  Constitutional: Negative for appetite change and fatigue.  HENT: Negative for congestion, postnasal drip and sore throat.   Eyes: Negative.   Respiratory: Negative for chest tightness and shortness of breath.   Cardiovascular: Positive for palpitations (on occasion). Negative for chest pain and leg  swelling.  Gastrointestinal: Negative for abdominal distention and abdominal pain.  Endocrine: Negative.   Genitourinary: Negative.   Musculoskeletal: Negative for back pain and neck pain.  Skin: Negative.   Allergic/Immunologic: Negative.   Neurological: Negative for dizziness and light-headedness.  Hematological: Negative for adenopathy. Does not bruise/bleed easily.  Psychiatric/Behavioral: Negative for dysphoric mood and sleep disturbance (sleeping on 1 pillow). The patient is not nervous/anxious.    Vitals:   05/28/18 1225  BP: 139/79  Pulse: (!) 58  Resp: 18  SpO2: 100%  Weight: 178 lb 8 oz (81 kg)  Height: 5\' 9"  (1.753 m)   Wt Readings from Last 3 Encounters:  05/28/18 178 lb 8 oz (81 kg)  05/14/18 172 lb 4 oz (78.1 kg)  05/08/18 173 lb 12.8 oz (78.8 kg)   Lab Results  Component Value Date   CREATININE 1.09 05/14/2018   CREATININE 1.05 05/08/2018   CREATININE 0.86 05/06/2018    Physical Exam  Constitutional: He is oriented to person, place, and time. He appears well-developed and well-nourished.  HENT:  Head: Normocephalic and atraumatic.  Neck: Normal range of motion. Neck supple. No JVD present.  Cardiovascular: Regular rhythm. Bradycardia present.  Pulmonary/Chest: Effort normal and breath sounds normal. He has no wheezes. He has no rales.  Abdominal: Soft. He exhibits no distension.  Musculoskeletal: He exhibits no edema or tenderness.  Neurological: He is alert and oriented to person, place, and time.  Skin: Skin is warm and dry.  Psychiatric: He has a normal mood and affect. His behavior is normal. Thought content normal.  Nursing note and vitals reviewed.  Assessment & Plan:  1: Chronic heart failure with reduced ejection fraction- - NYHA class I - euvolemic today - weighing daily and he was instructed to call for an overnight weight gain of >2 pounds or a weekly weight gain of >5 pounds - not adding salt and trying to read food labels. Discussed the  importance of closely following a 2000mg  diet and written dietary information was given to him about this - has been walking ~ 20 minutes every morning - BP has been low recently but did discuss adding entresto at his next visit if his BP continues to look good - unable to titrate metoprolol succinate due to bradycardia - saw cardiology Brion Aliment) 05/14/18 - BNP 1561.0 on 05/03/18  2: Atrial fibrillation- - previously cardioverted 05/07/18 - currently regular rhythm   3: HTN- - BP looks good today - saw PCP ([facility]) 05/26/18 - BP has been low so entresto had been stopped during recent hospitalization - BMP 05/14/18 reviewed and showed sodium 142, potassium 4.7 and GFR 81.   Patient did not bring his medications nor a list. Each medication was verbally reviewed with the patient and he was encouraged to bring the bottles to every visit to confirm accuracy of list.  Return in 1 month or sooner for any questions/problems before  then.

## 2018-05-29 DIAGNOSIS — I10 Essential (primary) hypertension: Secondary | ICD-10-CM | POA: Insufficient documentation

## 2018-06-02 ENCOUNTER — Ambulatory Visit (INDEPENDENT_AMBULATORY_CARE_PROVIDER_SITE_OTHER): Payer: Medicare Other | Admitting: Nurse Practitioner

## 2018-06-02 ENCOUNTER — Ambulatory Visit: Payer: Medicare Other | Admitting: Nurse Practitioner

## 2018-06-02 ENCOUNTER — Encounter: Payer: Self-pay | Admitting: Nurse Practitioner

## 2018-06-02 VITALS — BP 158/100 | HR 50 | Ht 69.0 in | Wt 178.0 lb

## 2018-06-02 DIAGNOSIS — I428 Other cardiomyopathies: Secondary | ICD-10-CM | POA: Diagnosis not present

## 2018-06-02 DIAGNOSIS — R9431 Abnormal electrocardiogram [ECG] [EKG]: Secondary | ICD-10-CM

## 2018-06-02 DIAGNOSIS — I10 Essential (primary) hypertension: Secondary | ICD-10-CM | POA: Diagnosis not present

## 2018-06-02 DIAGNOSIS — I5022 Chronic systolic (congestive) heart failure: Secondary | ICD-10-CM | POA: Diagnosis not present

## 2018-06-02 DIAGNOSIS — I4819 Other persistent atrial fibrillation: Secondary | ICD-10-CM

## 2018-06-02 DIAGNOSIS — I481 Persistent atrial fibrillation: Secondary | ICD-10-CM | POA: Diagnosis not present

## 2018-06-02 MED ORDER — LOSARTAN POTASSIUM 25 MG PO TABS: 25.0000 mg | ORAL_TABLET | Freq: Every day | ORAL | 3 refills | Status: DC

## 2018-06-02 MED ORDER — AMIODARONE HCL 200 MG PO TABS: 200.0000 mg | ORAL_TABLET | Freq: Every day | ORAL | 3 refills | Status: DC

## 2018-06-02 NOTE — Progress Notes (Signed)
Office Visit    Patient Name: Donald Molina Date of Encounter: 06/02/2018  Primary Care Provider:  Patient, No Pcp Per Primary Cardiologist:  Lorine Bears, MD  Chief Complaint    -67 year old male with a history of hypertension and recent finding of rapid atrial fibrillation, HFrEF, and nonischemic cardia myopathy, who presents for follow-up.  Past Medical History    Past Medical History:  Diagnosis Date  . Arrhythmia   . CHF (congestive heart failure) (HCC)   . HFrEF (heart failure with reduced ejection fraction) (HCC)    a. 04/2018 Echo: EF 25-30%.  . Hypertension   . NICM (nonischemic cardiomyopathy) (HCC)    a. 04/2018 Echo: EF 25-30%, diff HK, mildly dil Ao root, mod MR, mod dil LA, mildly to mod reduced RV fxn, mildly dil RA, PASP ; b. 04/2018 Cath: Nl cors. CO 2.98/CI 1.47.  Marland Kitchen Persistent atrial fibrillation (HCC)    a. Dx 04/2018 s/p DCCV and amio loading. CHA2DS2VASc 3-->eliquis.   Past Surgical History:  Procedure Laterality Date  . CARDIAC CATHETERIZATION    . RIGHT/LEFT HEART CATH AND CORONARY ANGIOGRAPHY N/A 05/05/2018   Procedure: RIGHT/LEFT HEART CATH AND CORONARY ANGIOGRAPHY;  Surgeon: Iran Ouch, MD;  Location: [facility] INVASIVE CV LAB;  Service: Cardiovascular;  Laterality: N/A;  . TEE WITHOUT CARDIOVERSION N/A 05/07/2018   Procedure: TRANSESOPHAGEAL ECHOCARDIOGRAM (TEE);  Surgeon: Yvonne Kendall, MD;  Location: [facility] ORS;  Service: Cardiovascular;  Laterality: N/A;    Allergies  No Known Allergies  History of Present Illness    67 year old male with the above past medical history including hypertension and recent diagnosis of HFrEF, nonischemic cardiomyopathy, and persistent atrial fibrillation.  He was admitted to [facility] in early June with a several week history of dyspnea and was found to be in A. fib.  He was markedly volume overloaded.  Echo showed an EF of 25 to 30%.  Cardiac catheterization revealed normal coronary arteries.   After adequate diuresis and being placed on amiodarone therapy, he underwent cardioversion which was successful.  He was discharged home on beta-blocker, amiodarone, Eliquis, Lasix, and Entresto but at follow-up on June 19, he was only taking Lasix and once daily Eliquis.  He had issues affording the [facility].  At follow-up, blood pressure was only 98 systolic and we therefore discontinued Entresto (he had yet started).  He was also back in atrial fibrillation at that time.  He has since filled his amiodarone and metoprolol and has been taking his Eliquis appropriately.  When he was seen in  heart failure clinic on July 3, he was back in sinus rhythm.  He says he has been feeling great at home.  He is walking daily.  His weight has been 178 pounds pretty consistently on his home scale.  This is up from what he was at on our scale at his last visit but he says stable on his scale.  He denies chest pain, dyspnea, palpitations, PND, orthopnea, dizziness, syncope, edema, or early satiety.  Home Medications    Prior to Admission medications   Medication Sig Start Date End Date Taking? Authorizing Provider  amiodarone (PACERONE) 400 MG tablet Take 1 tablet (400 mg) by mouth two times a day for 1 week, then take 1 tablet (400 mg) by mouth once a day. Patient taking differently: Take 400 mg by mouth daily. Take 1 tablet (400 mg) by mouth two times a day for 1 week, then take 1 tablet (400 mg) by mouth once a day.  05/14/18  Yes Creig Hines, NP  apixaban (ELIQUIS) 5 MG TABS tablet Take 1 tablet (5 mg total) by mouth 2 (two) times daily. 05/08/18  Yes Delfino Lovett, MD  furosemide (LASIX) 40 MG tablet Take 1 tablet (40 mg total) by mouth daily. 05/09/18  Yes Delfino Lovett, MD  metoprolol succinate (TOPROL-XL) 25 MG 24 hr tablet Take 1 tablet (25 mg total) by mouth daily. 05/09/18  Yes Delfino Lovett, MD    Review of Systems    He denies chest pain, palpitations, dyspnea, pnd, orthopnea, n, v, dizziness,  syncope, edema, weight gain, or early satiety.  All other systems reviewed and are otherwise negative except as noted above.  Physical Exam    VS:  BP (!) 158/100 (BP Location: Left Arm, Patient Position: Sitting, Cuff Size: Normal)   Pulse (!) 50   Ht 5\' 9"  (1.753 m)   Wt 178 lb (80.7 kg)   BMI 26.29 kg/m  , BMI Body mass index is 26.29 kg/m. GEN: Well nourished, well developed, in no acute distress.  HEENT: normal.  Neck: Supple, no JVD, carotid bruits, or masses. Cardiac: RRR, no murmurs, rubs, or gallops. No clubbing, cyanosis, edema.  Radials/DP/PT 2+ and equal bilaterally.  Respiratory:  Respirations regular and unlabored, clear to auscultation bilaterally. GI: Soft, nontender, nondistended, BS + x 4. MS: no deformity or atrophy. Skin: warm and dry, no rash. Neuro:  Strength and sensation are intact. Psych: Normal affect.  Accessory Clinical Findings    ECG -sinus bradycardia, 50, left axis deviation, left anterior fascicular block, LVH, inferior and anterolateral T wave inversion.  Similar to prior ECG.  Assessment & Plan    1.  Persistent atrial fibrillation: Status post cardioversion in mid June with recurrent A. fib noted at office visit June 19.  With resumption of amiodarone therapy, he converted, and was in sinus rhythm on July 1 primary care visit and again on July 3 heart failure clinic visit.  He remains in sinus rhythm today.  He will continue amiodarone but I will reduce to 200 mg daily as he has completed load and QTC is widened at 525 ms.  Plan to follow-up basic metabolic panel and magnesium as well as repeat ECG in 1 week when he comes back for blood pressure check and labs.  Continue beta-blocker and Eliquis.  2.  HFrEF/nonischemic cardiomyopathy: EF 20 to 25% during hospitalization in mid June in the setting of atrial fibrillation.  Catheterization showed normal coronary arteries.  Weight has been stable at 178 pounds on his home scale.  He is euvolemic on exam  today and has been doing well at home without dyspnea or edema.  Continue beta-blocker and Lasix.  He was previously prescribed Entresto but could not afford it and then blood pressure was low and therefore it was discontinued.  Blood pressure higher today.  I repeated this and got 148/90.  I am adding losartan 25 mg daily.  Follow-up labs in a week with blood pressure check at that time.  Follow-up echo in mid to late August as it will have been greater than 40 days at that point.  3.  Essential hypertension: Blood pressure elevated today as outlined above.  Adding losartan 25 mg daily.  Follow-up basic metabolic panel in 1 week.  4.  Prolonged QT: In the setting of amiodarone therapy.  He was on 400 mg twice daily and is currently taking 400 daily.  I am reducing down to 200 mg daily.  Follow-up basic  metabolic panel and magnesium.  5.  Disposition: Follow-up labs, blood pressure, and EKG in a week.  He has heart failure clinic follow-up at the end of this month.  We will arrange for echo in mid to late August with follow-up shortly thereafter.   Nicolasa Ducking, NP 06/02/2018, 11:42 AM

## 2018-06-02 NOTE — Patient Instructions (Signed)
Medication Instructions:  Your physician has recommended you make the following change in your medication:  1- CHANGE Amiodarone to 200 mg by mouth once a day. 2- START Losartan 25 mg by mouth once a day.   Labwork: Your physician recommends that you return for lab work in: 1 week in the office at the same time as blood pressure check. (CBC, BMET).   Testing/Procedures: Your physician has requested that you have an echocardiogram in mid- August. Echocardiography is a painless test that uses sound waves to create images of your heart. It provides your doctor with information about the size and shape of your heart and how well your heart's chambers and valves are working. This procedure takes approximately one hour. There are no restrictions for this procedure. You may get an IV, if needed, to receive an ultrasound enhancing agent through to better visualize your heart.     Follow-Up: Your physician recommends that you schedule a follow-up appointment in: 1 week for blood pressure check and lab work in the office.    Your physician recommends that you schedule a follow-up appointment in: Late August with Dr Kirke Corin after echo has been done.  If you need a refill on your cardiac medications before your next appointment, please call your pharmacy.   Echocardiogram An echocardiogram, or echocardiography, uses sound waves (ultrasound) to produce an image of your heart. The echocardiogram is simple, painless, obtained within a short period of time, and offers valuable information to your health care provider. The images from an echocardiogram can provide information such as:  Evidence of coronary artery disease (CAD).  Heart size.  Heart muscle function.  Heart valve function.  Aneurysm detection.  Evidence of a past heart attack.  Fluid buildup around the heart.  Heart muscle thickening.  Assess heart valve function.  Tell a health care provider about:  Any allergies you  have.  All medicines you are taking, including vitamins, herbs, eye drops, creams, and over-the-counter medicines.  Any problems you or family members have had with anesthetic medicines.  Any blood disorders you have.  Any surgeries you have had.  Any medical conditions you have.  Whether you are pregnant or may be pregnant. What happens before the procedure? No special preparation is needed. Eat and drink normally. What happens during the procedure?  In order to produce an image of your heart, gel will be applied to your chest and a wand-like tool (transducer) will be moved over your chest. The gel will help transmit the sound waves from the transducer. The sound waves will harmlessly bounce off your heart to allow the heart images to be captured in real-time motion. These images will then be recorded.  You may need an IV to receive a medicine that improves the quality of the pictures. What happens after the procedure? You may return to your normal schedule including diet, activities, and medicines, unless your health care provider tells you otherwise. This information is not intended to replace advice given to you by your health care provider. Make sure you discuss any questions you have with your health care provider. Document Released: 11/09/2000 Document Revised: 06/30/2016 Document Reviewed: 07/20/2013 Elsevier Interactive Patient Education  2017 [employer].

## 2018-06-09 ENCOUNTER — Other Ambulatory Visit: Payer: Medicare Other

## 2018-06-09 ENCOUNTER — Telehealth: Payer: Self-pay | Admitting: Nurse Practitioner

## 2018-06-09 ENCOUNTER — Other Ambulatory Visit
Admission: RE | Admit: 2018-06-09 | Discharge: 2018-06-09 | Disposition: A | Discharge status: Home / Self Care | Payer: Medicare Other | Source: Ambulatory Visit | Attending: Nurse Practitioner | Admitting: Nurse Practitioner

## 2018-06-09 ENCOUNTER — Ambulatory Visit (INDEPENDENT_AMBULATORY_CARE_PROVIDER_SITE_OTHER): Payer: Medicare Other | Admitting: *Deleted

## 2018-06-09 VITALS — BP 139/87 | HR 53 | Ht 72.0 in

## 2018-06-09 DIAGNOSIS — Z79899 Other long term (current) drug therapy: Secondary | ICD-10-CM

## 2018-06-09 DIAGNOSIS — I5022 Chronic systolic (congestive) heart failure: Secondary | ICD-10-CM

## 2018-06-09 DIAGNOSIS — I4819 Other persistent atrial fibrillation: Secondary | ICD-10-CM

## 2018-06-09 DIAGNOSIS — I4891 Unspecified atrial fibrillation: Secondary | ICD-10-CM

## 2018-06-09 DIAGNOSIS — I10 Essential (primary) hypertension: Secondary | ICD-10-CM | POA: Diagnosis not present

## 2018-06-09 LAB — CBC WITH DIFFERENTIAL/PLATELET
BASOS ABS: 0 10*3/uL (ref 0–0.1)
Basophils Relative: 1 %
Eosinophils Absolute: 0.2 10*3/uL (ref 0–0.7)
Eosinophils Relative: 3 %
HCT: 40.1 % (ref 40.0–52.0)
Hemoglobin: 14.3 g/dL (ref 13.0–18.0)
LYMPHS PCT: 19 %
Lymphs Abs: 1.3 10*3/uL (ref 1.0–3.6)
MCH: 31.8 pg (ref 26.0–34.0)
MCHC: 35.6 g/dL (ref 32.0–36.0)
MCV: 89.2 fL (ref 80.0–100.0)
MONO ABS: 0.7 10*3/uL (ref 0.2–1.0)
Monocytes Relative: 10 %
NEUTROS ABS: 4.8 10*3/uL (ref 1.4–6.5)
NEUTROS PCT: 67 %
Platelets: 133 10*3/uL — ABNORMAL LOW (ref 150–440)
RBC: 4.49 MIL/uL (ref 4.40–5.90)
RDW: 13.8 % (ref 11.5–14.5)
WBC: 7.1 10*3/uL (ref 3.8–10.6)

## 2018-06-09 LAB — BASIC METABOLIC PANEL
Anion gap: 8 (ref 5–15)
BUN: 13 mg/dL (ref 8–23)
CALCIUM: 9.1 mg/dL (ref 8.9–10.3)
CO2: 30 mmol/L (ref 22–32)
CREATININE: 0.92 mg/dL (ref 0.61–1.24)
Chloride: 104 mmol/L (ref 98–111)
GFR calc non Af Amer: >60 mL/min (ref >60)
Glucose, Bld: 105 mg/dL — ABNORMAL HIGH (ref 70–99)
Potassium: 4.7 mmol/L (ref 3.5–5.1)
Sodium: 142 mmol/L (ref 135–145)

## 2018-06-09 NOTE — Progress Notes (Signed)
1) BP is improved, though not optimal. Pending bmet drawn today, would plan to increase losartan to 50 mg daily. Await bmet. Could add spironolactone in follow up. Heart rate precludes titration of beta blocker at this time.   2) It appears patient was to have an EKG done with this visit today as well. If not done, please have him come back for EKG.

## 2018-06-09 NOTE — Telephone Encounter (Signed)
I agree with recommendations from Eula Listen below:  Renal fxn stable on bmet drawn today.  Increase losartan to 50mg  daily.  BMET in 1 wk.  He was to have an ECG today, but this can be done when he returns for labs next week.  Nicolasa Ducking, NP 06/09/2018, 6:09 PM   Status: Signed    1. Reason for visit: BP check  2. Name of MD requesting visit: Ward Givens, NP  3. H&P: HTN, a fib  4. ROS related to problem: Patient was last here on 06/02/18 and losartan 25 mg daily added. Denies any chest pain, shortness of breath, swelling, headache or dizziness today. BP 139/87, HR 53. Unable to draw blood in office. [facility] sent to [facility] for lab work.  5. Assessment and plan per MD: Patient instructed to continue current medications and keep current follow up at this time and will call him with any further recommendations. Routing visit to ordering provider.         Sondra Barges, PA-C at 06/09/2018 9:30 AM   Status: Signed    1) BP is improved, though not optimal. Pending bmet drawn today, would plan to increase losartan to 50 mg daily. Await bmet. Could add spironolactone in follow up. Heart rate precludes titration of beta blocker at this time.   2) It appears patient was to have an EKG done with this visit today as well. If not done, please have him come back for EKG.

## 2018-06-09 NOTE — Progress Notes (Signed)
1.) Reason for visit: BP check  2.) Name of MD requesting visit: Ward Givens, NP  3.) H&P: HTN, a fib  4.) ROS related to problem: Patient was last here on 06/02/18 and losartan 25 mg daily added. Denies any chest pain, shortness of breath, swelling, headache or dizziness today. BP 139/87, HR 53. Unable to draw blood in office. Patient sent to [facility] for lab work.  5.) Assessment and plan per MD: Patient instructed to continue current medications and keep current follow up at this time and will call him with any further recommendations. Routing visit to ordering provider.

## 2018-06-10 MED ORDER — LOSARTAN POTASSIUM 50 MG PO TABS: 50.0000 mg | ORAL_TABLET | Freq: Every day | ORAL | 3 refills | Status: DC

## 2018-06-10 NOTE — Telephone Encounter (Signed)
No answer.  No voicemail setup.   Will try again later.

## 2018-06-10 NOTE — Telephone Encounter (Signed)
S/w patient. He verbalized understanding to increase losartan to 50 mg daily and to get blood work and EKG in 1 week. Rx sent to pharmacy. He also verbalized understanding that lab work was stable as well. Transferred to scheduler to schedule lab and EKG check.

## 2018-06-11 ENCOUNTER — Emergency Department
Admission: EM | Admit: 2018-06-11 | Discharge: 2018-06-11 | Discharge status: Against Medical Advice | Payer: Medicare Other | Attending: Emergency Medicine | Admitting: Emergency Medicine

## 2018-06-11 ENCOUNTER — Other Ambulatory Visit: Payer: Self-pay

## 2018-06-11 ENCOUNTER — Encounter: Payer: Self-pay | Admitting: Emergency Medicine

## 2018-06-11 DIAGNOSIS — Y939 Activity, unspecified: Secondary | ICD-10-CM | POA: Insufficient documentation

## 2018-06-11 DIAGNOSIS — X58XXXA Exposure to other specified factors, initial encounter: Secondary | ICD-10-CM | POA: Diagnosis not present

## 2018-06-11 DIAGNOSIS — Y999 Unspecified external cause status: Secondary | ICD-10-CM | POA: Insufficient documentation

## 2018-06-11 DIAGNOSIS — Y929 Unspecified place or not applicable: Secondary | ICD-10-CM | POA: Diagnosis not present

## 2018-06-11 DIAGNOSIS — Z5321 Procedure and treatment not carried out due to patient leaving prior to being seen by health care provider: Secondary | ICD-10-CM | POA: Insufficient documentation

## 2018-06-11 DIAGNOSIS — T1510XA Foreign body in conjunctival sac, unspecified eye, initial encounter: Secondary | ICD-10-CM | POA: Diagnosis not present

## 2018-06-11 NOTE — ED Triage Notes (Signed)
Patient to ER for c/o feeling like he has something in his eye after mowing today.

## 2018-06-16 ENCOUNTER — Other Ambulatory Visit: Payer: Self-pay

## 2018-06-16 ENCOUNTER — Telehealth: Payer: Self-pay | Admitting: Cardiovascular Disease

## 2018-06-16 MED ORDER — FUROSEMIDE 40 MG PO TABS: 40.0000 mg | ORAL_TABLET | Freq: Every day | ORAL | 0 refills | Status: DC

## 2018-06-16 NOTE — Telephone Encounter (Signed)
°*  STAT* If patient is at the pharmacy, call can be transferred to refill team.   1. Which medications need to be refilled? (please list name of each medication and dose if known) furosemide 40 mg po q d   2. Which pharmacy/location (including street and city if local pharmacy) is medication to be sent to? [facility] [address] [place]   3. Do they need a 30 day or 90 day supply? 90

## 2018-06-16 NOTE — Telephone Encounter (Signed)
furosemide (LASIX) 40 MG tablet 90 tablet 0 06/16/2018    Sig - Route: Take 1 tablet (40 mg total) by mouth daily. - Oral   Sent to pharmacy as: furosemide (LASIX) 40 MG tablet   E-Prescribing Status: Receipt confirmed by pharmacy (06/16/2018 11:33 AM EDT)   Pharmacy   [phone] [place], [place] - [address]

## 2018-06-20 ENCOUNTER — Ambulatory Visit (INDEPENDENT_AMBULATORY_CARE_PROVIDER_SITE_OTHER): Payer: Medicare Other | Admitting: *Deleted

## 2018-06-20 ENCOUNTER — Other Ambulatory Visit (INDEPENDENT_AMBULATORY_CARE_PROVIDER_SITE_OTHER): Payer: Medicare Other

## 2018-06-20 VITALS — BP 135/87 | HR 52 | Ht 69.0 in

## 2018-06-20 DIAGNOSIS — I4891 Unspecified atrial fibrillation: Secondary | ICD-10-CM | POA: Diagnosis not present

## 2018-06-20 DIAGNOSIS — Z79899 Other long term (current) drug therapy: Secondary | ICD-10-CM

## 2018-06-20 DIAGNOSIS — I10 Essential (primary) hypertension: Secondary | ICD-10-CM | POA: Diagnosis not present

## 2018-06-20 DIAGNOSIS — I5022 Chronic systolic (congestive) heart failure: Secondary | ICD-10-CM

## 2018-06-20 NOTE — Progress Notes (Signed)
1.) Reason for visit: EKG check and lab work  2.) Name of MD requesting visit: Ward Givens, NP  3.) H&P: a fib, HTN  4.) ROS related to problem: Patient denies any complaints of chest pain, shortness of breath, dizziness, or headache. Patient states "He feels fine."  Today, BP 135/87, HR 52 per EKG, 97% on RA. Med list reviewed and patient is taking medications as listed.   5.) Assessment and plan per MD: EKG shown to Dr Mariah Milling and reviewed. Continue current medications and keep scheduled follow up. Routing to Ward Givens, NP for review as well.

## 2018-06-21 LAB — BASIC METABOLIC PANEL
BUN / CREAT RATIO: 11 (ref 10–24)
BUN: 12 mg/dL (ref 8–27)
CO2: 27 mmol/L (ref 20–29)
CREATININE: 1.09 mg/dL (ref 0.76–1.27)
Calcium: 10.1 mg/dL (ref 8.6–10.2)
Chloride: 97 mmol/L (ref 96–106)
GFR calc Af Amer: 81 mL/min/{1.73_m2} (ref >59)
GFR, EST NON AFRICAN AMERICAN: 70 mL/min/{1.73_m2} (ref >59)
GLUCOSE: 87 mg/dL (ref 65–99)
Potassium: 4.5 mmol/L (ref 3.5–5.2)
Sodium: 139 mmol/L (ref 134–144)

## 2018-06-23 ENCOUNTER — Other Ambulatory Visit (INDEPENDENT_AMBULATORY_CARE_PROVIDER_SITE_OTHER): Payer: Medicare Other | Admitting: *Deleted

## 2018-06-23 DIAGNOSIS — I10 Essential (primary) hypertension: Secondary | ICD-10-CM | POA: Diagnosis not present

## 2018-06-23 DIAGNOSIS — I4891 Unspecified atrial fibrillation: Secondary | ICD-10-CM | POA: Diagnosis not present

## 2018-06-24 ENCOUNTER — Encounter: Payer: Self-pay | Admitting: Family

## 2018-06-24 ENCOUNTER — Other Ambulatory Visit: Payer: Self-pay

## 2018-06-24 ENCOUNTER — Ambulatory Visit: Payer: Medicare Other | Attending: Family | Admitting: Family

## 2018-06-24 VITALS — BP 127/68 | HR 62 | Resp 18 | Ht 69.0 in | Wt 179.0 lb

## 2018-06-24 DIAGNOSIS — I481 Persistent atrial fibrillation: Secondary | ICD-10-CM | POA: Diagnosis not present

## 2018-06-24 DIAGNOSIS — Z79899 Other long term (current) drug therapy: Secondary | ICD-10-CM | POA: Insufficient documentation

## 2018-06-24 DIAGNOSIS — I110 Hypertensive heart disease with heart failure: Secondary | ICD-10-CM | POA: Insufficient documentation

## 2018-06-24 DIAGNOSIS — I5022 Chronic systolic (congestive) heart failure: Secondary | ICD-10-CM

## 2018-06-24 DIAGNOSIS — I272 Pulmonary hypertension, unspecified: Secondary | ICD-10-CM | POA: Diagnosis not present

## 2018-06-24 DIAGNOSIS — Z800 Family history of malignant neoplasm of digestive organs: Secondary | ICD-10-CM | POA: Insufficient documentation

## 2018-06-24 DIAGNOSIS — Z818 Family history of other mental and behavioral disorders: Secondary | ICD-10-CM | POA: Diagnosis not present

## 2018-06-24 DIAGNOSIS — Z8249 Family history of ischemic heart disease and other diseases of the circulatory system: Secondary | ICD-10-CM | POA: Diagnosis present

## 2018-06-24 DIAGNOSIS — Z9889 Other specified postprocedural states: Secondary | ICD-10-CM | POA: Diagnosis not present

## 2018-06-24 DIAGNOSIS — I10 Essential (primary) hypertension: Secondary | ICD-10-CM

## 2018-06-24 DIAGNOSIS — Z87891 Personal history of nicotine dependence: Secondary | ICD-10-CM | POA: Insufficient documentation

## 2018-06-24 DIAGNOSIS — Z8049 Family history of malignant neoplasm of other genital organs: Secondary | ICD-10-CM | POA: Diagnosis not present

## 2018-06-24 DIAGNOSIS — Z7901 Long term (current) use of anticoagulants: Secondary | ICD-10-CM | POA: Diagnosis not present

## 2018-06-24 DIAGNOSIS — Z09 Encounter for follow-up examination after completed treatment for conditions other than malignant neoplasm: Secondary | ICD-10-CM | POA: Diagnosis not present

## 2018-06-24 NOTE — Progress Notes (Signed)
Patient ID: Donald Molina, male    DOB: 1950/12/11, 67 y.o.   MRN: [id number]  HPI  Donald Molina is a 67 y/o male with a history of atrial fibrillation, HTN, remote tobacco use and chronic heart failure.   TEE and cardioversion report from 05/07/18 reviewed and showed an EF of 20-25% along with trivial AR, mild/mod Donald & TR and small pleural effusion. Echo report from 05/03/18 reviewed which showed an EF of 25-30% along with moderate Donald/TR and moderately elevated PA pressure of 52 mm Hg.   RHC/ LHC done 05/05/18 showed normal coronary arteries, severely elevated filling pressures, moderate pulmonary HTN and severely reduced cardiac output. CO was 2.98 with cardiac index of 1.47  Was in the ED 06/11/18 but left without being seen. Admitted 05/03/18 due to acute HF and AF with RVR. Lost 3.4L during admission. Cardiology consult obtained. Echo and TEE done prior to cardioversion. Catheterization also done. Initially needed IV lasix and then transitioned to oral diuretics. Other medications initiated and adjusted with discharge after 5 days.   He presents today with a chief complaint of a follow-up visit. He currently denies any fatigue, cough, shortness of breath, chest pain, pedal edema, palpitations, abdominal distention, dizziness or weight gain. Overall he says that he feels really good. Going to get established with primary care at [facility].    Past Medical History:  Diagnosis Date  . Arrhythmia   . CHF (congestive heart failure) (HCC)   . HFrEF (heart failure with reduced ejection fraction) (HCC)    a. 04/2018 Echo: EF 25-30%.  . Hypertension   . NICM (nonischemic cardiomyopathy) (HCC)    a. 04/2018 Echo: EF 25-30%, diff HK, mildly dil Ao root, mod Donald, mod dil LA, mildly to mod reduced RV fxn, mildly dil RA, PASP ; b. 04/2018 Cath: Nl cors. CO 2.98/CI 1.47.  Marland Kitchen Persistent atrial fibrillation (HCC)    a. Dx 04/2018 s/p DCCV and amio loading. CHA2DS2VASc 3-->eliquis.   Past Surgical History:   Procedure Laterality Date  . CARDIAC CATHETERIZATION    . RIGHT/LEFT HEART CATH AND CORONARY ANGIOGRAPHY N/A 05/05/2018   Procedure: RIGHT/LEFT HEART CATH AND CORONARY ANGIOGRAPHY;  Surgeon: Iran Ouch, MD;  Location: [facility] INVASIVE CV LAB;  Service: Cardiovascular;  Laterality: N/A;  . TEE WITHOUT CARDIOVERSION N/A 05/07/2018   Procedure: TRANSESOPHAGEAL ECHOCARDIOGRAM (TEE);  Surgeon: Yvonne Kendall, MD;  Location: [facility] ORS;  Service: Cardiovascular;  Laterality: N/A;   Family History  Problem Relation Age of Onset  . Cancer Mother        pancreatic  . Cervical cancer Mother   . Dementia Father   . Heart failure Father   . Heart disease Sister    Social History   Tobacco Use  . Smoking status: Former Smoker    Years: 10.00    Last attempt to quit: 05/28/1998    Years since quitting: 20.0  . Smokeless tobacco: Former Neurosurgeon    Types: Chew    Quit date: 05/28/1998  Substance Use Topics  . Alcohol use: Yes    Comment: occassional 1 beer   No Known Allergies   Prior to Admission medications   Medication Sig Start Date End Date Taking? Authorizing Provider  amiodarone (PACERONE) 400 MG tablet Take 1 tablet (400 mg) by mouth two times a day for 1 week, then take 1 tablet (400 mg) by mouth once a day. 05/14/18  Yes Creig Hines, NP  apixaban (ELIQUIS) 5 MG TABS tablet Take 1 tablet (  5 mg total) by mouth 2 (two) times daily. 05/08/18  Yes Delfino Lovett, MD  furosemide (LASIX) 40 MG tablet Take 1 tablet (40 mg total) by mouth daily. 05/09/18  Yes Delfino Lovett, MD  metoprolol succinate (TOPROL-XL) 25 MG 24 hr tablet Take 1 tablet (25 mg total) by mouth daily. 05/09/18  Yes Delfino Lovett, MD   Review of Systems  Constitutional: Negative for appetite change and fatigue.  HENT: Negative for congestion, postnasal drip and sore throat.   Eyes: Negative.   Respiratory: Negative for cough, chest tightness and shortness of breath.   Cardiovascular: Negative for palpitations and leg  swelling.  Gastrointestinal: Negative for abdominal distention and abdominal pain.  Endocrine: Negative.   Genitourinary: Negative.   Musculoskeletal: Negative for back pain and neck pain.  Skin: Negative.   Allergic/Immunologic: Negative.   Neurological: Negative for dizziness and light-headedness.  Hematological: Negative for adenopathy. Does not bruise/bleed easily.  Psychiatric/Behavioral: Negative for dysphoric mood and sleep disturbance (sleeping on 1 pillow). The patient is not nervous/anxious.    Vitals:   06/24/18 0851  BP: 127/68  Pulse: 62  Resp: 18  SpO2: 99%  Weight: 179 lb (81.2 kg)  Height: 5\' 9"  (1.753 m)   Wt Readings from Last 3 Encounters:  06/24/18 179 lb (81.2 kg)  06/11/18 180 lb (81.6 kg)  06/02/18 178 lb (80.7 kg)   Lab Results  Component Value Date   CREATININE 1.09 06/20/2018   CREATININE 0.92 06/09/2018   CREATININE 1.09 05/14/2018    Physical Exam  Constitutional: He is oriented to person, place, and time. He appears well-developed and well-nourished.  HENT:  Head: Normocephalic and atraumatic.  Neck: Normal range of motion. Neck supple. No JVD present.  Cardiovascular: Regular rhythm. Bradycardia present.  Pulmonary/Chest: Effort normal and breath sounds normal. He has no wheezes. He has no rales.  Abdominal: Soft. He exhibits no distension.  Musculoskeletal: He exhibits no edema or tenderness.  Neurological: He is alert and oriented to person, place, and time.  Skin: Skin is warm and dry.  Psychiatric: He has a normal mood and affect. His behavior is normal. Thought content normal.  Nursing note and vitals reviewed.  Assessment & Plan:  1: Chronic heart failure with reduced ejection fraction- - NYHA class I - euvolemic today - weighing daily and he was reminded to call for an overnight weight gain of >2 pounds or a weekly weight gain of >5 pounds - weight unchanged from last time he was here ~ 1 month ago - not adding salt and trying  to read food labels. Reminded to closely follow a 2000mg  diet  - has been walking ~ 20 minutes every morning - losartan has been added and titrated up to 50mg  daily - unable to titrate metoprolol succinate due to bradycardia - saw cardiology [facility]) 06/02/18 - BNP 1561.0 on 05/03/18  2: HTN- - BP looks good today - saw PCP [facility] 05/26/18 but is going to get established with [facility] - has become hypotensive with entresto in the past - BMP 06/20/18 reviewed and showed sodium 139, potassium 4.5 and GFR 81.   Patient did not bring his medications nor a list. Each medication was verbally reviewed with the patient and he was encouraged to bring the bottles to every visit to confirm accuracy of list.  Return as needed for any questions/problems in the future

## 2018-06-24 NOTE — Patient Instructions (Signed)
Continue weighing daily and call for an overnight weight gain of > 2 pounds or a weekly weight gain of >5 pounds. 

## 2018-07-08 ENCOUNTER — Other Ambulatory Visit: Payer: Self-pay | Admitting: Nurse Practitioner

## 2018-07-08 ENCOUNTER — Ambulatory Visit (INDEPENDENT_AMBULATORY_CARE_PROVIDER_SITE_OTHER): Payer: Medicare Other

## 2018-07-08 ENCOUNTER — Other Ambulatory Visit: Payer: Self-pay

## 2018-07-08 DIAGNOSIS — I428 Other cardiomyopathies: Secondary | ICD-10-CM

## 2018-07-22 ENCOUNTER — Ambulatory Visit: Payer: Medicare Other | Admitting: Physician Assistant

## 2018-07-22 ENCOUNTER — Encounter: Payer: Self-pay | Admitting: Physician Assistant

## 2018-07-22 NOTE — Progress Notes (Deleted)
Cardiology Office Note Date:  07/22/2018  Patient ID:  Donald, Molina 1950-12-16, MRN [id number] PCP:  Patient, No Pcp Per  Cardiologist:  Dr. Kirke Corin, MD  ***refresh   Chief Complaint: Follow up  History of Present Illness: Donald Molina is a 67 y.o. male with history of HFrEF secondary to NICM, persistent Afib on Eliquis, and HTN who presents for follow up of his NICM and Afib.  He was admitted to [facility] in 04/2018 with a several week history of dyspnea and was found to be in Afib. He was markedly volume overloaded. Echo showed an EF of 25 to 30%. Cardiac catheterization revealed normal coronary arteries. After adequate diuresis and being placed on amiodarone therapy, he underwent successful DCCV.  He was discharged home on beta-blocker, amiodarone, Eliquis, Lasix, and Entresto but at follow-up on 6/19, he was only taking Lasix and once daily Eliquis. He had issues affording the [facility]. His BP was 98 mmHg systolic leading to discontinuation of Entresto (he had not yet started). He was also back in Afib at that time. He was advised to take Eliquis bid along with his metoprolol and amiodarone. When he was seen in heart failure clinic on 7/3, he was back in sinus rhythm. He was most recently seen in our office on 06/02/18 and was doing well. His weight had been 178 pounds pretty consistently on his home scale. His office weight was also 178 pounds, which was 6 pounds up from his 04/2018 office visit. He remained in sinus rhythm with a bradycardic rate. RN visit on 06/09/18 showed an improved BP of 139/8. Labs checked at that time showed stable renal function. His losartan was increased to 50 mg daily. Repeat RN visit on 06/20/18 showed a BP of 135/87. EKG showed sinus bradycardia, 52 bpm, left axis deviation, TWI along inferior and anterolateral leads (unchanged from prior). Follow up bmet on 06/20/18 showed stable SCr of 1.09, K+ 4.5. He was seen by the [facility] on 7/30 with a BP of  127/68, weight 179 pounds, heart rate 62 bpm. No changes were made.   ***  Past Medical History:  Diagnosis Date  . HFrEF (heart failure with reduced ejection fraction) (HCC)    a. 04/2018 Echo: EF 25-30%.  . Hypertension   . NICM (nonischemic cardiomyopathy) (HCC)    a. 04/2018 Echo: EF 25-30%, diff HK, mildly dil Ao root, mod MR, mod dil LA, mildly to mod reduced RV fxn, mildly dil RA, PASP ; b. 04/2018 Cath: Nl cors. CO 2.98/CI 1.47.  Marland Kitchen Persistent atrial fibrillation (HCC)    a. Dx 04/2018 s/p DCCV and amio loading. CHA2DS2VASc 3-->eliquis.    Past Surgical History:  Procedure Laterality Date  . CARDIAC CATHETERIZATION    . RIGHT/LEFT HEART CATH AND CORONARY ANGIOGRAPHY N/A 05/05/2018   Procedure: RIGHT/LEFT HEART CATH AND CORONARY ANGIOGRAPHY;  Surgeon: Iran Ouch, MD;  Location: [facility] INVASIVE CV LAB;  Service: Cardiovascular;  Laterality: N/A;  . TEE WITHOUT CARDIOVERSION N/A 05/07/2018   Procedure: TRANSESOPHAGEAL ECHOCARDIOGRAM (TEE);  Surgeon: Yvonne Kendall, MD;  Location: [facility] ORS;  Service: Cardiovascular;  Laterality: N/A;    No outpatient medications have been marked as taking for the 07/22/18 encounter (Appointment) with Sondra Barges, PA-C.    Allergies:   Patient has no known allergies.   Social History:  The patient  reports that he quit smoking about 20 years ago. He quit after 10.00 years of use. He quit smokeless tobacco use about 20 years ago.  His smokeless tobacco use included chew. He reports that he drinks alcohol. He reports that he does not use drugs.   Family History:  The patient's family history includes Cancer in his mother; Cervical cancer in his mother; Dementia in his father; Heart disease in his sister; Heart failure in his father.  ROS:   ROS   PHYSICAL EXAM: *** VS:  There were no vitals taken for this visit. BMI: There is no height or weight on file to calculate BMI.  Physical Exam   EKG:  Was ordered and interpreted by me today.  Shows ***  Recent Labs: 05/03/2018: ALT 20; B Natriuretic Peptide 1,561.0; TSH 1.533 05/08/2018: Magnesium 1.9 06/09/2018: Hemoglobin 14.3; Platelets 133 06/20/2018: BUN 12; Creatinine, Ser 1.09; Potassium 4.5; Sodium 139  05/04/2018: Cholesterol 126; HDL 38; LDL Cholesterol 75; Total CHOL/HDL Ratio 3.3; Triglycerides 65; VLDL 13   CrCl cannot be calculated (Patient's most recent lab result is older than the maximum 21 days allowed.).   Wt Readings from Last 3 Encounters:  06/24/18 179 lb (81.2 kg)  06/11/18 180 lb (81.6 kg)  06/02/18 178 lb (80.7 kg)     Other studies reviewed: Additional studies/records reviewed today include: summarized above  ASSESSMENT AND PLAN:  1. ***  Disposition: F/u with Dr. Kirke Corin or APP in ***  Current medicines are reviewed at length with the patient today.  The patient did not have any concerns regarding medicines.  Signed, Eula Listen, PA-C 07/22/2018 9:13 AM     [facility] [place] [address] [place], [place] [address] [phone]

## 2018-07-23 ENCOUNTER — Encounter: Payer: Self-pay | Admitting: Physician Assistant

## 2018-08-06 ENCOUNTER — Ambulatory Visit (INDEPENDENT_AMBULATORY_CARE_PROVIDER_SITE_OTHER): Payer: Medicare Other | Admitting: Nurse Practitioner

## 2018-08-06 ENCOUNTER — Encounter: Payer: Self-pay | Admitting: Nurse Practitioner

## 2018-08-06 VITALS — BP 140/80 | Ht 69.0 in | Wt 191.5 lb

## 2018-08-06 DIAGNOSIS — I10 Essential (primary) hypertension: Secondary | ICD-10-CM

## 2018-08-06 DIAGNOSIS — I428 Other cardiomyopathies: Secondary | ICD-10-CM | POA: Diagnosis not present

## 2018-08-06 DIAGNOSIS — I5022 Chronic systolic (congestive) heart failure: Secondary | ICD-10-CM

## 2018-08-06 DIAGNOSIS — I480 Paroxysmal atrial fibrillation: Secondary | ICD-10-CM

## 2018-08-06 MED ORDER — LOSARTAN POTASSIUM 100 MG PO TABS: 100.0000 mg | ORAL_TABLET | Freq: Every day | ORAL | 1 refills | Status: DC

## 2018-08-06 MED ORDER — APIXABAN 5 MG PO TABS: 5.0000 mg | ORAL_TABLET | Freq: Two times a day (BID) | ORAL | 1 refills | Status: DC

## 2018-08-06 MED ORDER — METOPROLOL SUCCINATE ER 25 MG PO TB24: 25.0000 mg | ORAL_TABLET | Freq: Every day | ORAL | 1 refills | Status: DC

## 2018-08-06 NOTE — Progress Notes (Signed)
Office Visit    Patient Name: Donald Molina Date of Encounter: 08/06/2018  Primary Care Provider:  Patient, No Pcp Per Primary Cardiologist:  Lorine Bears, MD  Chief Complaint    67 year old male with a history of hypertension, atrial fibrillation, who presents for follow-up related to HFrEF and nonischemic cardiomyopathy.  Past Medical History    Past Medical History:  Diagnosis Date  . HFrEF (heart failure with reduced ejection fraction) (HCC)    a. 04/2018 Echo: EF 25-30%; b. 06/2018 Echo: EF 45-50%. diff HK, Gr1 DD. Nl RV fxn.  . Hypertension   . NICM (nonischemic cardiomyopathy) (HCC)    a. 04/2018 Echo: EF 25-30%, diff HK, mildly dil Ao root, mod MR, mod dil LA, mildly to mod reduced RV fxn, mildly dil RA, PASP ; b. 04/2018 Cath: Nl cors. CO 2.98/CI 1.47; c. 06/2018 Echo: EF 45-50%. diff HK, Gr1 DD. Nl RV fxn.  . Persistent atrial fibrillation (HCC)    a. Dx 04/2018 s/p DCCV and amio loading. CHA2DS2VASc 3-->eliquis.   Past Surgical History:  Procedure Laterality Date  . CARDIAC CATHETERIZATION    . RIGHT/LEFT HEART CATH AND CORONARY ANGIOGRAPHY N/A 05/05/2018   Procedure: RIGHT/LEFT HEART CATH AND CORONARY ANGIOGRAPHY;  Surgeon: Iran Ouch, MD;  Location: [facility] INVASIVE CV LAB;  Service: Cardiovascular;  Laterality: N/A;  . TEE WITHOUT CARDIOVERSION N/A 05/07/2018   Procedure: TRANSESOPHAGEAL ECHOCARDIOGRAM (TEE);  Surgeon: Yvonne Kendall, MD;  Location: [facility] ORS;  Service: Cardiovascular;  Laterality: N/A;    Allergies  No Known Allergies  History of Present Illness    67 year old male with the above complex past medical history including hypertension, atrial fibrillation, HFrEF, and nonischemic cardiomyopathy.  He was admitted to [facility] in early June 2019 with a several week history of dyspnea was found to be in atrial fibrillation.  He was markedly volume overloaded.  Echo showed an EF of 25 to 30%.  Catheterization revealed normal coronary  arteries.  Following adequate diuresis and amiodarone therapy, he underwent cardioversion which was successful.  When he initially followed up in late June, he was back in atrial fibrillation and had not been taking several of his medications including amiodarone.  Medications were resumed and when he was seen in heart failure clinic in early July, he was back in sinus rhythm.  He was last seen here on July 8 and since then notes that he is continued to do well.  Echocardiogram in August showed improvement in LV function with an EF 45 to 50% and diffuse hypokinesis.  He has not had any recurrent palpitations and denies chest pain, dyspnea, PND, orthopnea, dizziness, syncope, edema, or early satiety.  He reports compliance with medications.  Home Medications    Prior to Admission medications   Medication Sig Start Date End Date Taking? Authorizing Provider  amiodarone (PACERONE) 200 MG tablet Take 1 tablet (200 mg total) by mouth daily. 06/02/18  Yes Creig Hines, NP  apixaban (ELIQUIS) 5 MG TABS tablet Take 1 tablet (5 mg total) by mouth 2 (two) times daily. 05/08/18  Yes Delfino Lovett, MD  furosemide (LASIX) 40 MG tablet Take 1 tablet (40 mg total) by mouth daily. 06/16/18  Yes Gollan, Tollie Pizza, MD  losartan (COZAAR) 50 MG tablet Take 1 tablet (50 mg total) by mouth daily. 06/10/18 09/08/18 Yes Creig Hines, NP    Review of Systems    He denies chest pain, palpitations, dyspnea, pnd, orthopnea, n, v, dizziness, syncope, edema, weight gain, or  early satiety.  All other systems reviewed and are otherwise negative except as noted above.  Physical Exam    VS:  BP 140/80 (BP Location: Left Arm, Patient Position: Sitting, Cuff Size: Normal)   Ht 5\' 9"  (1.753 m)   Wt 191 lb 8 oz (86.9 kg)   BMI 28.28 kg/m  , BMI Body mass index is 28.28 kg/m. GEN: Well nourished, well developed, in no acute distress. HEENT: normal. Neck: Supple, no JVD, carotid bruits, or masses. Cardiac:  RRR, no murmurs, rubs, or gallops. No clubbing, cyanosis, edema.  Radials/DP/PT 2+ and equal bilaterally.  Respiratory:  Respirations regular and unlabored, clear to auscultation bilaterally. GI: Soft, nontender, nondistended, BS + x 4. MS: no deformity or atrophy. Skin: warm and dry, no rash. Neuro:  Strength and sensation are intact. Psych: Normal affect.  Accessory Clinical Findings    ECG personally reviewed by me today -regular sinus rhythm, 62, left axis deviation, anterolateral T wave inversion.  QTc 483.- no acute changes.  Assessment & Plan    1.  HFrEF/nonischemic cardiomyopathy: In the setting of rapid atrial fibrillation.  Repeat echo in August showed improvement in LV function to 45-50%.  He is euvolemic on exam and doing well without dyspnea or edema.  His weight is listed as 191.8 today however he says he has been weighing 182 pounds on his home scale.  He remains on beta-blocker and ARB therapy.  His pressure is elevated today we have increased his losartan to 100 mg daily.  Continue current dose of Lasix.  Plan to follow-up renal function and electro lites in a week.  2.  Paroxysmal atrial fibrillation: Maintaining sinus rhythm.  QTc stable.  He has not been having any palpitations.  Continue amiodarone and Eliquis.  CBC was stable in mid July.  In the setting of ongoing amiodarone therapy, follow-up LFTs and TSH when he comes for blood work next week.  We will also arrange for follow-up PFTs.  3.  Essential hypertension: Blood pressure moderately elevated.  Will increase losartan 100 mm daily.  4.  Prolonged QT: Stable on a lower dose of amiodarone.  5.  Disposition: Follow-up labs and PFTs as outlined above.  Follow-up in clinic in 3 months or sooner if necessary.   Nicolasa Ducking, NP 08/06/2018, 12:28 PM

## 2018-08-06 NOTE — Patient Instructions (Signed)
Medication Instructions: INCREASE the Losartan to 100 mg daily  If you need a refill on your cardiac medications before your next appointment, please call your pharmacy.   Labwork: Your provider would like for you to return in one week to have the following labs drawn: CMET and TSH. Please go to the [facility] entrance and check in at the front desk. You do not need an appointment.    Procedures/Testing: Your physician has recommended that you have a pulmonary function test. Pulmonary Function Tests are a group of tests that measure how well air moves in and out of your lungs. This has been scheduled for 08/21/18 at 1:30. Please arrive at the [facility] at 1:15.  Follow-Up: Your physician wants you to follow-up in 3 months with Dr. Kirke Corin.   Thank you for choosing Heartcare at [facility]!

## 2018-08-21 ENCOUNTER — Ambulatory Visit: Payer: Medicare Other | Attending: Nurse Practitioner

## 2018-08-21 DIAGNOSIS — I5022 Chronic systolic (congestive) heart failure: Secondary | ICD-10-CM

## 2018-08-21 DIAGNOSIS — I480 Paroxysmal atrial fibrillation: Secondary | ICD-10-CM | POA: Insufficient documentation

## 2018-08-21 DIAGNOSIS — I110 Hypertensive heart disease with heart failure: Secondary | ICD-10-CM | POA: Diagnosis not present

## 2018-08-21 DIAGNOSIS — I10 Essential (primary) hypertension: Secondary | ICD-10-CM

## 2018-08-21 MED ORDER — ALBUTEROL SULFATE (2.5 MG/3ML) 0.083% IN NEBU
2.5000 mg | INHALATION_SOLUTION | Freq: Once | RESPIRATORY_TRACT | Status: AC
  Administered 2018-08-21: 2.5 mg via RESPIRATORY_TRACT
  Filled 2018-08-21: qty 3

## 2018-09-07 ENCOUNTER — Other Ambulatory Visit: Payer: Self-pay | Admitting: Cardiovascular Disease

## 2018-11-07 ENCOUNTER — Ambulatory Visit: Payer: Medicare Other | Admitting: Cardiovascular Disease

## 2018-11-10 ENCOUNTER — Encounter: Payer: Self-pay | Admitting: Cardiovascular Disease

## 2018-11-25 DIAGNOSIS — I429 Cardiomyopathy, unspecified: Secondary | ICD-10-CM | POA: Diagnosis not present

## 2018-11-25 DIAGNOSIS — E8809 Other disorders of plasma-protein metabolism, not elsewhere classified: Secondary | ICD-10-CM | POA: Diagnosis not present

## 2018-11-25 DIAGNOSIS — I480 Paroxysmal atrial fibrillation: Secondary | ICD-10-CM | POA: Diagnosis not present

## 2018-11-25 DIAGNOSIS — Z1211 Encounter for screening for malignant neoplasm of colon: Secondary | ICD-10-CM | POA: Diagnosis not present

## 2018-11-25 DIAGNOSIS — Z79899 Other long term (current) drug therapy: Secondary | ICD-10-CM | POA: Diagnosis not present

## 2018-11-25 DIAGNOSIS — Z23 Encounter for immunization: Secondary | ICD-10-CM | POA: Diagnosis not present

## 2018-11-25 DIAGNOSIS — I10 Essential (primary) hypertension: Secondary | ICD-10-CM | POA: Diagnosis not present

## 2018-12-11 DIAGNOSIS — Z79899 Other long term (current) drug therapy: Secondary | ICD-10-CM | POA: Diagnosis not present

## 2019-07-03 ENCOUNTER — Inpatient Hospital Stay
Admit: 2019-07-03 | Discharge: 2019-07-03 | Disposition: A | Discharge status: Home / Self Care | Payer: Medicare Other | Attending: Nurse Practitioner | Admitting: Nurse Practitioner

## 2019-07-03 ENCOUNTER — Encounter: Payer: Self-pay | Admitting: Emergency Medicine

## 2019-07-03 ENCOUNTER — Other Ambulatory Visit: Payer: Self-pay

## 2019-07-03 ENCOUNTER — Inpatient Hospital Stay
Admission: EM | Admit: 2019-07-03 | Discharge: 2019-07-06 | DRG: 292 | Disposition: A | Discharge status: Home / Self Care | Payer: Medicare Other | Attending: Internal Medicine | Admitting: Internal Medicine

## 2019-07-03 ENCOUNTER — Emergency Department: Payer: Medicare Other

## 2019-07-03 DIAGNOSIS — I5043 Acute on chronic combined systolic (congestive) and diastolic (congestive) heart failure: Secondary | ICD-10-CM | POA: Diagnosis present

## 2019-07-03 DIAGNOSIS — I480 Paroxysmal atrial fibrillation: Secondary | ICD-10-CM | POA: Diagnosis not present

## 2019-07-03 DIAGNOSIS — I428 Other cardiomyopathies: Secondary | ICD-10-CM | POA: Diagnosis present

## 2019-07-03 DIAGNOSIS — I509 Heart failure, unspecified: Secondary | ICD-10-CM

## 2019-07-03 DIAGNOSIS — I4891 Unspecified atrial fibrillation: Secondary | ICD-10-CM

## 2019-07-03 DIAGNOSIS — Z7901 Long term (current) use of anticoagulants: Secondary | ICD-10-CM

## 2019-07-03 DIAGNOSIS — Z8049 Family history of malignant neoplasm of other genital organs: Secondary | ICD-10-CM

## 2019-07-03 DIAGNOSIS — I110 Hypertensive heart disease with heart failure: Secondary | ICD-10-CM | POA: Diagnosis not present

## 2019-07-03 DIAGNOSIS — Z87891 Personal history of nicotine dependence: Secondary | ICD-10-CM | POA: Diagnosis not present

## 2019-07-03 DIAGNOSIS — R05 Cough: Secondary | ICD-10-CM | POA: Diagnosis present

## 2019-07-03 DIAGNOSIS — I10 Essential (primary) hypertension: Secondary | ICD-10-CM | POA: Diagnosis not present

## 2019-07-03 DIAGNOSIS — Z79899 Other long term (current) drug therapy: Secondary | ICD-10-CM

## 2019-07-03 DIAGNOSIS — I5033 Acute on chronic diastolic (congestive) heart failure: Secondary | ICD-10-CM | POA: Diagnosis not present

## 2019-07-03 DIAGNOSIS — R0602 Shortness of breath: Secondary | ICD-10-CM | POA: Diagnosis not present

## 2019-07-03 DIAGNOSIS — I5023 Acute on chronic systolic (congestive) heart failure: Secondary | ICD-10-CM | POA: Diagnosis present

## 2019-07-03 DIAGNOSIS — Z299 Encounter for prophylactic measures, unspecified: Secondary | ICD-10-CM | POA: Diagnosis not present

## 2019-07-03 DIAGNOSIS — Z8249 Family history of ischemic heart disease and other diseases of the circulatory system: Secondary | ICD-10-CM | POA: Diagnosis not present

## 2019-07-03 DIAGNOSIS — R509 Fever, unspecified: Secondary | ICD-10-CM | POA: Diagnosis present

## 2019-07-03 DIAGNOSIS — Z23 Encounter for immunization: Secondary | ICD-10-CM

## 2019-07-03 DIAGNOSIS — Z20828 Contact with and (suspected) exposure to other viral communicable diseases: Secondary | ICD-10-CM | POA: Diagnosis present

## 2019-07-03 DIAGNOSIS — I4819 Other persistent atrial fibrillation: Secondary | ICD-10-CM | POA: Diagnosis present

## 2019-07-03 DIAGNOSIS — I5031 Acute diastolic (congestive) heart failure: Secondary | ICD-10-CM | POA: Diagnosis not present

## 2019-07-03 LAB — CBC WITH DIFFERENTIAL/PLATELET
Abs Immature Granulocytes: 0.01 10*3/uL (ref 0.00–0.07)
Basophils Absolute: 0.1 10*3/uL (ref 0.0–0.1)
Basophils Relative: 1 %
Eosinophils Absolute: 0.1 10*3/uL (ref 0.0–0.5)
Eosinophils Relative: 1 %
HCT: 37.2 % — ABNORMAL LOW (ref 39.0–52.0)
Hemoglobin: 13.6 g/dL (ref 13.0–17.0)
Immature Granulocytes: 0 %
Lymphocytes Relative: 18 %
Lymphs Abs: 1.3 10*3/uL (ref 0.7–4.0)
MCH: 32.1 pg (ref 26.0–34.0)
MCHC: 36.6 g/dL — ABNORMAL HIGH (ref 30.0–36.0)
MCV: 87.7 fL (ref 80.0–100.0)
Monocytes Absolute: 0.6 10*3/uL (ref 0.1–1.0)
Monocytes Relative: 8 %
Neutro Abs: 5.5 10*3/uL (ref 1.7–7.7)
Neutrophils Relative %: 72 %
Platelets: 229 10*3/uL (ref 150–400)
RBC: 4.24 MIL/uL (ref 4.22–5.81)
RDW: 12 % (ref 11.5–15.5)
WBC: 7.5 10*3/uL (ref 4.0–10.5)
nRBC: 0 % (ref 0.0–0.2)

## 2019-07-03 LAB — SARS CORONAVIRUS 2 BY RT PCR (HOSPITAL ORDER, PERFORMED IN ~~LOC~~ HOSPITAL LAB): SARS Coronavirus 2: NEGATIVE

## 2019-07-03 LAB — COMPREHENSIVE METABOLIC PANEL
ALT: 16 U/L (ref 0–44)
AST: 33 U/L (ref 15–41)
Albumin: 4.5 g/dL (ref 3.5–5.0)
Alkaline Phosphatase: 52 U/L (ref 38–126)
Anion gap: 9 (ref 5–15)
BUN: 12 mg/dL (ref 8–23)
CO2: 25 mmol/L (ref 22–32)
Calcium: 9.2 mg/dL (ref 8.9–10.3)
Chloride: 103 mmol/L (ref 98–111)
Creatinine, Ser: 0.89 mg/dL (ref 0.61–1.24)
GFR calc Af Amer: >60 mL/min (ref >60)
GFR calc non Af Amer: >60 mL/min (ref >60)
Glucose, Bld: 102 mg/dL — ABNORMAL HIGH (ref 70–99)
Potassium: 4.3 mmol/L (ref 3.5–5.1)
Sodium: 137 mmol/L (ref 135–145)
Total Bilirubin: 1.4 mg/dL — ABNORMAL HIGH (ref 0.3–1.2)
Total Protein: 8.4 g/dL — ABNORMAL HIGH (ref 6.5–8.1)

## 2019-07-03 LAB — BRAIN NATRIURETIC PEPTIDE: B Natriuretic Peptide: 1701 pg/mL — ABNORMAL HIGH (ref 0.0–100.0)

## 2019-07-03 LAB — TROPONIN I (HIGH SENSITIVITY)
Troponin I (High Sensitivity): 18 ng/L — ABNORMAL HIGH (ref <18)
Troponin I (High Sensitivity): 17 ng/L (ref <18)

## 2019-07-03 MED ORDER — APIXABAN 5 MG PO TABS
5.0000 mg | ORAL_TABLET | Freq: Two times a day (BID) | ORAL | Status: DC
  Administered 2019-07-03 – 2019-07-06 (×6): 5 mg via ORAL
  Filled 2019-07-03 (×6): qty 1

## 2019-07-03 MED ORDER — SODIUM CHLORIDE 0.9 % IV SOLN: 250.0000 mL | INTRAVENOUS | Status: DC | PRN

## 2019-07-03 MED ORDER — AMIODARONE HCL 200 MG PO TABS: 200.0000 mg | ORAL_TABLET | Freq: Every day | ORAL | Status: DC

## 2019-07-03 MED ORDER — ACETAMINOPHEN 325 MG PO TABS: 650.0000 mg | ORAL_TABLET | ORAL | Status: DC | PRN

## 2019-07-03 MED ORDER — FUROSEMIDE 10 MG/ML IJ SOLN
40.0000 mg | Freq: Two times a day (BID) | INTRAMUSCULAR | Status: DC
  Administered 2019-07-03 – 2019-07-04 (×4): 40 mg via INTRAVENOUS
  Filled 2019-07-03 (×4): qty 4

## 2019-07-03 MED ORDER — METOPROLOL TARTRATE 5 MG/5ML IV SOLN
5.0000 mg | Freq: Once | INTRAVENOUS | Status: AC
  Administered 2019-07-03: 12:00:00 5 mg via INTRAVENOUS
  Filled 2019-07-03: qty 5

## 2019-07-03 MED ORDER — AMIODARONE HCL 200 MG PO TABS
200.0000 mg | ORAL_TABLET | Freq: Every day | ORAL | Status: DC
  Administered 2019-07-03 – 2019-07-06 (×3): 200 mg via ORAL
  Filled 2019-07-03 (×3): qty 1

## 2019-07-03 MED ORDER — PNEUMOCOCCAL VAC POLYVALENT 25 MCG/0.5ML IJ INJ
0.5000 mL | INJECTION | INTRAMUSCULAR | Status: AC
  Administered 2019-07-04: 0.5 mL via INTRAMUSCULAR
  Filled 2019-07-03: qty 0.5

## 2019-07-03 MED ORDER — SODIUM CHLORIDE 0.9% FLUSH: 3.0000 mL | INTRAVENOUS | Status: DC | PRN

## 2019-07-03 MED ORDER — SODIUM CHLORIDE 0.9% FLUSH
3.0000 mL | Freq: Two times a day (BID) | INTRAVENOUS | Status: DC
  Administered 2019-07-03 – 2019-07-06 (×7): 3 mL via INTRAVENOUS

## 2019-07-03 MED ORDER — LOSARTAN POTASSIUM 50 MG PO TABS
75.0000 mg | ORAL_TABLET | Freq: Every day | ORAL | Status: DC
  Administered 2019-07-04 – 2019-07-06 (×2): 75 mg via ORAL
  Filled 2019-07-03 (×2): qty 1

## 2019-07-03 MED ORDER — METOPROLOL TARTRATE 5 MG/5ML IV SOLN
5.0000 mg | Freq: Once | INTRAVENOUS | Status: AC
  Administered 2019-07-03: 5 mg via INTRAVENOUS
  Filled 2019-07-03: qty 5

## 2019-07-03 MED ORDER — ONDANSETRON HCL 4 MG/2ML IJ SOLN: 4.0000 mg | Freq: Four times a day (QID) | INTRAMUSCULAR | Status: DC | PRN

## 2019-07-03 MED ORDER — FUROSEMIDE 10 MG/ML IJ SOLN
40.0000 mg | Freq: Once | INTRAMUSCULAR | Status: AC
  Administered 2019-07-03: 40 mg via INTRAVENOUS
  Filled 2019-07-03: qty 4

## 2019-07-03 NOTE — ED Notes (Signed)
ED TO INPATIENT HANDOFF REPORT  ED Nurse Name and Phone #: Eudora Guevarra [phone]  S Name/Age/Gender Donald BlalockNathaniel Molina 68 y.o. male Room/Bed: ED14A/ED14A  Code Status   Code Status: Full Code  Home/SNF/Other Home Patient oriented to: self, place, time and situation Is this baseline? Yes   Triage Complete: Triage complete  Chief Complaint Shortness of Breath/Edema   Triage Note Pt c/o SOB and increased edema to bila lower extremities. PT hx of CHF. Denies CP, cough or fever   Allergies No Known Allergies  Level of Care/Admitting Diagnosis ED Disposition    ED Disposition Condition Comment   Admit  Hospital Area: [facility] [[id number]]  Level of Care: Telemetry [5]  Covid Evaluation: Person Under Investigation (PUI)  Diagnosis: Acute on chronic diastolic CHF (congestive heart failure) [facility](HCC) [[id number]]) [[id number]]  Admitting Physician: Cristie HemUMA, ELIZABETH ACHIENG [[id number]]  Attending Physician: Webb SilversmithUMA, ELIZABETH ACHIENG [phone][[id number]]  Estimated length of stay: past midnight tomorrow  Certification:: I certify this patient will need inpatient services for at least 2 midnights  PT Class (Do Not Modify): Inpatient [101]  PT Acc Code (Do Not Modify): Private [1]       B Medical/Surgery History Past Medical History:  Diagnosis Date  . HFrEF (heart failure with reduced ejection fraction) (HCC)    a. 04/2018 Echo: EF 25-30%; b. 06/2018 Echo: EF 45-50%. diff HK, Gr1 DD. Nl RV fxn.  . Hypertension   . NICM (nonischemic cardiomyopathy) (HCC)    a. 04/2018 Echo: EF 25-30%, diff HK, mildly dil Ao root, mod MR, mod dil LA, mildly to mod reduced RV fxn, mildly dil RA, PASP 52mmHg; b. 04/2018 Cath: Nl cors. CO 2.98/CI 1.47; c. 06/2018 Echo: EF 45-50%. diff HK, Gr1 DD. Nl RV fxn.  . Persistent atrial fibrillation    a. Dx 04/2018 s/p DCCV and amio loading. CHA2DS2VASc 3-->eliquis.   Past Surgical History:  Procedure Laterality Date  . CARDIAC CATHETERIZATION    . RIGHT/LEFT HEART CATH AND CORONARY  ANGIOGRAPHY N/A 05/05/2018   Procedure: RIGHT/LEFT HEART CATH AND CORONARY ANGIOGRAPHY;  Surgeon: Iran OuchArida, Muhammad A, MD;  Location: [facility] INVASIVE CV LAB;  Service: Cardiovascular;  Laterality: N/A;  . TEE WITHOUT CARDIOVERSION N/A 05/07/2018   Procedure: TRANSESOPHAGEAL ECHOCARDIOGRAM (TEE);  Surgeon: Yvonne KendallEnd, Christopher, MD;  Location: [facility] ORS;  Service: Cardiovascular;  Laterality: N/A;     A IV Location/Drains/Wounds Patient Lines/Drains/Airways Status   Active Line/Drains/Airways    Name:   Placement date:   Placement time:   Site:   Days:   Peripheral IV 05/07/18 Left Hand   05/07/18    1500    Hand   422          Intake/Output Last 24 hours  Intake/Output Summary (Last 24 hours) at 07/03/2019 1323 Last data filed at 07/03/2019 1112 Gross per 24 hour  Intake -  Output 300 ml  Net -300 ml    Labs/Imaging Results for orders placed or performed during the hospital encounter of 07/03/19 (from the past 48 hour(s))  CBC with Differential     Status: Abnormal   Collection Time: 07/03/19 10:25 AM  Result Value Ref Range   WBC 7.5 4.0 - 10.5 K/uL   RBC 4.24 4.22 - 5.81 MIL/uL   Hemoglobin 13.6 13.0 - 17.0 g/dL   HCT 14.737.2 (L) 82.939.0 - 56.252.0 %   MCV 87.7 80.0 - 100.0 fL   MCH 32.1 26.0 - 34.0 pg   MCHC 36.6 (H) 30.0 - 36.0 g/dL   RDW 13.012.0 86.511.5 -  15.5 %   Platelets 229 150 - 400 K/uL   nRBC 0.0 0.0 - 0.2 %   Neutrophils Relative % 72 %   Neutro Abs 5.5 1.7 - 7.7 K/uL   Lymphocytes Relative 18 %   Lymphs Abs 1.3 0.7 - 4.0 K/uL   Monocytes Relative 8 %   Monocytes Absolute 0.6 0.1 - 1.0 K/uL   Eosinophils Relative 1 %   Eosinophils Absolute 0.1 0.0 - 0.5 K/uL   Basophils Relative 1 %   Basophils Absolute 0.1 0.0 - 0.1 K/uL   Immature Granulocytes 0 %   Abs Immature Granulocytes 0.01 0.00 - 0.07 K/uL    Comment: Performed at [facility], [address] [place], [place] [address]  Comprehensive metabolic panel     Status: Abnormal   Collection Time: 07/03/19 10:25 AM   Result Value Ref Range   Sodium 137 135 - 145 mmol/L   Potassium 4.3 3.5 - 5.1 mmol/L   Chloride 103 98 - 111 mmol/L   CO2 25 22 - 32 mmol/L   Glucose, Bld 102 (H) 70 - 99 mg/dL   BUN 12 8 - 23 mg/dL   Creatinine, Ser 0.89 0.61 - 1.24 mg/dL   Calcium 9.2 8.9 - 10.3 mg/dL   Total Protein 8.4 (H) 6.5 - 8.1 g/dL   Albumin 4.5 3.5 - 5.0 g/dL   AST 33 15 - 41 U/L   ALT 16 0 - 44 U/L   Alkaline Phosphatase 52 38 - 126 U/L   Total Bilirubin 1.4 (H) 0.3 - 1.2 mg/dL   GFR calc non Af Amer >60 >60 mL/min   GFR calc Af Amer >60 >60 mL/min   Anion gap 9 5 - 15    Comment: Performed at [facility], [address] [place], [place] [address]  Brain natriuretic peptide     Status: Abnormal   Collection Time: 07/03/19 10:25 AM  Result Value Ref Range   B Natriuretic Peptide 1,701.0 (H) 0.0 - 100.0 pg/mL    Comment: Performed at [facility], [place], [place] [address]  Troponin I (High Sensitivity)     Status: Abnormal   Collection Time: 07/03/19 10:25 AM  Result Value Ref Range   Troponin I (High Sensitivity) 18 (H) <18 ng/L    Comment: (NOTE) Elevated high sensitivity troponin I (hsTnI) values and significant  changes across serial measurements may suggest ACS but many other  chronic and acute conditions are known to elevate hsTnI results.  Refer to the "Links" section for chest pain algorithms and additional  guidance. Performed at [facility], [place], [place] [address]   Troponin I (High Sensitivity)     Status: None   Collection Time: 07/03/19 12:45 PM  Result Value Ref Range   Troponin I (High Sensitivity) 17 <18 ng/L    Comment: (NOTE) Elevated high sensitivity troponin I (hsTnI) values and significant  changes across serial measurements may suggest ACS but many other  chronic and acute conditions are known to elevate hsTnI results.  Refer to the "Links" section for chest pain algorithms and additional   guidance. Performed at [facility], [address] [place], [place] [address]    Dg Chest 2 View  Result Date: 07/03/2019 CLINICAL DATA:  Shortness of breath EXAM: CHEST - 2 VIEW COMPARISON:  05/03/2018 FINDINGS: Cardiomediastinal contours are stable. Mild bilateral interstitial opacities, similar to prior. Trace bilateral pleural effusions. No pneumothorax. IMPRESSION: Findings suggestive of mild  CHF with possible early interstitial edema. Electronically Signed   By: Duanne Guess M.D.   On: 07/03/2019 11:24    Pending Labs Unresulted Labs (From admission, onward)    Start     Ordered   07/04/19 0500  Basic metabolic panel  Daily,   STAT     07/03/19 1322   07/03/19 1314  HIV antibody (Routine Testing)  Once,   STAT     07/03/19 1322   07/03/19 1212  SARS Coronavirus 2 [facility] order, Performed in [facility] hospital lab) Nasopharyngeal Nasopharyngeal Swab  (Symptomatic/High Risk of Exposure/Tier 1 Patients Labs with Precautions)  Once,   STAT    Question Answer Comment  Is this test for diagnosis or screening Diagnosis of ill patient   Symptomatic for COVID-19 as defined by CDC Yes   Date of Symptom Onset 07/03/2019   Hospitalized for COVID-19 Yes   Admitted to ICU for COVID-19 No   Previously tested for COVID-19 Yes   Resident in a congregate (group) care setting No   Employed in healthcare setting No      07/03/19 1211          Vitals/Pain Today's Vitals   07/03/19 0942 07/03/19 0944 07/03/19 0945 07/03/19 1213  BP: 133/89 133/89  108/61  Pulse: (!) 56 71  100  Resp: 18 20  (!) 30  Temp: 99.3 F (37.4 C) 99.3 F (37.4 C)    TempSrc: Oral Oral    SpO2: 100% 100%  100%  Weight:   79.4 kg   Height:   5\' 7"  (1.702 m)   PainSc:   0-No pain     Isolation Precautions Airborne and Contact precautions  Medications Medications  amiodarone (PACERONE) tablet 200 mg (has no administration in time range)  losartan (COZAAR) tablet 75 mg (has no  administration in time range)  apixaban (ELIQUIS) tablet 5 mg (has no administration in time range)  sodium chloride flush (NS) 0.9 % injection 3 mL (has no administration in time range)  sodium chloride flush (NS) 0.9 % injection 3 mL (has no administration in time range)  0.9 %  sodium chloride infusion (has no administration in time range)  acetaminophen (TYLENOL) tablet 650 mg (has no administration in time range)  ondansetron (ZOFRAN) injection 4 mg (has no administration in time range)  furosemide (LASIX) injection 40 mg (has no administration in time range)  furosemide (LASIX) injection 40 mg (40 mg Intravenous Given 07/03/19 1047)  metoprolol tartrate (LOPRESSOR) injection 5 mg (5 mg Intravenous Given 07/03/19 1046)  metoprolol tartrate (LOPRESSOR) injection 5 mg (5 mg Intravenous Given 07/03/19 1202)    Mobility walks Low fall risk   Focused Assessments Cardiac Assessment Handoff:  Cardiac Rhythm: Atrial fibrillation Lab Results  Component Value Date   TROPONINI <0.03 05/03/2018   No results found for: DDIMER Does the Patient currently have chest pain? No     R Recommendations: See Admitting Provider Note  Report given to:   Additional Notes:

## 2019-07-03 NOTE — ED Notes (Signed)
Patient transported to X-ray 

## 2019-07-03 NOTE — ED Provider Notes (Signed)
[facility] Emergency Department Provider Note  ____________________________________________   First MD Initiated Contact with Patient 07/03/19 [phone]     (approximate)  I have reviewed the triage vital signs and the nursing notes.   HISTORY  Chief Complaint Shortness of Breath    HPI Donald Molina is a 68 y.o. male with history of nonischemic cardiomyopathy, A. fib on blood thinners here with shortness of breath.  Patient reports several days of progressive worsening bilateral lower extremity edema.  Of last 24 hours, he said worsening shortness of breath and difficulty sleeping due to this.  He has had mild, nonproductive cough.  He has had shortness of breath.  No chest pain.  States his symptoms are worse when lying flat as well as with exertion.  No alleviating factors.  He does not weigh himself daily.  Not sure what his blood pressure has been.        Past Medical History:  Diagnosis Date   HFrEF (heart failure with reduced ejection fraction) (HCC)    a. 04/2018 Echo: EF 25-30%; b. 06/2018 Echo: EF 45-50%. diff HK, Gr1 DD. Nl RV fxn.   Hypertension    NICM (nonischemic cardiomyopathy) (HCC)    a. 04/2018 Echo: EF 25-30%, diff HK, mildly dil Ao root, mod MR, mod dil LA, mildly to mod reduced RV fxn, mildly dil RA, PASP 52mmHg; b. 04/2018 Cath: Nl cors. CO 2.98/CI 1.47; c. 06/2018 Echo: EF 45-50%. diff HK, Gr1 DD. Nl RV fxn.   Persistent atrial fibrillation    a. Dx 04/2018 s/p DCCV and amio loading. CHA2DS2VASc 3-->eliquis.    Patient Active Problem List   Diagnosis Date Noted   Acute on chronic diastolic CHF (congestive heart failure) (HCC) 07/03/2019   HTN (hypertension) 05/29/2018   CHF (congestive heart failure) (HCC) 05/03/2018   Atrial fibrillation with RVR (HCC)    Acute pulmonary edema (HCC)     Past Surgical History:  Procedure Laterality Date   CARDIAC CATHETERIZATION     RIGHT/LEFT HEART CATH AND CORONARY ANGIOGRAPHY N/A  05/05/2018   Procedure: RIGHT/LEFT HEART CATH AND CORONARY ANGIOGRAPHY;  Surgeon: Iran OuchArida, Muhammad A, MD;  Location: [facility] INVASIVE CV LAB;  Service: Cardiovascular;  Laterality: N/A;   TEE WITHOUT CARDIOVERSION N/A 05/07/2018   Procedure: TRANSESOPHAGEAL ECHOCARDIOGRAM (TEE);  Surgeon: Yvonne KendallEnd, Christopher, MD;  Location: [facility] ORS;  Service: Cardiovascular;  Laterality: N/A;    Prior to Admission medications   Medication Sig Start Date End Date Taking? Authorizing Provider  amiodarone (PACERONE) 200 MG tablet Take 1 tablet (200 mg total) by mouth daily. 06/02/18  Yes Creig HinesBerge, Christopher Ronald, NP  apixaban (ELIQUIS) 5 MG TABS tablet Take 1 tablet (5 mg total) by mouth 2 (two) times daily. 08/06/18  Yes Creig HinesBerge, Christopher Ronald, NP  furosemide (LASIX) 40 MG tablet TAKE 1 TABLET BY MOUTH EVERY DAY 09/08/18  Yes Gollan, Tollie Pizzaimothy J, MD  losartan (COZAAR) 50 MG tablet Take 75 mg by mouth daily. Pt take both a 25 mg and a 50 mg losartan   Yes [provider]    Allergies Patient has no known allergies.  Family History  Problem Relation Age of Onset   Cancer Mother        pancreatic   Cervical cancer Mother    Dementia Father    Heart failure Father    Heart disease Sister     Social History Social History   Tobacco Use   Smoking status: Former Smoker    Years: 10.00  Quit date: 05/28/1998    Years since quitting: 21.1   Smokeless tobacco: Former Systems developer    Types: Chew    Quit date: 05/28/1998  Substance Use Topics   Alcohol use: Yes    Comment: occassional 1 beer   Drug use: Never    Review of Systems  Review of Systems  Constitutional: Positive for fatigue. Negative for chills and fever.  HENT: Negative for sore throat.   Respiratory: Positive for cough and shortness of breath.   Cardiovascular: Positive for leg swelling. Negative for chest pain.  Gastrointestinal: Negative for abdominal pain.  Genitourinary: Negative for flank pain.  Musculoskeletal: Negative for  neck pain.  Skin: Negative for rash and wound.  Allergic/Immunologic: Negative for immunocompromised state.  Neurological: Positive for weakness. Negative for numbness.  Hematological: Does not bruise/bleed easily.  All other systems reviewed and are negative.    ____________________________________________  PHYSICAL EXAM:      VITAL SIGNS: ED Triage Vitals  Enc Vitals Group     BP 07/03/19 0942 133/89     Pulse Rate 07/03/19 0942 (!) 56     Resp 07/03/19 0942 18     Temp 07/03/19 0942 99.3 F (37.4 C)     Temp Source 07/03/19 0942 Oral     SpO2 07/03/19 0942 100 %     Weight 07/03/19 0945 175 lb (79.4 kg)     Height 07/03/19 0945 5\' 7"  (1.702 m)     Head Circumference --      Peak Flow --      Pain Score 07/03/19 0945 0     Pain Loc --      Pain Edu? --      Excl. in [place]? --      Physical Exam Vitals signs and nursing note reviewed.  Constitutional:      General: He is not in acute distress.    Appearance: He is well-developed.  HENT:     Head: Normocephalic and atraumatic.  Eyes:     Conjunctiva/sclera: Conjunctivae normal.  Neck:     Musculoskeletal: Neck supple.  Cardiovascular:     Rate and Rhythm: Tachycardia present. Rhythm irregularly irregular.     Heart sounds: Normal heart sounds.  Pulmonary:     Effort: Pulmonary effort is normal. No respiratory distress.     Breath sounds: Examination of the right-lower field reveals rales. Examination of the left-lower field reveals rales. Rales present. No wheezing.  Chest:     Chest wall: Edema present.  Abdominal:     General: There is no distension.  Skin:    General: Skin is warm.     Capillary Refill: Capillary refill takes less than 2 seconds.     Findings: No rash.  Neurological:     Mental Status: He is alert and oriented to person, place, and time.     Motor: No abnormal muscle tone.       ____________________________________________   LABS (all labs ordered are listed, but only abnormal  results are displayed)  Labs Reviewed  CBC WITH DIFFERENTIAL/PLATELET - Abnormal; Notable for the following components:      Result Value   HCT 37.2 (*)    MCHC 36.6 (*)    All other components within normal limits  COMPREHENSIVE METABOLIC PANEL - Abnormal; Notable for the following components:   Glucose, Bld 102 (*)    Total Protein 8.4 (*)    Total Bilirubin 1.4 (*)    All other components within normal limits  BRAIN NATRIURETIC PEPTIDE - Abnormal; Notable for the following components:   B Natriuretic Peptide 1,701.0 (*)    All other components within normal limits  TROPONIN I (HIGH SENSITIVITY) - Abnormal; Notable for the following components:   Troponin I (High Sensitivity) 18 (*)    All other components within normal limits  SARS CORONAVIRUS 2 (HOSPITAL ORDER, PERFORMED IN [facility])  HIV ANTIBODY (ROUTINE TESTING W REFLEX)  TROPONIN I (HIGH SENSITIVITY)    ____________________________________________  EKG: Atrial fibrillation with rapid ventricular rate, nonspecific ST segment changes likely due to demand.  No overt ST elevations. ________________________________________  RADIOLOGY All imaging, including plain films, CT scans, and ultrasounds, independently reviewed by me, and interpretations confirmed via formal radiology reads.  ED MD interpretation:   CXR: Mild CHF  Official radiology report(s): Dg Chest 2 View  Result Date: 07/03/2019 CLINICAL DATA:  Shortness of breath EXAM: CHEST - 2 VIEW COMPARISON:  05/03/2018 FINDINGS: Cardiomediastinal contours are stable. Mild bilateral interstitial opacities, similar to prior. Trace bilateral pleural effusions. No pneumothorax. IMPRESSION: Findings suggestive of mild CHF with possible early interstitial edema. Electronically Signed   By: Duanne GuessNicholas  Plundo M.D.   On: 07/03/2019 11:24    ____________________________________________  PROCEDURES   Procedure(s) performed (including Critical Care):  .Critical  Care Performed by: Shaune PollackIsaacs, Vernal Rutan, MD Authorized by: Shaune PollackIsaacs, Paulita Licklider, MD   Critical care provider statement:    Critical care time (minutes):  35   Critical care time was exclusive of:  Separately billable procedures and treating other patients and teaching time   Critical care was time spent personally by me on the following activities:  Development of treatment plan with patient or surrogate, discussions with consultants, evaluation of patient's response to treatment, examination of patient, obtaining history from patient or surrogate, ordering and performing treatments and interventions, ordering and review of laboratory studies, ordering and review of radiographic studies, pulse oximetry, re-evaluation of patient's condition and review of old charts   I assumed direction of critical care for this patient from another provider in my specialty: no      ____________________________________________  INITIAL IMPRESSION / MDM / ASSESSMENT AND PLAN / ED COURSE  As part of my medical decision making, I reviewed the following data within the electronic MEDICAL RECORD NUMBER Notes from prior ED visits and [place] Controlled Substance Database      *Donald Blalockathaniel Camino was evaluated in Emergency Department on 07/03/2019 for the symptoms described in the history of present illness. He was evaluated in the context of the global COVID-19 pandemic, which necessitated consideration that the patient might be at risk for infection with the SARS-CoV-2 virus that causes COVID-19. Institutional protocols and algorithms that pertain to the evaluation of patients at risk for COVID-19 are in a state of rapid change based on information released by regulatory bodies including the CDC and federal and state organizations. These policies and algorithms were followed during the patient's care in the ED.  Some ED evaluations and interventions may be delayed as a result of limited staffing during the pandemic.*      Medical Decision  Making: 68 yo M here with acute on chronic CHF and AFib RVR. Improved with IV Lopressor, Lasix but remains tachypneic. No apparent infectious trigger. COVID neg. Trop neg, no signs of ischemia. Admit to medicine.  ____________________________________________  FINAL CLINICAL IMPRESSION(S) / ED DIAGNOSES  Final diagnoses:  Acute congestive heart failure, unspecified heart failure type (HCC)  Atrial fibrillation with rapid ventricular response (HCC)  MEDICATIONS GIVEN DURING THIS VISIT:  Medications  losartan (COZAAR) tablet 75 mg (has no administration in time range)  apixaban (ELIQUIS) tablet 5 mg (has no administration in time range)  sodium chloride flush (NS) 0.9 % injection 3 mL (has no administration in time range)  sodium chloride flush (NS) 0.9 % injection 3 mL (has no administration in time range)  0.9 %  sodium chloride infusion (has no administration in time range)  acetaminophen (TYLENOL) tablet 650 mg (has no administration in time range)  ondansetron (ZOFRAN) injection 4 mg (has no administration in time range)  furosemide (LASIX) injection 40 mg (has no administration in time range)  amiodarone (PACERONE) tablet 200 mg (has no administration in time range)  furosemide (LASIX) injection 40 mg (40 mg Intravenous Given 07/03/19 1047)  metoprolol tartrate (LOPRESSOR) injection 5 mg (5 mg Intravenous Given 07/03/19 1046)  metoprolol tartrate (LOPRESSOR) injection 5 mg (5 mg Intravenous Given 07/03/19 1202)     ED Discharge Orders    None       Note:  This document was prepared using Dragon voice recognition software and may include unintentional dictation errors.   Shaune Pollack, MD 07/03/19 [phone]

## 2019-07-03 NOTE — Progress Notes (Signed)
Family Meeting Note  Advance Directive:yes  Today a meeting took place with the Patient.  The following clinical team members were present during this meeting:MD  The following were discussed:Patient's diagnosis: CHF, A fib, Htn, Nonischemic cardiomyopathy , Patient's progosis: Unable to determine and Goals for treatment: Full Code  Additional follow-up to be provided: Cardiology  Time spent during discussion:20 minutes  Vaughan Basta, MD

## 2019-07-03 NOTE — Progress Notes (Signed)
Patient's heart rate sustaining in the 120s. Patient sitting up in the chair and currently asymptomatic. No complaints of chest pain or shortness of breath. Benjamine Mola, NP notified. Verbal orders to continue to monitor patient and notify of any more changes.

## 2019-07-03 NOTE — ED Triage Notes (Signed)
Pt c/o SOB and increased edema to bila lower extremities. PT hx of CHF. Denies CP, cough or fever

## 2019-07-03 NOTE — ED Notes (Signed)
Attempted to call report

## 2019-07-03 NOTE — Progress Notes (Signed)
*  PRELIMINARY RESULTS* Echocardiogram 2D Echocardiogram has been performed.  [place] 07/03/2019, 7:04 PM

## 2019-07-03 NOTE — Plan of Care (Signed)
  Problem: Education: Goal: Ability to demonstrate management of disease process will improve Outcome: Progressing   Problem: Activity: Goal: Capacity to carry out activities will improve Outcome: Progressing   Problem: Cardiac: Goal: Ability to achieve and maintain adequate cardiopulmonary perfusion will improve Outcome: Progressing   Problem: Clinical Measurements: Goal: Respiratory complications will improve Outcome: Progressing   Problem: Activity: Goal: Risk for activity intolerance will decrease Outcome: Progressing   Problem: Nutrition: Goal: Adequate nutrition will be maintained Outcome: Progressing

## 2019-07-03 NOTE — H&P (Addendum)
[facility] -  at [facility]   PATIENT NAME: Donald Blalockathaniel Leeds    MR#:  [id number]  DATE OF BIRTH:  04/16/1951  DATE OF ADMISSION:  07/03/2019  PRIMARY CARE PHYSICIAN: Patient, No Pcp Per   REQUESTING/REFERRING PHYSICIAN: Shaune PollackIsaacs, Cameron, MD  CHIEF COMPLAINT:   Chief Complaint  Patient presents with  . Shortness of Breath    HISTORY OF PRESENT ILLNESS:  68 year old male with a past medical history of CHF on Lasix, atrial fibrillation on Eliquis and amiodarone, and non-ischemic cardiomyopathy presenting to the ED with chief complaints of shortness of breath, bilateral lower extremity edema and cough x2 days.  Patient describes onset of symptoms since Wednesday and has been progressive without associated chest pain, nausea or vomiting, diarrhea, fevers or chills.  Patient states that he is not been able to sleep due to shortness of breath and persistent nonproductive cough.  Patient states that he ran out of 2 of his medication and has not been able to refill them.  On arrival to the ED, he was afebrile with blood pressure 224/114 mm Hg and pulse rate 81 beats/min. There were no focal neurological deficits; he was alert and oriented x4.  Initial labs revealed unremarkable CBC and CMP except for slightly elevated bilirubin of 1.2, BNP 1700 and troponin 18.  Chest x-ray showed trace pleural effusion.  Due to this finding patient will be admitted for further management under hospitalist service.  PAST MEDICAL HISTORY:   Past Medical History:  Diagnosis Date  . HFrEF (heart failure with reduced ejection fraction) (HCC)    a. 04/2018 Echo: EF 25-30%; b. 06/2018 Echo: EF 45-50%. diff HK, Gr1 DD. Nl RV fxn.  . Hypertension   . NICM (nonischemic cardiomyopathy) (HCC)    a. 04/2018 Echo: EF 25-30%, diff HK, mildly dil Ao root, mod MR, mod dil LA, mildly to mod reduced RV fxn, mildly dil RA, PASP 52mmHg; b. 04/2018 Cath: Nl cors. CO 2.98/CI 1.47; c. 06/2018 Echo: EF 45-50%.  diff HK, Gr1 DD. Nl RV fxn.  . Persistent atrial fibrillation    a. Dx 04/2018 s/p DCCV and amio loading. CHA2DS2VASc 3-->eliquis.    PAST SURGICAL HISTORY:   Past Surgical History:  Procedure Laterality Date  . CARDIAC CATHETERIZATION    . RIGHT/LEFT HEART CATH AND CORONARY ANGIOGRAPHY N/A 05/05/2018   Procedure: RIGHT/LEFT HEART CATH AND CORONARY ANGIOGRAPHY;  Surgeon: Iran OuchArida, Muhammad A, MD;  Location: [facility] INVASIVE CV LAB;  Service: Cardiovascular;  Laterality: N/A;  . TEE WITHOUT CARDIOVERSION N/A 05/07/2018   Procedure: TRANSESOPHAGEAL ECHOCARDIOGRAM (TEE);  Surgeon: Yvonne KendallEnd, Christopher, MD;  Location: [facility] ORS;  Service: Cardiovascular;  Laterality: N/A;    SOCIAL HISTORY:   Social History   Tobacco Use  . Smoking status: Former Smoker    Years: 10.00    Quit date: 05/28/1998    Years since quitting: 21.1  . Smokeless tobacco: Former NeurosurgeonUser    Types: Chew    Quit date: 05/28/1998  Substance Use Topics  . Alcohol use: Yes    Comment: occassional 1 beer    FAMILY HISTORY:   Family History  Problem Relation Age of Onset  . Cancer Mother        pancreatic  . Cervical cancer Mother   . Dementia Father   . Heart failure Father   . Heart disease Sister     DRUG ALLERGIES:  No Known Allergies  REVIEW OF SYSTEMS:   Review of Systems  Constitutional: Negative for chills, fever, malaise/fatigue  and weight loss.  HENT: Negative for congestion, hearing loss and sore throat.   Eyes: Negative for blurred vision and double vision.  Respiratory: Positive for cough and shortness of breath. Negative for wheezing.   Cardiovascular: Positive for leg swelling. Negative for chest pain, palpitations and orthopnea.  Gastrointestinal: Negative for abdominal pain, diarrhea, nausea and vomiting.  Genitourinary: Negative for dysuria and urgency.  Musculoskeletal: Negative for myalgias.  Skin: Negative for rash.  Neurological: Negative for dizziness, sensory change, speech change, focal  weakness and headaches.  Psychiatric/Behavioral: Negative for depression. The patient has insomnia.     MEDICATIONS AT HOME:   Prior to Admission medications   Medication Sig Start Date End Date Taking? Authorizing Provider  amiodarone (PACERONE) 200 MG tablet Take 1 tablet (200 mg total) by mouth daily. 06/02/18  Yes Creig Hines, NP  apixaban (ELIQUIS) 5 MG TABS tablet Take 1 tablet (5 mg total) by mouth 2 (two) times daily. 08/06/18  Yes Creig Hines, NP  furosemide (LASIX) 40 MG tablet TAKE 1 TABLET BY MOUTH EVERY DAY 09/08/18  Yes Gollan, Tollie Pizza, MD  losartan (COZAAR) 50 MG tablet Take 75 mg by mouth daily. Pt take both a 25 mg and a 50 mg losartan   Yes [provider]      VITAL SIGNS:  Blood pressure 125/75, pulse (!) 101, temperature 99.4 F (37.4 C), temperature source Oral, resp. rate 20, height 5\' 7"  (1.702 m), weight 80.8 kg, SpO2 98 %.  PHYSICAL EXAMINATION:   Physical Exam  GENERAL:  68 y.o.-year-old patient lying in the bed with no acute distress.  EYES: Pupils equal, round, reactive to light and accommodation. No scleral icterus. Extraocular muscles intact.  HEENT: Head atraumatic, normocephalic. Oropharynx and nasopharynx clear.  NECK:  Supple, no jugular venous distention. No thyroid enlargement, no tenderness.  LUNGS: Normal breath sounds bilaterally, no wheezing, rales,rhonchi or crepitation. No use of accessory muscles of respiration.  CARDIOVASCULAR: S1, S2 normal. No murmurs, rubs, or gallops.  ABDOMEN: Soft, nontender, nondistended. Bowel sounds present. No organomegaly or mass.  EXTREMITIES: Bilateral pitting edema of legs, cyanosis, or clubbing. No rash or lesions. + pedal pulses MUSCULOSKELETAL: Normal bulk, and power was 5+ grip and elbow, knee, and ankle flexion and extension bilaterally.  NEUROLOGIC:Alert and oriented x 3. CN 2-12 intact. Sensation to light touch and cold stimuli intact bilaterally. Babinski is  downgoing. DTR's (biceps, patellar, and achilles) 2+ and symmetric throughout. Gait not tested due to safety concern. PSYCHIATRIC: The patient is alert and oriented x 3.  SKIN: No obvious rash, lesion, or ulcer.   DATA REVIEWED:  LABORATORY PANEL:   CBC Recent Labs  Lab 07/03/19 1025  WBC 7.5  HGB 13.6  HCT 37.2*  PLT 229   ------------------------------------------------------------------------------------------------------------------  Chemistries  Recent Labs  Lab 07/03/19 1025  NA 137  K 4.3  CL 103  CO2 25  GLUCOSE 102*  BUN 12  CREATININE 0.89  CALCIUM 9.2  AST 33  ALT 16  ALKPHOS 52  BILITOT 1.4*   ------------------------------------------------------------------------------------------------------------------  Cardiac Enzymes No results for input(s): TROPONINI in the last 168 hours. ------------------------------------------------------------------------------------------------------------------  RADIOLOGY:  Dg Chest 2 View  Result Date: 07/03/2019 CLINICAL DATA:  Shortness of breath EXAM: CHEST - 2 VIEW COMPARISON:  05/03/2018 FINDINGS: Cardiomediastinal contours are stable. Mild bilateral interstitial opacities, similar to prior. Trace bilateral pleural effusions. No pneumothorax. IMPRESSION: Findings suggestive of mild CHF with possible early interstitial edema. Electronically Signed   By: Vernice Jefferson.D.  On: 07/03/2019 11:24    EKG:  EKG: unchanged from previous tracings, atrial fibrillation, rate RVR. Vent. rate 119 BPM PR interval * ms QRS duration 94 ms QT/QTc 324/455 ms P-R-T axes * -47 72 IMPRESSION AND PLAN:   68 y.o. male with a past medical history of CHF on Lasix, atrial fibrillation on Eliquis and amiodarone, and non-ischemic cardiomyopathy presenting to the ED with chief complaints of shortness of breath, bilateral lower extremity edema and cough x2 days  1. Acute on chronic Diastolic Congestive Heart Failure: Acute  presentation likely due to volume overload with associated symptoms of SOB, BLE edema. BNP elevated at 1701 -Admit to telemetry unit Non- Ischemic Cardiomyopathy.  Last Echo 06/2018 with  EF 45-50% - Afterload, Goal MAP <70: Continue losartan Lisinopril as BP allows - Diuretics: Furosemide 40mg  IV BID. Diureses >1L negative per day until approach euvolemia / worsening renal function. - Echocardiogram pending - Low salt diet  - Check daily weight - Strict I&Os - CHF Teaching - Cardiology consult  2.  Paroxysmal atrial fibrillation-noted to be in RVR status post 1 dose of Lopressor - Rate controlled on Amiodarone, missed doses x 2 ran out - Eliquis   3. HTN- stable + Goal BP <130/80 - Continue losartan  4.DVT prophylaxis - Therapeutically anti-coagulated with Eliquis   All the records are reviewed and case discussed with ED provider. Management plans discussed with the patient, family and they are in agreement.  CODE STATUS: FULL  TOTAL TIME TAKING CARE OF THIS PATIENT: 50 minutes.    on 07/03/2019 at 11:09 PM  Rufina Falco, DNP, FNP-BC [facility] Nurse Practitioner Between 7am to 6pm - Pager [phone]  After 6pm go to [email or web address] - password [email or web address] [facility]  Office  [phone]  CC: Primary care physician; Patient, No Pcp Per

## 2019-07-03 NOTE — Evaluation (Signed)
Physical Therapy Evaluation Patient Details Name: Donald Molina MRN: [id number] DOB: Apr 21, 1951 Today's Date: 07/03/2019   History of Present Illness  From MD note: Pt is a 68 y.o. male with history of nonischemic cardiomyopathy, A. fib on blood thinners here with shortness of breath.  Patient reports several days of progressive worsening bilateral lower extremity edema.  Of last 24 hours, he said worsening shortness of breath and difficulty sleeping due to this.  He has had mild, nonproductive cough.  He has had shortness of breath.  No chest pain.  States his symptoms are worse when lying flat as well as with exertion.  No alleviating factors.  He does not weigh himself daily.  Assessment included mild CHF.    Clinical Impression  Pt presents without deficits in strength, transfers, mobility, gait, or balance.  Pt was able to amb 200' without an AD independently including with head turns and start/stops.  Pt was able to perform SLS time >10 sec on each LE.  Pt's baseline vitals were: HR 96 bpm, SpO2 97%, and RR 20.  After amb: HR 118 bpm, SpO2 96%, and RR 26.  Pt reports feeling back to PLOF functionally and will not require skilled PT services at this time.  Will complete PT orders but will reassess pt pending a change in status upon receipt of new PT orders.       Follow Up Recommendations No PT follow up    Equipment Recommendations  None recommended by PT    Recommendations for Other Services       Precautions / Restrictions Precautions Precautions: None Restrictions Weight Bearing Restrictions: No      Mobility  Bed Mobility Overal bed mobility: Independent                Transfers Overall transfer level: Independent                  Ambulation/Gait Ambulation/Gait assistance: Independent Gait Distance (Feet): 200 Feet Assistive device: None Gait Pattern/deviations: WFL(Within Functional Limits) Gait velocity: WFL   General Gait Details: Pt steady with  amb including during dynamic gait with head turns and start/stops; HR and SpO2 WNL throughout with min-mod SOB after amb that resolved quickly upon sitting  Stairs            Wheelchair Mobility    Modified Rankin (Stroke Patients Only)       Balance Overall balance assessment: Independent(SLS time > 10 min on BLEs)                                           Pertinent Vitals/Pain Pain Assessment: No/denies pain    Home Living Family/patient expects to be discharged to:: Private residence Living Arrangements: Other relatives Available Help at Discharge: Friend(s);Available 24 hours/day Type of Home: House Home Access: Level entry     Home Layout: One level Home Equipment: None      Prior Function Level of Independence: Independent         Comments: Ind amb unlimmited community distances without an AD, no fall history, Ind with all ADLs, active outdoors including gardening     Hand Dominance        Extremity/Trunk Assessment   Upper Extremity Assessment Upper Extremity Assessment: Overall WFL for tasks assessed    Lower Extremity Assessment Lower Extremity Assessment: Overall WFL for tasks assessed  Communication   Communication: No difficulties  Cognition Arousal/Alertness: Awake/alert Behavior During Therapy: WFL for tasks assessed/performed Overall Cognitive Status: Within Functional Limits for tasks assessed                                        General Comments      Exercises     Assessment/Plan    PT Assessment Patent does not need any further PT services  PT Problem List         PT Treatment Interventions      PT Goals (Current goals can be found in the Care Plan section)  Acute Rehab PT Goals Patient Stated Goal: To get back home PT Goal Formulation: All assessment and education complete, DC therapy    Frequency     Barriers to discharge        Co-evaluation                AM-PAC PT "6 Clicks" Mobility  Outcome Measure Help needed turning from your back to your side while in a flat bed without using bedrails?: None Help needed moving from lying on your back to sitting on the side of a flat bed without using bedrails?: None Help needed moving to and from a bed to a chair (including a wheelchair)?: None Help needed standing up from a chair using your arms (e.g., wheelchair or bedside chair)?: None Help needed to walk in hospital room?: None Help needed climbing 3-5 steps with a railing? : None 6 Click Score: 24    End of Session Equipment Utilized During Treatment: Gait belt Activity Tolerance: Patient tolerated treatment well Patient left: in chair;with call bell/phone within reach Nurse Communication: Mobility status PT Visit Diagnosis: Difficulty in walking, not elsewhere classified (R26.2)    Time: 3545-6256 PT Time Calculation (min) (ACUTE ONLY): 18 min   Charges:   PT Evaluation $PT Eval Low Complexity: 1 Low          D. Elly Modena PT, DPT 07/03/19, 5:13 PM

## 2019-07-04 LAB — BASIC METABOLIC PANEL WITH GFR
Anion gap: 11 (ref 5–15)
BUN: 14 mg/dL (ref 8–23)
CO2: 29 mmol/L (ref 22–32)
Calcium: 9.1 mg/dL (ref 8.9–10.3)
Chloride: 101 mmol/L (ref 98–111)
Creatinine, Ser: 0.92 mg/dL (ref 0.61–1.24)
GFR calc Af Amer: >60 mL/min
GFR calc non Af Amer: >60 mL/min
Glucose, Bld: 108 mg/dL — ABNORMAL HIGH (ref 70–99)
Potassium: 3.6 mmol/L (ref 3.5–5.1)
Sodium: 141 mmol/L (ref 135–145)

## 2019-07-04 MED ORDER — METOPROLOL TARTRATE 5 MG/5ML IV SOLN
5.0000 mg | Freq: Four times a day (QID) | INTRAVENOUS | Status: DC | PRN
  Administered 2019-07-04: 5 mg via INTRAVENOUS
  Filled 2019-07-04: qty 5

## 2019-07-04 MED ORDER — METOPROLOL TARTRATE 25 MG PO TABS
25.0000 mg | ORAL_TABLET | Freq: Two times a day (BID) | ORAL | Status: DC
  Administered 2019-07-04 – 2019-07-06 (×4): 25 mg via ORAL
  Filled 2019-07-04 (×4): qty 1

## 2019-07-04 NOTE — Progress Notes (Addendum)
Pt HR sustaining between 1117-130. On assessment pt was asymptomatic and was laying down and watching TV. Notify prime. Awaiting callback. Will continue to monitor.  Update 2008: Talked to Dr. Tressia Miners and states will place order. Will continue to monitor.

## 2019-07-04 NOTE — Plan of Care (Signed)
  Problem: Education: Goal: Ability to demonstrate management of disease process will improve Outcome: Progressing   Problem: Activity: Goal: Capacity to carry out activities will improve Outcome: Progressing   Problem: Clinical Measurements: Goal: Respiratory complications will improve Outcome: Progressing   

## 2019-07-04 NOTE — Progress Notes (Addendum)
[facility] [place] NAME: Donald Molina    MR#:  [id number]  DATE OF BIRTH:  1951-01-18  SUBJECTIVE:  CHIEF COMPLAINT: Patient shortness of breath improved and denies any chest pain  REVIEW OF SYSTEMS:  CONSTITUTIONAL: No fever, fatigue or weakness.  EYES: No blurred or double vision.  EARS, NOSE, AND THROAT: No tinnitus or ear pain.  RESPIRATORY: No cough, shortness of breath, wheezing or hemoptysis.  CARDIOVASCULAR: No chest pain, orthopnea, edema.  GASTROINTESTINAL: No nausea, vomiting, diarrhea or abdominal pain.  GENITOURINARY: No dysuria, hematuria.  ENDOCRINE: No polyuria, nocturia,  HEMATOLOGY: No anemia, easy bruising or bleeding SKIN: No rash or lesion. MUSCULOSKELETAL: No joint pain or arthritis.   NEUROLOGIC: No tingling, numbness, weakness.  PSYCHIATRY: No anxiety or depression.   DRUG ALLERGIES:  No Known Allergies  VITALS:  Blood pressure (!) 142/86, pulse (!) 107, temperature 98.3 F (36.8 C), resp. rate 20, height 5\' 7"  (1.702 m), weight 78.5 kg, SpO2 98 %.  PHYSICAL EXAMINATION:  GENERAL:  68 y.o.-year-old patient lying in the bed with no acute distress.  EYES: Pupils equal, round, reactive to light and accommodation. No scleral icterus. Extraocular muscles intact.  HEENT: Head atraumatic, normocephalic. Oropharynx and nasopharynx clear.  NECK:  Supple, no jugular venous distention. No thyroid enlargement, no tenderness.  LUNGS: Normal breath sounds bilaterally, no wheezing, rales,rhonchi or crepitation. No use of accessory muscles of respiration.  CARDIOVASCULAR: S1, S2 normal. No murmurs, rubs, or gallops.  ABDOMEN: Soft, nontender, nondistended. Bowel sounds present. No organomegaly or mass.  EXTREMITIES: No pedal edema, cyanosis, or clubbing.  NEUROLOGIC: Cranial nerves II through XII are intact. Muscle strength 5/5 in all extremities. Sensation intact. Gait not checked.  PSYCHIATRIC: The patient is  alert and oriented x 3.  SKIN: No obvious rash, lesion, or ulcer.    LABORATORY PANEL:   CBC Recent Labs  Lab 07/03/19 1025  WBC 7.5  HGB 13.6  HCT 37.2*  PLT 229   ------------------------------------------------------------------------------------------------------------------  Chemistries  Recent Labs  Lab 07/03/19 1025 07/04/19 0456  NA 137 141  K 4.3 3.6  CL 103 101  CO2 25 29  GLUCOSE 102* 108*  BUN 12 14  CREATININE 0.89 0.92  CALCIUM 9.2 9.1  AST 33  --   ALT 16  --   ALKPHOS 52  --   BILITOT 1.4*  --    ------------------------------------------------------------------------------------------------------------------  Cardiac Enzymes No results for input(s): TROPONINI in the last 168 hours. ------------------------------------------------------------------------------------------------------------------  RADIOLOGY:  Dg Chest 2 View  Result Date: 07/03/2019 CLINICAL DATA:  Shortness of breath EXAM: CHEST - 2 VIEW COMPARISON:  05/03/2018 FINDINGS: Cardiomediastinal contours are stable. Mild bilateral interstitial opacities, similar to prior. Trace bilateral pleural effusions. No pneumothorax. IMPRESSION: Findings suggestive of mild CHF with possible early interstitial edema. Electronically Signed   By: Davina Poke M.D.   On: 07/03/2019 11:24    EKG:   Orders placed or performed during the hospital encounter of 07/03/19  . EKG 12-Lead  . EKG 12-Lead    ASSESSMENT AND PLAN:   68 y.o. male with a past medical history of CHF on Lasix, atrial fibrillation on Eliquis and amiodarone, and non-ischemic cardiomyopathy presenting to the ED with chief complaints of shortness of breath, bilateral lower extremity edema and cough x2 days  1. Acute on chronicDiastolic Congestive Heart Failure: Acute presentation likely due to volume overload with associated symptoms of SOB, BLE edema. BNP elevated at 1701 Non-Ischemic Cardiomyopathy. Last Echo  06/2018 withEF  45-50% - Afterload, Goal MAP <70: Continue losartanLisinoprilas BP allows - Diuretics: Furosemide 40mg  IVBID. Diureses >1L negative per day until approach euvolemia / worsening renal function. - Echocardiogram pending - Low salt diet,Check daily weight,Strict I&Os - CHF education -Cardiology consult to [facility] if clinically not improving -Enforced the importance of being compliant with his medications  2.  Paroxysmal atrial fibrillation-noted to be in RVR status post 1 dose of Lopressor - Rate controlled on Amiodarone, missed doses x 2 ran out.  Reinforced the importance of being compliant with medications - Eliquis  3.HTN- stable + Goal BP <130/80 - Continue losartan  4.DVT prophylaxis - Therapeutically anti-coagulated with Eliquis      All the records are reviewed and case discussed with Care Management/Social Workerr. Management plans discussed with the patient, he is in agreement.  CODE STATUS: fc  TOTAL TIME TAKING CARE OF THIS PATIENT: .   POSSIBLE D/C IN 1-2 DAYS, DEPENDING ON CLINICAL CONDITION.  Note: This dictation was prepared with Dragon dictation along with smaller phrase technology. Any transcriptional errors that result from this process are unintentional.   Ramonita Lab M.D on 07/04/2019 at 2:27 PM  Between 7am to 6pm - Pager - [phone] After 6pm go to [email or web address] - password [email or web address]  [facility]  Office  [phone]  CC: Primary care physician; Patient, No Pcp Per

## 2019-07-04 NOTE — Plan of Care (Signed)
  Problem: Education: Goal: Ability to demonstrate management of disease process will improve Outcome: Progressing   Problem: Activity: Goal: Capacity to carry out activities will improve Outcome: Progressing   Problem: Cardiac: Goal: Ability to achieve and maintain adequate cardiopulmonary perfusion will improve Outcome: Progressing   Problem: Education: Goal: Knowledge of General Education information will improve Description: Including pain rating scale, medication(s)/side effects and non-pharmacologic comfort measures Outcome: Progressing   Problem: Clinical Measurements: Goal: Ability to maintain clinical measurements within normal limits will improve Outcome: Progressing   Problem: Activity: Goal: Risk for activity intolerance will decrease Outcome: Progressing   Problem: Nutrition: Goal: Adequate nutrition will be maintained Outcome: Progressing

## 2019-07-05 LAB — RESPIRATORY PANEL BY PCR

## 2019-07-05 LAB — BASIC METABOLIC PANEL
Anion gap: 12 (ref 5–15)
BUN: 16 mg/dL (ref 8–23)
CO2: 28 mmol/L (ref 22–32)
Calcium: 8.9 mg/dL (ref 8.9–10.3)
Chloride: 100 mmol/L (ref 98–111)
Creatinine, Ser: 1.19 mg/dL (ref 0.61–1.24)
GFR calc Af Amer: >60 mL/min (ref >60)
GFR calc non Af Amer: >60 mL/min (ref >60)
Glucose, Bld: 106 mg/dL — ABNORMAL HIGH (ref 70–99)
Potassium: 4.4 mmol/L (ref 3.5–5.1)
Sodium: 140 mmol/L (ref 135–145)

## 2019-07-05 LAB — INFLUENZA PANEL BY PCR (TYPE A & B)
Influenza A By PCR: NEGATIVE
Influenza B By PCR: NEGATIVE

## 2019-07-05 LAB — PROCALCITONIN: Procalcitonin: <0.1 ng/mL

## 2019-07-05 LAB — ECHOCARDIOGRAM COMPLETE
Height: 67 in
Weight: 2849.6 oz

## 2019-07-05 LAB — LACTIC ACID, PLASMA: Lactic Acid, Venous: 1.5 mmol/L (ref 0.5–1.9)

## 2019-07-05 MED ORDER — FUROSEMIDE 40 MG PO TABS
40.0000 mg | ORAL_TABLET | Freq: Every day | ORAL | Status: DC
  Administered 2019-07-06: 40 mg via ORAL
  Filled 2019-07-05: qty 1

## 2019-07-05 NOTE — Progress Notes (Addendum)
[place] [facility] [place] NAME: Donald Molina    MR#:  [id number]  DATE OF BIRTH:  1951-10-20  SUBJECTIVE:  CHIEF COMPLAINT: Patient is resting comfortably.  Last night he was febrile 101.2 and today he is running low-grade temp   REVIEW OF SYSTEMS:  CONSTITUTIONAL: No fever, fatigue or weakness.  EYES: No blurred or double vision.  EARS, NOSE, AND THROAT: No tinnitus or ear pain.  RESPIRATORY: No cough, shortness of breath, wheezing or hemoptysis.  CARDIOVASCULAR: No chest pain, orthopnea, edema.  GASTROINTESTINAL: No nausea, vomiting, diarrhea or abdominal pain.  GENITOURINARY: No dysuria, hematuria.  ENDOCRINE: No polyuria, nocturia,  HEMATOLOGY: No anemia, easy bruising or bleeding SKIN: No rash or lesion. MUSCULOSKELETAL: No joint pain or arthritis.   NEUROLOGIC: No tingling, numbness, weakness.  PSYCHIATRY: No anxiety or depression.   DRUG ALLERGIES:  No Known Allergies  VITALS:  Blood pressure 104/84, pulse 72, temperature 99 F (37.2 C), temperature source Oral, resp. rate (!) 22, height 5\' 7"  (1.702 m), weight 77.6 kg, SpO2 98 %.  PHYSICAL EXAMINATION:  GENERAL:  68 y.o.-year-old patient lying in the bed with no acute distress.  EYES: Pupils equal, round, reactive to light and accommodation. No scleral icterus. Extraocular muscles intact.  HEENT: Head atraumatic, normocephalic. Oropharynx and nasopharynx clear.  NECK:  Supple, no jugular venous distention. No thyroid enlargement, no tenderness.  LUNGS: Normal breath sounds bilaterally, no wheezing, rales,rhonchi or crepitation. No use of accessory muscles of respiration.  CARDIOVASCULAR: S1, S2 normal. No murmurs, rubs, or gallops.  ABDOMEN: Soft, nontender, nondistended. Bowel sounds present.  EXTREMITIES: No pedal edema, cyanosis, or clubbing.  NEUROLOGIC: Cranial nerves II through XII are intact. Muscle strength 5/5 in all extremities. Sensation intact. Gait not  checked.  PSYCHIATRIC: The patient is alert and oriented x 3.  SKIN: No obvious rash, lesion, or ulcer.    LABORATORY PANEL:   CBC Recent Labs  Lab 07/03/19 1025  WBC 7.5  HGB 13.6  HCT 37.2*  PLT 229   ------------------------------------------------------------------------------------------------------------------  Chemistries  Recent Labs  Lab 07/03/19 1025  07/05/19 0531  NA 137   < > 140  K 4.3   < > 4.4  CL 103   < > 100  CO2 25   < > 28  GLUCOSE 102*   < > 106*  BUN 12   < > 16  CREATININE 0.89   < > 1.19  CALCIUM 9.2   < > 8.9  AST 33  --   --   ALT 16  --   --   ALKPHOS 52  --   --   BILITOT 1.4*  --   --    < > = values in this interval not displayed.   ------------------------------------------------------------------------------------------------------------------  Cardiac Enzymes No results for input(s): TROPONINI in the last 168 hours. ------------------------------------------------------------------------------------------------------------------  RADIOLOGY:  No results found.  EKG:   Orders placed or performed during the hospital encounter of 07/03/19  . EKG 12-Lead  . EKG 12-Lead    ASSESSMENT AND PLAN:   68 y.o. male with a past medical history of CHF on Lasix, atrial fibrillation on Eliquis and amiodarone, and non-ischemic cardiomyopathy presenting to the ED with chief complaints of shortness of breath, bilateral lower extremity edema and cough x2 days  1. Acute on chronicDiastolic Congestive Heart Failure: Acute presentation likely due to volume overload with associated symptoms of SOB, BLE edema. BNP elevated at 1701 Non-Ischemic Cardiomyopathy. Last Echo 06/2018  withEF 45-50% - Afterload, Goal MAP <70: Continue losartanLisinoprilas BP allows - Diuretics: Furosemide 40mg  IVBID given patient clinically is improving changed to Lasix 40 mg p.o. home medication from tomorrow  - Echocardiogram pending - Low salt diet,Check daily  weight,Strict I&Os - CHF education -Cardiology consult to [facility] if clinically not improving -Enforced the importance of being compliant with his medications  2.   Fever unclear etiology Chest x-ray negative blood cultures and urine cultures ordered Respiratory panel Lactic acid and procalcitonin levels Repeat a.m. labs Not considering antibiotics at this time will just observe patient for the next 24 hours   # Paroxysmal atrial fibrillation-noted to be in RVR status post 1 dose of Lopressor - Rate controlled on Amiodarone, missed doses x 2 ran out.  Reinforced the importance of being compliant with medications - Eliquis  #Hypertension blood pressure is soft holding antihypertensives Hold losartan  4.DVT prophylaxis - Therapeutically anti-coagulated with Eliquis      All the records are reviewed and case discussed with Care Management/Social Workerr. Management plans discussed with the patient, he is in agreement.  CODE STATUS: fc  TOTAL TIME TAKING CARE OF THIS PATIENT: .   POSSIBLE D/C IN 1-2 DAYS, DEPENDING ON CLINICAL CONDITION.  Note: This dictation was prepared with Dragon dictation along with smaller phrase technology. Any transcriptional errors that result from this process are unintentional.   Ramonita Lab M.D on 07/05/2019 at 1:48 PM  Between 7am to 6pm - Pager - [phone] After 6pm go to [email or web address] - password [email or web address]  [facility]  Office  [phone]  CC: Primary care physician; Patient, No Pcp Per

## 2019-07-05 NOTE — Plan of Care (Signed)
  Problem: Activity: Goal: Capacity to carry out activities will improve Outcome: Progressing   Problem: Education: Goal: Knowledge of General Education information will improve Description: Including pain rating scale, medication(s)/side effects and non-pharmacologic comfort measures Outcome: Progressing   Problem: Clinical Measurements: Goal: Respiratory complications will improve Outcome: Progressing   Problem: Activity: Goal: Risk for activity intolerance will decrease Outcome: Progressing   

## 2019-07-06 LAB — BLOOD CULTURE ID PANEL (REFLEXED)

## 2019-07-06 LAB — BASIC METABOLIC PANEL
Anion gap: 8 (ref 5–15)
BUN: 20 mg/dL (ref 8–23)
CO2: 29 mmol/L (ref 22–32)
Calcium: 8.8 mg/dL — ABNORMAL LOW (ref 8.9–10.3)
Chloride: 101 mmol/L (ref 98–111)
Creatinine, Ser: 1.1 mg/dL (ref 0.61–1.24)
GFR calc Af Amer: >60 mL/min (ref >60)
GFR calc non Af Amer: >60 mL/min (ref >60)
Glucose, Bld: 112 mg/dL — ABNORMAL HIGH (ref 70–99)
Potassium: 3.7 mmol/L (ref 3.5–5.1)
Sodium: 138 mmol/L (ref 135–145)

## 2019-07-06 LAB — CBC WITH DIFFERENTIAL/PLATELET
Abs Immature Granulocytes: 0.04 10*3/uL (ref 0.00–0.07)
Basophils Absolute: 0 10*3/uL (ref 0.0–0.1)
Basophils Relative: 0 %
Eosinophils Absolute: 0.1 10*3/uL (ref 0.0–0.5)
Eosinophils Relative: 1 %
HCT: 39 % (ref 39.0–52.0)
Hemoglobin: 14.2 g/dL (ref 13.0–17.0)
Immature Granulocytes: 0 %
Lymphocytes Relative: 21 %
Lymphs Abs: 2.1 10*3/uL (ref 0.7–4.0)
MCH: 31.6 pg (ref 26.0–34.0)
MCHC: 36.4 g/dL — ABNORMAL HIGH (ref 30.0–36.0)
MCV: 86.9 fL (ref 80.0–100.0)
Monocytes Absolute: 0.7 10*3/uL (ref 0.1–1.0)
Monocytes Relative: 7 %
Neutro Abs: 7 10*3/uL (ref 1.7–7.7)
Neutrophils Relative %: 71 %
Platelets: 229 10*3/uL (ref 150–400)
RBC: 4.49 MIL/uL (ref 4.22–5.81)
RDW: 11.7 % (ref 11.5–15.5)
WBC: 9.9 10*3/uL (ref 4.0–10.5)
nRBC: 0 % (ref 0.0–0.2)

## 2019-07-06 LAB — PROCALCITONIN: Procalcitonin: <0.1 ng/mL

## 2019-07-06 LAB — HIV ANTIBODY (ROUTINE TESTING W REFLEX): HIV Screen 4th Generation wRfx: NONREACTIVE

## 2019-07-06 MED ORDER — LEVOFLOXACIN 500 MG PO TABS: 500.0000 mg | ORAL_TABLET | Freq: Every day | ORAL | 0 refills | Status: AC

## 2019-07-06 MED ORDER — METOPROLOL SUCCINATE ER 25 MG PO TB24: 25.0000 mg | ORAL_TABLET | Freq: Every day | ORAL | 11 refills | Status: DC

## 2019-07-06 NOTE — Progress Notes (Signed)
Patient discharged home with CHF education provided, follow up information and medication regimen information provided patient understands his medication schedule and follow up appointments. No concerns at this time.

## 2019-07-06 NOTE — Plan of Care (Signed)
  Problem: Education: Goal: Ability to demonstrate management of disease process will improve Outcome: Progressing   Problem: Activity: Goal: Capacity to carry out activities will improve Outcome: Progressing   Problem: Education: Goal: Knowledge of General Education information will improve Description: Including pain rating scale, medication(s)/side effects and non-pharmacologic comfort measures Outcome: Progressing   Problem: Clinical Measurements: Goal: Respiratory complications will improve Outcome: Progressing   

## 2019-07-06 NOTE — Plan of Care (Signed)
Problem: Education: Goal: Ability to demonstrate management of disease process will improve 07/06/2019 1523 by Jerene Dilling, RN Outcome: Adequate for Discharge 07/06/2019 1523 by Jerene Dilling, RN Outcome: Adequate for Discharge Goal: Ability to verbalize understanding of medication therapies will improve 07/06/2019 1523 by Jerene Dilling, RN Outcome: Adequate for Discharge 07/06/2019 1523 by Jerene Dilling, RN Outcome: Adequate for Discharge Goal: Individualized Educational Video(s) 07/06/2019 1523 by Jerene Dilling, RN Outcome: Adequate for Discharge 07/06/2019 1523 by Jerene Dilling, RN Outcome: Adequate for Discharge   Problem: Activity: Goal: Capacity to carry out activities will improve 07/06/2019 1523 by Jerene Dilling, RN Outcome: Adequate for Discharge 07/06/2019 1523 by Jerene Dilling, RN Outcome: Adequate for Discharge   Problem: Cardiac: Goal: Ability to achieve and maintain adequate cardiopulmonary perfusion will improve 07/06/2019 1523 by Jerene Dilling, RN Outcome: Adequate for Discharge 07/06/2019 1523 by Jerene Dilling, RN Outcome: Adequate for Discharge   Problem: Education: Goal: Knowledge of General Education information will improve Description: Including pain rating scale, medication(s)/side effects and non-pharmacologic comfort measures 07/06/2019 1523 by Jerene Dilling, RN Outcome: Adequate for Discharge 07/06/2019 1523 by Jerene Dilling, RN Outcome: Adequate for Discharge   Problem: Health Behavior/Discharge Planning: Goal: Ability to manage health-related needs will improve 07/06/2019 1523 by Jerene Dilling, RN Outcome: Adequate for Discharge 07/06/2019 1523 by Jerene Dilling, RN Outcome: Adequate for Discharge   Problem: Clinical Measurements: Goal: Ability to maintain clinical measurements within normal limits will improve 07/06/2019 1523 by Jerene Dilling, RN Outcome: Adequate for Discharge 07/06/2019 1523 by Jerene Dilling, RN Outcome: Adequate for Discharge Goal: Will remain free from infection 07/06/2019 1523 by Jerene Dilling, RN Outcome: Adequate for Discharge 07/06/2019 1523 by Jerene Dilling, RN Outcome: Adequate for Discharge Goal: Diagnostic test results will improve 07/06/2019 1523 by Jerene Dilling, RN Outcome: Adequate for Discharge 07/06/2019 1523 by Jerene Dilling, RN Outcome: Adequate for Discharge Goal: Respiratory complications will improve 07/06/2019 1523 by Jerene Dilling, RN Outcome: Adequate for Discharge 07/06/2019 1523 by Jerene Dilling, RN Outcome: Adequate for Discharge Goal: Cardiovascular complication will be avoided 07/06/2019 1523 by Jerene Dilling, RN Outcome: Adequate for Discharge 07/06/2019 1523 by Jerene Dilling, RN Outcome: Adequate for Discharge   Problem: Activity: Goal: Risk for activity intolerance will decrease 07/06/2019 1523 by Jerene Dilling, RN Outcome: Adequate for Discharge 07/06/2019 1523 by Jerene Dilling, RN Outcome: Adequate for Discharge   Problem: Nutrition: Goal: Adequate nutrition will be maintained 07/06/2019 1523 by Jerene Dilling, RN Outcome: Adequate for Discharge 07/06/2019 1523 by Jerene Dilling, RN Outcome: Adequate for Discharge   Problem: Coping: Goal: Level of anxiety will decrease 07/06/2019 1523 by Jerene Dilling, RN Outcome: Adequate for Discharge 07/06/2019 1523 by Jerene Dilling, RN Outcome: Adequate for Discharge   Problem: Elimination: Goal: Will not experience complications related to bowel motility 07/06/2019 1523 by Jerene Dilling, RN Outcome: Adequate for Discharge 07/06/2019 1523 by Jerene Dilling, RN Outcome: Adequate for Discharge Goal: Will not experience complications related to urinary retention 07/06/2019 1523 by Jerene Dilling, RN Outcome: Adequate for Discharge 07/06/2019 1523 by Jerene Dilling, RN Outcome: Adequate for Discharge   Problem: Pain Managment: Goal: General  experience of comfort will improve 07/06/2019 1523 by Jerene Dilling, RN Outcome: Adequate for Discharge 07/06/2019 1523 by Jerene Dilling, RN Outcome: Adequate for Discharge   Problem: Safety: Goal: Ability to remain  free from injury will improve 07/06/2019 1523 by Alen Blew, RN Outcome: Adequate for Discharge 07/06/2019 1523 by Alen Blew, RN Outcome: Adequate for Discharge   Problem: Skin Integrity: Goal: Risk for impaired skin integrity will decrease 07/06/2019 1523 by Alen Blew, RN Outcome: Adequate for Discharge 07/06/2019 1523 by Alen Blew, RN Outcome: Adequate for Discharge

## 2019-07-06 NOTE — Progress Notes (Signed)
Patient has viewed EMMI videos, and successfully completed his CHF teach back quiz, he inquires about his medication regimen, and will be provided easy to follow instructions from this RN upon discharge.

## 2019-07-06 NOTE — Plan of Care (Signed)

## 2019-07-06 NOTE — Progress Notes (Addendum)
PHARMACY - PHYSICIAN COMMUNICATION CRITICAL VALUE ALERT - BLOOD CULTURE IDENTIFICATION (BCID)  Donald Molina is an 68 y.o. male who presented to [facility] on 07/03/2019 with a chief complaint of CHF.  Assessment:  Positve Bcx. 1 out 4 aerobic GPC, mec A negative, Staph sp positive (coagulase negative staphylococcus). WBC 7.5 > 9.9; no documented fevers, SBP 108-18 & DBP 79-87; HR (ECG) 106. Currently the patient is not on any antibiotics. This is likely a contaminant. I do not think antibiotics are needed at this time.   Name of physician (or Provider) Contacted: Dr. Stark Jock  Current antibiotics: None   Changes to prescribed antibiotics recommended:  Recommendation accepted by provider.    Results for orders placed or performed during the hospital encounter of 07/03/19  Blood Culture ID Panel (Reflexed) (Collected: 07/05/2019  2:02 PM)  Result Value Ref Range   Enterococcus species NOT DETECTED NOT DETECTED   Listeria monocytogenes NOT DETECTED NOT DETECTED   Staphylococcus species DETECTED (A) NOT DETECTED   Staphylococcus aureus (BCID) NOT DETECTED NOT DETECTED   Methicillin resistance NOT DETECTED NOT DETECTED   Streptococcus species NOT DETECTED NOT DETECTED   Streptococcus agalactiae NOT DETECTED NOT DETECTED   Streptococcus pneumoniae NOT DETECTED NOT DETECTED   Streptococcus pyogenes NOT DETECTED NOT DETECTED   Acinetobacter baumannii NOT DETECTED NOT DETECTED   Enterobacteriaceae species NOT DETECTED NOT DETECTED   Enterobacter cloacae complex NOT DETECTED NOT DETECTED   Escherichia coli NOT DETECTED NOT DETECTED   Klebsiella oxytoca NOT DETECTED NOT DETECTED   Klebsiella pneumoniae NOT DETECTED NOT DETECTED   Proteus species NOT DETECTED NOT DETECTED   Serratia marcescens NOT DETECTED NOT DETECTED   Haemophilus influenzae NOT DETECTED NOT DETECTED   Neisseria meningitidis NOT DETECTED NOT DETECTED   Pseudomonas aeruginosa NOT DETECTED NOT DETECTED   Candida albicans NOT  DETECTED NOT DETECTED   Candida glabrata NOT DETECTED NOT DETECTED   Candida krusei NOT DETECTED NOT DETECTED   Candida parapsilosis NOT DETECTED NOT DETECTED   Candida tropicalis NOT DETECTED NOT DETECTED    Rowland Lathe 07/06/2019  9:38 AM

## 2019-07-06 NOTE — Care Management Important Message (Signed)
Important Message  Patient Details  Name: Donald Molina MRN: [id number] Date of Birth: September 11, 1951   Medicare Important Message Given:  Yes     Juliann Pulse A Layton Naves 07/06/2019, 1:23 PM

## 2019-07-06 NOTE — Discharge Summary (Signed)
[facility] NAME: Donald Molina    MR#:  [id number]  DATE OF BIRTH:  Jul 11, 1951  DATE OF ADMISSION:  07/03/2019   ADMITTING PHYSICIAN: Lang Snow, NP  DATE OF DISCHARGE: 07/06/2019  PRIMARY CARE PHYSICIAN: Patient, No Pcp Per   ADMISSION DIAGNOSIS:  Shortness of Breath Edema  DISCHARGE DIAGNOSIS:  Active Problems:   Acute on chronic diastolic CHF (congestive heart failure) ([place])  SECONDARY DIAGNOSIS:   Past Medical History:  Diagnosis Date  . HFrEF (heart failure with reduced ejection fraction) ([place])    a. 04/2018 Echo: EF 25-30%; b. 06/2018 Echo: EF 45-50%. diff HK, Gr1 DD. Nl RV fxn.  . Hypertension   . NICM (nonischemic cardiomyopathy) ([place])    a. 04/2018 Echo: EF 25-30%, diff HK, mildly dil Ao root, mod MR, mod dil LA, mildly to mod reduced RV fxn, mildly dil RA, PASP 27mmHg; b. 04/2018 Cath: Nl cors. CO 2.98/CI 1.47; c. 06/2018 Echo: EF 45-50%. diff HK, Gr1 DD. Nl RV fxn.  . Persistent atrial fibrillation    a. Dx 04/2018 s/p DCCV and amio loading. CHA2DS2VASc 3-->eliquis.   HOSPITAL COURSE:  Chief complaint; shortness of breath   History of presenting complaint; 68 year old male with a past medical history of CHF on Lasix, atrial fibrillation on Eliquis and amiodarone, and non-ischemic cardiomyopathy presenting to the ED with chief complaints of shortness of breath, bilateral lower extremity edema and cough x2 days.  Patient was diagnosed with CHF exacerbation.  Hospital course; 1.Acute on chronicDiastolicCongestive Heart Failure: Acute presentation likely due to volume overload with associated symptoms of SOB, BLE edema. BNP elevated at 1701 Non-Ischemic Cardiomyopathy. Last Echo8/2019 withEF45-50%.  Patient was diuresed with IV Lasix.  Transition to p.o. Lasix on discharge.  2D echocardiogram done with ejection fraction of 30 to 35%.  Continue low-sodium diet restriction.  Monitor daily weight.   Patient significantly improved already prior to my evaluation and requesting to be discharged home today. Continue losartan, beta-blocker and Lasix.  Follow-up with primary care physician.  Outpatient cardiology follow-up as needed.  2.  Fever unclear etiology; resolved 1 set of blood culture growing staph species.  Felt to be most likely contaminant.  Repeat blood cultures requested today.  Since patient requesting to be discharged home today, placed empirically on p.o. levofloxacin for a few days and he will follow-up with his primary care physician for follow-up on final repeat cultures to ensure no organism growing. Patient has remained afebrile for more than 24 hours.  3Paroxysmal atrial fibrillation-noted to be in RVR status post 1 dose of Lopressor - Rate controlled on Amiodarone and metoprolol.  Continue the same.  Continue Eliquis on discharge  4. hypertension  Blood pressure controlled on current regimen.  Continue the same   DISCHARGE CONDITIONS:  Stable CONSULTS OBTAINED:   DRUG ALLERGIES:  No Known Allergies DISCHARGE MEDICATIONS:   Allergies as of 07/06/2019   No Known Allergies     Medication List    TAKE these medications   amiodarone 200 MG tablet Commonly known as: PACERONE Take 1 tablet (200 mg total) by mouth daily.   apixaban 5 MG Tabs tablet Commonly known as: ELIQUIS Take 1 tablet (5 mg total) by mouth 2 (two) times daily.   furosemide 40 MG tablet Commonly known as: LASIX TAKE 1 TABLET BY MOUTH EVERY DAY   levofloxacin 500 MG tablet Commonly known as: Levaquin Take 1 tablet (500 mg total) by mouth daily for 5  days.   losartan 50 MG tablet Commonly known as: COZAAR Take 75 mg by mouth daily. Pt take both a 25 mg and a 50 mg losartan   metoprolol succinate 25 MG 24 hr tablet Commonly known as: Toprol XL Take 1 tablet (25 mg total) by mouth daily.        DISCHARGE INSTRUCTIONS:   DIET:  Cardiac diet DISCHARGE CONDITION:  Stable  ACTIVITY:  Activity as tolerated OXYGEN:  Home Oxygen: No.  Oxygen Delivery: room air DISCHARGE LOCATION:  home   If you experience worsening of your admission symptoms, develop shortness of breath, life threatening emergency, suicidal or homicidal thoughts you must seek medical attention immediately by calling 911 or calling your MD immediately  if symptoms less severe.  You Must read complete instructions/literature along with all the possible adverse reactions/side effects for all the Medicines you take and that have been prescribed to you. Take any new Medicines after you have completely understood and accpet all the possible adverse reactions/side effects.   Please note  You were cared for by a hospitalist during your hospital stay. If you have any questions about your discharge medications or the care you received while you were in the hospital after you are discharged, you can call the unit and asked to speak with the hospitalist on call if the hospitalist that took care of you is not available. Once you are discharged, your primary care physician will handle any further medical issues. Please note that NO REFILLS for any discharge medications will be authorized once you are discharged, as it is imperative that you return to your primary care physician (or establish a relationship with a primary care physician if you do not have one) for your aftercare needs so that they can reassess your need for medications and monitor your lab values.    On the day of Discharge:  VITAL SIGNS:  Blood pressure 117/79, pulse 74, temperature 98.4 F (36.9 C), temperature source Oral, resp. rate 18, height 5\' 7"  (1.702 m), weight 80.8 kg, SpO2 99 %. PHYSICAL EXAMINATION:  GENERAL:  68 y.o.-year-old patient lying in the bed with no acute distress.  EYES: Pupils equal, round, reactive to light and accommodation. No scleral icterus. Extraocular muscles intact.  HEENT: Head atraumatic, normocephalic.  Oropharynx and nasopharynx clear.  NECK:  Supple, no jugular venous distention. No thyroid enlargement, no tenderness.  LUNGS: Normal breath sounds bilaterally, no wheezing, rales,rhonchi or crepitation. No use of accessory muscles of respiration.  CARDIOVASCULAR: S1, S2 normal. No murmurs, rubs, or gallops.  ABDOMEN: Soft, non-tender, non-distended. Bowel sounds present. No organomegaly or mass.  EXTREMITIES: No pedal edema, cyanosis, or clubbing.  NEUROLOGIC: Cranial nerves II through XII are intact. Muscle strength 5/5 in all extremities. Sensation intact. Gait not checked.  PSYCHIATRIC: The patient is alert and oriented x 3.  SKIN: No obvious rash, lesion, or ulcer.  DATA REVIEW:   CBC Recent Labs  Lab 07/06/19 0422  WBC 9.9  HGB 14.2  HCT 39.0  PLT 229    Chemistries  Recent Labs  Lab 07/03/19 1025  07/06/19 0422  NA 137   < > 138  K 4.3   < > 3.7  CL 103   < > 101  CO2 25   < > 29  GLUCOSE 102*   < > 112*  BUN 12   < > 20  CREATININE 0.89   < > 1.10  CALCIUM 9.2   < > 8.8*  AST 33  --   --  ALT 16  --   --   ALKPHOS 52  --   --   BILITOT 1.4*  --   --    < > = values in this interval not displayed.     Microbiology Results  Results for orders placed or performed during the hospital encounter of 07/03/19  SARS Coronavirus 2 [facility](Hospital order, Performed in [facility] hospital lab) Nasopharyngeal Nasopharyngeal Swab     Status: None   Collection Time: 07/03/19 12:12 PM   Specimen: Nasopharyngeal Swab  Result Value Ref Range Status   SARS Coronavirus 2 NEGATIVE NEGATIVE Final    Comment: (NOTE) If result is NEGATIVE SARS-CoV-2 target nucleic acids are NOT DETECTED. The SARS-CoV-2 RNA is generally detectable in upper and lower  respiratory specimens during the acute phase of infection. The lowest  concentration of SARS-CoV-2 viral copies this assay can detect is 250  copies / mL. A negative result does not preclude SARS-CoV-2 infection  and should not be used  as the sole basis for treatment or other  patient management decisions.  A negative result may occur with  improper specimen collection / handling, submission of specimen other  than nasopharyngeal swab, presence of viral mutation(s) within the  areas targeted by this assay, and inadequate number of viral copies  (<250 copies / mL). A negative result must be combined with clinical  observations, patient history, and epidemiological information. If result is POSITIVE SARS-CoV-2 target nucleic acids are DETECTED. The SARS-CoV-2 RNA is generally detectable in upper and lower  respiratory specimens dur ing the acute phase of infection.  Positive  results are indicative of active infection with SARS-CoV-2.  Clinical  correlation with patient history and other diagnostic information is  necessary to determine patient infection status.  Positive results do  not rule out bacterial infection or co-infection with other viruses. If result is PRESUMPTIVE POSTIVE SARS-CoV-2 nucleic acids MAY BE PRESENT.   A presumptive positive result was obtained on the submitted specimen  and confirmed on repeat testing.  While 2019 novel coronavirus  (SARS-CoV-2) nucleic acids may be present in the submitted sample  additional confirmatory testing may be necessary for epidemiological  and / or clinical management purposes  to differentiate between  SARS-CoV-2 and other Sarbecovirus currently known to infect humans.  If clinically indicated additional testing with an alternate test  methodology [id number]) is advised. The SARS-CoV-2 RNA is generally  detectable in upper and lower respiratory sp ecimens during the acute  phase of infection. The expected result is Negative. Fact Sheet for Patients:  BoilerBrush.com.cyhttps://www.fda.gov/media/136312/download Fact Sheet for Healthcare Providers: https://pope.com/https://www.fda.gov/media/136313/download This test is not yet approved or cleared by the [place] FDA and has been authorized for  detection and/or diagnosis of SARS-CoV-2 by FDA under an Emergency Use Authorization (EUA).  This EUA will remain in effect (meaning this test can be used) for the duration of the COVID-19 declaration under Section 564(b)(1) of the Act, 21 U.S.C. section 360bbb-3(b)(1), unless the authorization is terminated or revoked sooner. Performed at [facility], [address] [place], [place] [address]   CULTURE, BLOOD (ROUTINE X 2) w Reflex to ID Panel     Status: None (Preliminary result)   Collection Time: 07/05/19  2:02 PM   Specimen: BLOOD  Result Value Ref Range Status   Specimen Description   Final    BLOOD RIGHT ANTECUBITAL Performed at [facility], [address] [place], [place] [address]    Special Requests   Final  BOTTLES DRAWN AEROBIC AND ANAEROBIC Blood Culture results may not be optimal due to an excessive volume of blood received in culture bottles Performed at [facility], [address] [place], [place] [address]    Culture  Setup Time   Final    GRAM POSITIVE COCCI AEROBIC BOTTLE ONLY CRITICAL RESULT CALLED TO, READ BACK BY AND VERIFIED WITH: KISHAN PATEL AT 1660 ON 07/06/2019 [facility]. Performed at [facility], [address], [place], [place] [address]    Culture GRAM POSITIVE COCCI  Final   Report Status PENDING  Incomplete  Blood Culture ID Panel (Reflexed)     Status: Abnormal   Collection Time: 07/05/19  2:02 PM  Result Value Ref Range Status   Enterococcus species NOT DETECTED NOT DETECTED Final   Listeria monocytogenes NOT DETECTED NOT DETECTED Final   Staphylococcus species DETECTED (A) NOT DETECTED Final    Comment: Methicillin (oxacillin) susceptible coagulase negative staphylococcus. Possible blood culture contaminant (unless isolated from more than one blood culture draw or clinical case suggests pathogenicity). No antibiotic treatment is indicated for blood  culture contaminants. CRITICAL RESULT CALLED TO, READ BACK BY  AND VERIFIED WITH: KISHAN PATEL AT 9977 ON 07/06/2019 [facility].    Staphylococcus aureus (BCID) NOT DETECTED NOT DETECTED Final   Methicillin resistance NOT DETECTED NOT DETECTED Final   Streptococcus species NOT DETECTED NOT DETECTED Final   Streptococcus agalactiae NOT DETECTED NOT DETECTED Final   Streptococcus pneumoniae NOT DETECTED NOT DETECTED Final   Streptococcus pyogenes NOT DETECTED NOT DETECTED Final   Acinetobacter baumannii NOT DETECTED NOT DETECTED Final   Enterobacteriaceae species NOT DETECTED NOT DETECTED Final   Enterobacter cloacae complex NOT DETECTED NOT DETECTED Final   Escherichia coli NOT DETECTED NOT DETECTED Final   Klebsiella oxytoca NOT DETECTED NOT DETECTED Final   Klebsiella pneumoniae NOT DETECTED NOT DETECTED Final   Proteus species NOT DETECTED NOT DETECTED Final   Serratia marcescens NOT DETECTED NOT DETECTED Final   Haemophilus influenzae NOT DETECTED NOT DETECTED Final   Neisseria meningitidis NOT DETECTED NOT DETECTED Final   Pseudomonas aeruginosa NOT DETECTED NOT DETECTED Final   Candida albicans NOT DETECTED NOT DETECTED Final   Candida glabrata NOT DETECTED NOT DETECTED Final   Candida krusei NOT DETECTED NOT DETECTED Final   Candida parapsilosis NOT DETECTED NOT DETECTED Final   Candida tropicalis NOT DETECTED NOT DETECTED Final    Comment: Performed at [facility], [address] [place], [place] [address]  CULTURE, BLOOD (ROUTINE X 2) w Reflex to ID Panel     Status: None (Preliminary result)   Collection Time: 07/05/19  2:09 PM   Specimen: BLOOD  Result Value Ref Range Status   Specimen Description BLOOD LAC  Final   Special Requests   Final    BOTTLES DRAWN AEROBIC AND ANAEROBIC Blood Culture adequate volume   Culture   Final    NO GROWTH < 24 HOURS Performed at [facility], [address] [place], [place] [address]    Report Status PENDING  Incomplete  Urine Culture     Status: None (Preliminary result)    Collection Time: 07/05/19  3:43 PM   Specimen: Urine, Clean Catch  Result Value Ref Range Status   Specimen Description   Final    URINE, CLEAN CATCH Performed at [facility], [address], [place], [place] [address]    Special Requests   Final    NONE Performed at [facility], [address] [place], [place] [address]    Culture PENDING  Incomplete   Report Status PENDING  Incomplete  Respiratory Panel by PCR     Status: None   Collection Time: 07/05/19  3:43 PM   Specimen: Nasopharyngeal Swab; Respiratory  Result Value Ref Range Status   Adenovirus NOT DETECTED NOT DETECTED Final   Coronavirus 229E NOT DETECTED NOT DETECTED Final    Comment: (NOTE) The Coronavirus on the Respiratory Panel, DOES NOT test for the novel  Coronavirus (2019 nCoV)    Coronavirus HKU1 NOT DETECTED NOT DETECTED Final   Coronavirus NL63 NOT DETECTED NOT DETECTED Final   Coronavirus OC43 NOT DETECTED NOT DETECTED Final   Metapneumovirus NOT DETECTED NOT DETECTED Final   Rhinovirus / Enterovirus NOT DETECTED NOT DETECTED Final   Influenza A NOT DETECTED NOT DETECTED Final   Influenza B NOT DETECTED NOT DETECTED Final   Parainfluenza Virus 1 NOT DETECTED NOT DETECTED Final   Parainfluenza Virus 2 NOT DETECTED NOT DETECTED Final   Parainfluenza Virus 3 NOT DETECTED NOT DETECTED Final   Parainfluenza Virus 4 NOT DETECTED NOT DETECTED Final   Respiratory Syncytial Virus NOT DETECTED NOT DETECTED Final   Bordetella pertussis NOT DETECTED NOT DETECTED Final   Chlamydophila pneumoniae NOT DETECTED NOT DETECTED Final   Mycoplasma pneumoniae NOT DETECTED NOT DETECTED Final    Comment: Performed at [facility], [address] [place], [place] [address]    RADIOLOGY:  No results found.   Management plans discussed with the patient, family and they are in agreement.  CODE STATUS: Full Code   TOTAL TIME TAKING CARE OF THIS PATIENT: 34 minutes.    Etosha Wetherell M.D on 07/06/2019  at 3:00 PM  Between 7am to 6pm - Pager - [phone]  After 6pm go to [email or web address] - Social research officer, governmentpassword [email or web address]  [facility]  Office  [phone]  CC: Primary care physician; Patient, No Pcp Per   Note: This dictation was prepared with Dragon dictation along with smaller phrase technology. Any transcriptional errors that result from this process are unintentional.

## 2019-07-07 ENCOUNTER — Telehealth: Payer: Self-pay | Admitting: Physician Assistant

## 2019-07-07 LAB — CULTURE, BLOOD (ROUTINE X 2)

## 2019-07-07 LAB — URINE CULTURE: Culture: >=100000 — AB

## 2019-07-07 NOTE — Telephone Encounter (Signed)
Donald Molina was hospitalized 8/7- 07/06/2019 for acute on chronic CHF, fever and atrial fibrillation RVR.  He was put on metoprolol and amiodarone as well as losartan and Lasix.  His sister went to pick up the amiodarone prescription, but was unable to do so.  She she was told someone needed to call the pharmacy to authorize it.  I called the pharmacy.  They were trying to fill an amiodarone prescription for 400 mg tablets that had expired. I explained that the patient needed amiodarone 200 mg tablets.  There was a prescription for that, but it was originally dated 06/02/2018, and had never been filled.  I authorized a 30-day supply of the amiodarone with no refills.  I called Ms. Laswell back and advised her that I had authorized 30 days of the amiodarone, but he would have to be seen by cardiology before it could be refilled.  I advised her that it had never been filled originally, and she asked the patient about it, but he could not explain why.  I advised her that they needed to call the [facility] office tomorrow to make an appointment.  Rosaria Ferries, PA-C 07/07/2019 6:57 PM Beeper [phone]

## 2019-07-08 NOTE — Telephone Encounter (Signed)
Incoming call from sister, Faythe Dingwall. She reported that she was able to get medication for 30 day supply.   Pt will be coming into clinic later this month for f/u on Aug 21st.   Advised pt to call for any further questions or concerns.

## 2019-07-08 NOTE — Telephone Encounter (Signed)
Called sister at the request of Elenor Quinones, PA. No answer. LMTCB to see if she was able to get medication filled w/o difficulty. Pt has appt scheduled for 8/21 with Christell Faith, PA.

## 2019-07-10 ENCOUNTER — Other Ambulatory Visit: Payer: Self-pay

## 2019-07-10 LAB — CULTURE, BLOOD (ROUTINE X 2)
Culture: NO GROWTH
Special Requests: ADEQUATE

## 2019-07-10 MED ORDER — AMIODARONE HCL 200 MG PO TABS: 200.0000 mg | ORAL_TABLET | Freq: Every day | ORAL | 3 refills | Status: DC

## 2019-07-10 NOTE — Progress Notes (Signed)
Patient ID: Donald Molina, male    DOB: 07-10-1951, 68 y.o.   MRN: [id number]  HPI  Donald Molina is a 68 y/o male with a history of atrial fibrillation, HTN, remote tobacco use and chronic heart failure.   Echo report from 07/03/2019 reviewed and showed an EF of 30-35% along with mild/moderate Donald. TEE and cardioversion report from 05/07/18 reviewed and showed an EF of 20-25% along with trivial AR, mild/mod Donald & TR and small pleural effusion. Echo report from 05/03/18 reviewed which showed an EF of 25-30% along with moderate Donald/TR and moderately elevated PA pressure of 52 mm Hg.   RHC/ LHC done 05/05/18 showed normal coronary arteries, severely elevated filling pressures, moderate pulmonary HTN and severely reduced cardiac output. CO was 2.98 with cardiac index of 1.47  Admitted 07/03/2019 due to acute on chronic heart failure. Initially given IV lasix with transition to oral diuretics. Discharged after 3 days.   He presents today for a follow-up visit with a chief complaint of very minimal fatigue upon moderate exertion. He has no other symptoms and specifically denies any difficulty sleeping, abdominal distention, palpitations, pedal edema, chest pain, shortness of breath, cough, dizziness or weight gain.   Past Medical History:  Diagnosis Date  . HFrEF (heart failure with reduced ejection fraction) ([place])    a. 04/2018 Echo: EF 25-30%; b. 06/2018 Echo: EF 45-50%. diff HK, Gr1 DD. Nl RV fxn.  . Hypertension   . NICM (nonischemic cardiomyopathy) ([place])    a. 04/2018 Echo: EF 25-30%, diff HK, mildly dil Ao root, mod Donald, mod dil LA, mildly to mod reduced RV fxn, mildly dil RA, PASP 47mmHg; b. 04/2018 Cath: Nl cors. CO 2.98/CI 1.47; c. 06/2018 Echo: EF 45-50%. diff HK, Gr1 DD. Nl RV fxn.  . Persistent atrial fibrillation    a. Dx 04/2018 s/p DCCV and amio loading. CHA2DS2VASc 3-->eliquis.   Past Surgical History:  Procedure Laterality Date  . CARDIAC CATHETERIZATION    . RIGHT/LEFT HEART CATH AND CORONARY  ANGIOGRAPHY N/A 05/05/2018   Procedure: RIGHT/LEFT HEART CATH AND CORONARY ANGIOGRAPHY;  Surgeon: Wellington Hampshire, MD;  Location: [facility];  Service: Cardiovascular;  Laterality: N/A;  . TEE WITHOUT CARDIOVERSION N/A 05/07/2018   Procedure: TRANSESOPHAGEAL ECHOCARDIOGRAM (TEE);  Surgeon: Nelva Bush, MD;  Location: [facility];  Service: Cardiovascular;  Laterality: N/A;   Family History  Problem Relation Age of Onset  . Cancer Mother        pancreatic  . Cervical cancer Mother   . Dementia Father   . Heart failure Father   . Heart disease Sister    Social History   Tobacco Use  . Smoking status: Former Smoker    Years: 10.00    Quit date: 05/28/1998    Years since quitting: 21.1  . Smokeless tobacco: Former Systems developer    Types: [place] date: 05/28/1998  Substance Use Topics  . Alcohol use: Yes    Comment: occassional 1 beer   No Known Allergies   Prior to Admission medications   Medication Sig Start Date End Date Taking? Authorizing Provider  amiodarone (PACERONE) 200 MG tablet Take 1 tablet (200 mg total) by mouth daily. 07/10/19  Yes Theora Gianotti, NP  apixaban (ELIQUIS) 5 MG TABS tablet Take 1 tablet (5 mg total) by mouth 2 (two) times daily. 08/06/18  Yes Theora Gianotti, NP  furosemide (LASIX) 40 MG tablet TAKE 1 TABLET BY MOUTH EVERY DAY 09/08/18  Yes Gollan,  Tollie Pizza, MD  losartan (COZAAR) 50 MG tablet Take 100 mg by mouth daily.    Yes [provider]  metoprolol succinate (TOPROL XL) 25 MG 24 hr tablet Take 1 tablet (25 mg total) by mouth daily. 07/06/19 07/05/20 Yes Ojie, Jude, MD    Review of Systems  Constitutional: Positive for fatigue (very little). Negative for appetite change.  HENT: Negative for congestion, postnasal drip and sore throat.   Eyes: Negative.   Respiratory: Negative for cough, chest tightness and shortness of breath.   Cardiovascular: Negative for chest pain, palpitations and leg swelling.   Gastrointestinal: Negative for abdominal distention and abdominal pain.  Endocrine: Negative.   Genitourinary: Negative.   Musculoskeletal: Negative for back pain and neck pain.  Skin: Negative.   Allergic/Immunologic: Negative.   Neurological: Negative for dizziness and light-headedness.  Hematological: Negative for adenopathy. Does not bruise/bleed easily.  Psychiatric/Behavioral: Negative for dysphoric mood and sleep disturbance (sleeping on 1 pillow). The patient is not nervous/anxious.    Vitals:   07/13/19 1153  BP: 108/77  Pulse: 98  Resp: 18  SpO2: 100%  Weight: 178 lb (80.7 kg)  Height: 5\' 11"  (1.803 m)   Wt Readings from Last 3 Encounters:  07/13/19 178 lb (80.7 kg)  07/06/19 178 lb 1.6 oz (80.8 kg)  08/06/18 191 lb 8 oz (86.9 kg)   Lab Results  Component Value Date   CREATININE 1.10 07/06/2019   CREATININE 1.19 07/05/2019   CREATININE 0.92 07/04/2019    Physical Exam Vitals signs and nursing note reviewed.  Constitutional:      Appearance: He is well-developed.  HENT:     Head: Normocephalic and atraumatic.  Neck:     Musculoskeletal: Normal range of motion and neck supple.     Vascular: No JVD.  Cardiovascular:     Rate and Rhythm: Normal rate and regular rhythm.  Pulmonary:     Effort: Pulmonary effort is normal.     Breath sounds: Normal breath sounds. No wheezing or rales.  Abdominal:     General: There is no distension.     Palpations: Abdomen is soft.  Musculoskeletal:        General: No tenderness.  Skin:    General: Skin is warm and dry.  Neurological:     Mental Status: He is alert and oriented to person, place, and time.  Psychiatric:        Behavior: Behavior normal.        Thought Content: Thought content normal.    Assessment & Plan:  1: Chronic heart failure with reduced ejection fraction- - NYHA class II - euvolemic today - weighing daily and he was reminded to call for an overnight weight gain of >2 pounds or a weekly weight  gain of >5 pounds - weight stable from last time he was here ~ 1 month ago - not adding salt and trying to read food labels. Reminded to closely follow a 2000mg  diet  - has been walking ~ 20 minutes every morning - has become hypotensive with entresto in the past - unable to titrate metoprolol succinate due to history of bradycardia - will add farxiga 10mg  daily; instructed to stop taking his furosemide daily and to now take it as needed for above weight gain, swelling or shortness of breath - saw cardiology Brion Aliment) 08/06/18 - BNP 1701.0 on 07/03/2019  2: HTN- - BP on the low side today - seeing PCP @ [facility] & says that he has to make  an appointment with them - BMP 07/06/2019 reviewed and showed sodium 138, potassium 3.7, creatinine 1.10 and GFR >60.   Medication bottles were reviewed.  Return in 2 weeks or sooner for any questions/problems before then.

## 2019-07-11 LAB — CULTURE, BLOOD (SINGLE)
Culture: NO GROWTH
Special Requests: ADEQUATE

## 2019-07-13 ENCOUNTER — Other Ambulatory Visit: Payer: Self-pay

## 2019-07-13 ENCOUNTER — Ambulatory Visit: Payer: Medicare Other | Attending: Family | Admitting: Family

## 2019-07-13 ENCOUNTER — Encounter: Payer: Self-pay | Admitting: Family

## 2019-07-13 VITALS — BP 108/77 | HR 98 | Resp 18 | Ht 71.0 in | Wt 178.0 lb

## 2019-07-13 DIAGNOSIS — Z8249 Family history of ischemic heart disease and other diseases of the circulatory system: Secondary | ICD-10-CM | POA: Insufficient documentation

## 2019-07-13 DIAGNOSIS — I5022 Chronic systolic (congestive) heart failure: Secondary | ICD-10-CM | POA: Insufficient documentation

## 2019-07-13 DIAGNOSIS — I4819 Other persistent atrial fibrillation: Secondary | ICD-10-CM | POA: Diagnosis not present

## 2019-07-13 DIAGNOSIS — I110 Hypertensive heart disease with heart failure: Secondary | ICD-10-CM | POA: Insufficient documentation

## 2019-07-13 DIAGNOSIS — Z7901 Long term (current) use of anticoagulants: Secondary | ICD-10-CM | POA: Insufficient documentation

## 2019-07-13 DIAGNOSIS — I10 Essential (primary) hypertension: Secondary | ICD-10-CM

## 2019-07-13 DIAGNOSIS — Z79899 Other long term (current) drug therapy: Secondary | ICD-10-CM | POA: Insufficient documentation

## 2019-07-13 DIAGNOSIS — Z87891 Personal history of nicotine dependence: Secondary | ICD-10-CM | POA: Insufficient documentation

## 2019-07-13 MED ORDER — FARXIGA 10 MG PO TABS: 10.0000 mg | ORAL_TABLET | Freq: Every day | ORAL | 0 refills | Status: DC

## 2019-07-13 NOTE — Patient Instructions (Addendum)
Continue weighing daily and call for an overnight weight gain of > 2 pounds or a weekly weight gain of >5 pounds.  Begin Farxiga 10mg  once daily.   Take your furosemide (lasix) now as needed for above weight gain, swelling or shortness of breath.

## 2019-07-14 NOTE — Progress Notes (Signed)
Cardiology Office Note    Date:  07/17/2019   ID:  Donald Molina, DOB 12/24/1950, MRN [id number]  PCP:  Patient, No Pcp Per  Cardiologist:  Lorine BearsMuhammad Arida, MD  Electrophysiologist:  None   Chief Complaint: Hospital follow up  History of Present Illness:   Donald Blalockathaniel Schomer is a 68 y.o. male with history of HFrEF secondary to NICM, persistent A. fib on Eliquis, and HTN who presents for hospital follow-up after recent admission to [facility] from 8/7 through 8/10 for volume overload as managed by internal medicine.  Patient was admitted to the hospital and 04/2018 with a several week history of dyspnea found to be in A. fib.  He was markedly volume overloaded.  Echo at that time showed an EF of 25 to 30%.  Diagnostic cardiac cath showed normal coronary arteries.  Following diuresis and amiodarone loading, the patient underwent successful cardioversion.  When he initially followed up with our office in late 04/2018 he reported noncompliance with several of his medications including amiodarone and was noted to be back in A. fib.  Medications were resumed and when he was seen by the heart failure clinic in 05/2018 he was back in sinus rhythm.  Follow-up echo in 06/2018 showed improvement in LV systolic function with an EF of 45 to 50%, diffuse hypokinesis, grade 1 diastolic dysfunction, mildly dilated left atrium, normal RV systolic function and cavity size.  He was most recently seen in the office in 07/2018 and was doing well, maintaining sinus rhythm.  Given elevated BP, his losartan was titrated to 100 mg daily.  Weight was documented at 198.8 pounds but the patient stated he has been weighing 182 pounds on his home scale.  Baseline PFTs that showed mild airway obstruction felt to be in the setting of his prior smoking history.  More recently, the patient was admitted to the hospital from 8/7 through 8/10 with increased shortness of breath and lower extremity swelling for 2 days with associated fever of unknown  origin.  He was not consulted on by cardiology.  He was seen by internal medicine with echo showing an EF of 30 to 35%, mildly to moderately dilated left ventricle, diffuse hypokinesis, normal LV diastolic function, mild biatrial enlargement, mild to moderate mitral regurgitation, grossly normal aortic valve, normal in size and structure aorta.  Patient was diuresed with IV Lasix and noted improvement in symptoms.  One set of blood cultures grew staph species felt to be contaminant.  COVID-19 negative.  Prior to discharge, the patient was placed on empiric Levaquin given his fever of unknown origin.  High-sensitivity troponin initially 18 with a delta of 17.  EKG showed A. fib with RVR, 119 bpm.  Patient was continued on amiodarone, Eliquis, and metoprolol.  He was discharged on Lasix 40 mg daily.  Documented discharge weight of 80.8 kg.  Patient comes in feeling great today.  He denies any chest pain, shortness of breath, palpitations, dizziness, presyncope, or syncope.  No lower extremity swelling, abdominal tension, orthopnea, PND, early satiety.  No falls, BRBPR, or melena.  He indicates he had stopped taking his Lasix, losartan, and metoprolol leading up to his admission.  There was associated orthopnea and lower extremity swelling which are now resolved as outlined above.  He is not checking his blood pressure at home.  He reports compliance with Eliquis and denies missing any doses.  Overall, he does not have any issues or concerns today.   Labs: 06/2019 -WBC 9.9, Hgb 14.2, PLT 229,  potassium 3.7, serum creatinine 1.1, COVID-19 negative, influenza A/B negative, respiratory panel negative, BNP 1701, AST/ALT normal, albumin 4.5 04/2018 -total cholesterol 126, triglycerides 65, HDL 38, LDL 75, TSH normal   Past Medical History:  Diagnosis Date  . HFrEF (heart failure with reduced ejection fraction) ([facility])    a. 04/2018 Echo: EF 25-30%; b. 06/2018 Echo: EF 45-50%. diff HK, Gr1 DD. Nl RV fxn.  .  Hypertension   . NICM (nonischemic cardiomyopathy) ([facility])    a. 04/2018 Echo: EF 25-30%, diff HK, mildly dil Ao root, mod MR, mod dil LA, mildly to mod reduced RV fxn, mildly dil RA, PASP ; b. 04/2018 Cath: Nl cors. CO 2.98/CI 1.47; c. 06/2018 Echo: EF 45-50%. diff HK, Gr1 DD. Nl RV fxn.  . Persistent atrial fibrillation    a. Dx 04/2018 s/p DCCV and amio loading. CHA2DS2VASc 3-->eliquis.    Past Surgical History:  Procedure Laterality Date  . CARDIAC CATHETERIZATION    . RIGHT/LEFT HEART CATH AND CORONARY ANGIOGRAPHY N/A 05/05/2018   Procedure: RIGHT/LEFT HEART CATH AND CORONARY ANGIOGRAPHY;  Surgeon: Iran Ouch, MD;  Location: [facility];  Service: Cardiovascular;  Laterality: N/A;  . TEE WITHOUT CARDIOVERSION N/A 05/07/2018   Procedure: TRANSESOPHAGEAL ECHOCARDIOGRAM (TEE);  Surgeon: Yvonne Kendall, MD;  Location: [facility];  Service: Cardiovascular;  Laterality: N/A;    Current Medications: Current Meds  Medication Sig  . amiodarone (PACERONE) 200 MG tablet Take 1 tablet (200 mg total) by mouth daily.  Marland Kitchen apixaban (ELIQUIS) 5 MG TABS tablet Take 1 tablet (5 mg total) by mouth 2 (two) times daily.  . dapagliflozin propanediol (FARXIGA) 10 MG TABS tablet Take 10 mg by mouth daily before breakfast.  . furosemide (LASIX) 40 MG tablet TAKE 1 TABLET BY MOUTH EVERY DAY (Patient taking differently: Take 40 mg by mouth as needed for fluid or edema. )  . losartan (COZAAR) 50 MG tablet Take 100 mg by mouth daily.   . metoprolol succinate (TOPROL XL) 25 MG 24 hr tablet Take 1 tablet (25 mg total) by mouth daily.     Allergies:   Entresto [sacubitril-valsartan]   Social History   Socioeconomic History  . Marital status: Divorced    Spouse name: Not on file  . Number of children: 2  . Years of education: 50  . Highest education level: 12th grade  Occupational History  . Occupation: retired  Engineer, production  . Financial resource strain: Not hard at all  . Food insecurity     Worry: Never true    Inability: Never true  . Transportation needs    Medical: No    Non-medical: No  Tobacco Use  . Smoking status: Former Smoker    Years: 10.00    Quit date: 05/28/1998    Years since quitting: 21.1  . Smokeless tobacco: Former Neurosurgeon    Types: Chew    Quit date: 05/28/1998  Substance and Sexual Activity  . Alcohol use: Yes    Comment: occassional 1 beer  . Drug use: Never  . Sexual activity: Yes    Birth control/protection: Condom  Lifestyle  . Physical activity    Days per week: 7 days    Minutes per session: 20 min  . Stress: Not at all  Relationships  . Social Musician on phone: Not on file    Gets together: Not on file    Attends religious service: Not on file    Active member of club or organization:  Not on file    Attends meetings of clubs or organizations: Not on file    Relationship status: Not on file  Other Topics Concern  . Not on file  Social History Narrative  . Not on file     Family History:  The patient's family history includes Cancer in his mother; Cervical cancer in his mother; Dementia in his father; Heart disease in his sister; Heart failure in his father.  ROS:   Review of Systems  Constitutional: Negative for chills, diaphoresis, fever, malaise/fatigue and weight loss.  HENT: Negative for congestion.   Eyes: Negative for discharge and redness.  Respiratory: Negative for cough, hemoptysis, sputum production, shortness of breath and wheezing.   Cardiovascular: Negative for chest pain, palpitations, orthopnea, claudication, leg swelling and PND.  Gastrointestinal: Negative for abdominal pain, blood in stool, heartburn, melena, nausea and vomiting.  Genitourinary: Negative for hematuria.  Musculoskeletal: Negative for falls and myalgias.  Skin: Negative for rash.  Neurological: Negative for dizziness, tingling, tremors, sensory change, speech change, focal weakness, loss of consciousness and weakness.   Endo/Heme/Allergies: Does not bruise/bleed easily.  Psychiatric/Behavioral: Negative for substance abuse. The patient is not nervous/anxious.   All other systems reviewed and are negative.    EKGs/Labs/Other Studies Reviewed:    Studies reviewed were summarized above. The additional studies were reviewed today:  2D Echo 07/05/2019: 1. The left ventricle has moderate-severely reduced systolic function, with an ejection fraction of 30-35%. The cavity size was mild to moderately dilated. Findings are consistent with dilated cardiomyopathy. Left ventricular diastolic parameters were  normal. Left ventricular diffuse hypokinesis.  2. The right ventricle has moderately reduced systolic function. The cavity was moderately enlarged. There is no increase in right ventricular wall thickness.  3. Left atrial size was mildly dilated.  4. Right atrial size was mildly dilated.  5. The mitral valve is grossly normal. Mild thickening of the mitral valve leaflet. Mitral valve regurgitation is mild to moderate by color flow Doppler.  6. The aortic valve is grossly normal.  7. Mild valvular pulmonic stenosis.  8. The aorta is normal in size and structure.  9. The interatrial septum was not assessed. __________  [facility] 1.  Normal coronary arteries. 2.  Severely reduced LV systolic function by echo.  Left ventricular angiography was not performed. 3.  Right heart catheterization showed severely elevated filling pressures, moderate pulmonary hypertension and severely reduced cardiac output.  Cardiac output was 2.98 with a cardiac index of 1.47.  Recommendations: The patient has nonischemic cardiomyopathy likely tachycardia induced.  He continues to be significantly volume overloaded and I recommend continuing IV diuresis. Resume heparin drip 8 hours after sheath pull.  A DOAC can be started tomorrow.  I recommend TEE guided cardioversion before hospital discharge given degree of cardiomyopathy.  This  can likely be done on Wednesday once volume overload improves. I am going to start the patient on oral amiodarone to facilitate cardioversion.   EKG:  EKG is ordered today.  The EKG ordered today demonstrates A. fib, 99 bpm, left axis deviation, nonspecific ST-T changes  Recent Labs: 07/03/2019: ALT 16; B Natriuretic Peptide 1,701.0 07/06/2019: BUN 20; Creatinine, Ser 1.10; Hemoglobin 14.2; Platelets 229; Potassium 3.7; Sodium 138  Recent Lipid Panel    Component Value Date/Time   CHOL 126 05/04/2018 0138   TRIG 65 05/04/2018 0138   HDL 38 (L) 05/04/2018 0138   CHOLHDL 3.3 05/04/2018 0138   VLDL 13 05/04/2018 0138   LDLCALC 75 05/04/2018 0138  PHYSICAL EXAM:    VS:  BP (!) 142/89   Pulse 99   Ht 5\' 11"  (1.803 m)   Wt 176 lb 12.8 oz (80.2 kg)   BMI 24.66 kg/m   BMI: Body mass index is 24.66 kg/m.  Physical Exam  Constitutional: He is oriented to person, place, and time. He appears well-developed and well-nourished.  HENT:  Head: Normocephalic and atraumatic.  Eyes: Right eye exhibits no discharge. Left eye exhibits no discharge.  Neck: Normal range of motion. No JVD present.  Cardiovascular: Normal rate, S1 normal, S2 normal and normal heart sounds. An irregularly irregular rhythm present. Exam reveals no distant heart sounds, no friction rub, no midsystolic click and no opening snap.  No murmur heard. Pulses:      Posterior tibial pulses are 2+ on the right side and 2+ on the left side.  Pulmonary/Chest: Effort normal and breath sounds normal. No respiratory distress. He has no decreased breath sounds. He has no wheezes. He has no rales. He exhibits no tenderness.  Abdominal: Soft. He exhibits no distension. There is no abdominal tenderness.  Musculoskeletal:        General: No edema.  Neurological: He is alert and oriented to person, place, and time.  Skin: Skin is warm and dry. No cyanosis. Nails show no clubbing.  Psychiatric: He has a normal mood and affect. His speech  is normal and behavior is normal. Judgment and thought content normal.    Wt Readings from Last 3 Encounters:  07/17/19 176 lb 12.8 oz (80.2 kg)  07/13/19 178 lb (80.7 kg)  07/06/19 178 lb 1.6 oz (80.8 kg)     ASSESSMENT & PLAN:   1. HFrEF secondary to NICM: He is doing well, appears euvolemic and well compensated.  Patient's weight is down 15 pounds today when compared to his most recent office visit with [place] from 07/2018.  Continue current dose of Lasix and losartan.  Titrate Toprol-XL to 50 mg daily as outlined below.  Not currently on Entresto secondary to associated hypotension.  In follow-up, we should consider adding spironolactone to further optimize his regimen, especially in light of his cardiomyopathy.  Slight reduction in an EF certainly may have been precipitated by noncompliance with medications and A. fib with RVR.  CHF education discussed in detail.  2. Persistent A. Fib: Patient remains in A. fib with suboptimally controlled ventricular rate of 99 bpm.  Increase Toprol-XL to 50 mg twice daily.  Continue amiodarone 200 mg daily.  If he remains in A. fib and follow-up in 2 weeks would recommend reloading with amiodarone.  QTc relatively stable though will need to be further evaluated once out of A. fib.  Recent liver function normal.  Thyroid function from 2019 normal.  In follow-up recommended checking TSH.  Continue Eliquis 5 mg twice daily.  Denies any symptoms concerning for bleeding.  Recent CBC demonstrated stable hemoglobin.  3. HTN: Blood pressure is mildly elevated today at 142/89.  Increase Toprol-XL to 50 mg as outlined above.  Continue current dose of losartan and Lasix.  Disposition: F/u with Dr. Fletcher Anon or an APP in 2 weeks.   Medication Adjustments/Labs and Tests Ordered: Current medicines are reviewed at length with the patient today.  Concerns regarding medicines are outlined above. Medication changes, Labs and Tests ordered today are summarized above and listed in  the Patient Instructions accessible in Encounters.   Signed, Christell Faith, PA-C 07/17/2019 11:32 AM     [address]  [place], [place] [address] [phone]

## 2019-07-17 ENCOUNTER — Encounter: Payer: Self-pay | Admitting: Physician Assistant

## 2019-07-17 ENCOUNTER — Telehealth: Payer: Self-pay

## 2019-07-17 ENCOUNTER — Ambulatory Visit (INDEPENDENT_AMBULATORY_CARE_PROVIDER_SITE_OTHER): Payer: Medicare Other | Admitting: Physician Assistant

## 2019-07-17 ENCOUNTER — Other Ambulatory Visit: Payer: Self-pay

## 2019-07-17 ENCOUNTER — Other Ambulatory Visit
Admission: RE | Admit: 2019-07-17 | Discharge: 2019-07-17 | Disposition: A | Discharge status: Home / Self Care | Payer: Medicare Other | Source: Ambulatory Visit | Attending: Physician Assistant | Admitting: Physician Assistant

## 2019-07-17 VITALS — BP 142/89 | HR 99 | Ht 71.0 in | Wt 176.8 lb

## 2019-07-17 DIAGNOSIS — I428 Other cardiomyopathies: Secondary | ICD-10-CM

## 2019-07-17 DIAGNOSIS — I5022 Chronic systolic (congestive) heart failure: Secondary | ICD-10-CM | POA: Diagnosis not present

## 2019-07-17 DIAGNOSIS — I4819 Other persistent atrial fibrillation: Secondary | ICD-10-CM

## 2019-07-17 DIAGNOSIS — I10 Essential (primary) hypertension: Secondary | ICD-10-CM

## 2019-07-17 LAB — BASIC METABOLIC PANEL
Anion gap: 6 (ref 5–15)
BUN: 12 mg/dL (ref 8–23)
CO2: 24 mmol/L (ref 22–32)
Calcium: 8.9 mg/dL (ref 8.9–10.3)
Chloride: 106 mmol/L (ref 98–111)
Creatinine, Ser: 0.98 mg/dL (ref 0.61–1.24)
GFR calc Af Amer: >60 mL/min (ref >60)
GFR calc non Af Amer: >60 mL/min (ref >60)
Glucose, Bld: 94 mg/dL (ref 70–99)
Potassium: 3.5 mmol/L (ref 3.5–5.1)
Sodium: 136 mmol/L (ref 135–145)

## 2019-07-17 MED ORDER — METOPROLOL SUCCINATE ER 50 MG PO TB24: 50.0000 mg | ORAL_TABLET | Freq: Every day | ORAL | 11 refills | Status: DC

## 2019-07-17 NOTE — Telephone Encounter (Signed)
Call attempted. NA/NV 

## 2019-07-17 NOTE — Patient Instructions (Signed)
Medication Instructions:  1- INCREASE Toprol XL to Take 1 tablet (50 mg total) by mouth daily. If you need a refill on your cardiac medications before your next appointment, please call your pharmacy.   Lab work: Your physician recommends that you return for lab work TODAY at the medical mall. No appt is needed. Hours are M-F 7AM- 6 PM.  If you have labs (blood work) drawn today and your tests are completely normal, you will receive your results only by: Marland Kitchen MyChart Message (if you have MyChart) OR . A paper copy in the mail If you have any lab test that is abnormal or we need to change your treatment, we will call you to review the results.  Testing/Procedures: None ordered   Follow-Up: At [facility], you and your health needs are our priority.  As part of our continuing mission to provide you with exceptional heart care, we have created designated Provider Care Teams.  These Care Teams include your primary Cardiologist (physician) and Advanced Practice Providers (APPs -  Physician Assistants and Nurse Practitioners) who all work together to provide you with the care you need, when you need it. You will need a follow up appointment in 2 weeks. You may see Kathlyn Sacramento, MD or Christell Faith, PA-C.

## 2019-07-17 NOTE — Telephone Encounter (Signed)
-----   Message from Rise Mu, PA-C sent at 07/17/2019 12:45 PM EDT ----- Labs showed stable/normal renal function.  Please have the patient start spironolactone 1 week prior to his upcoming appointment.  Follow-up as planned in 2 weeks, we will obtain follow-up BMP after starting spironolactone at that visit.

## 2019-07-21 NOTE — Addendum Note (Signed)
Addended by: Anselm Pancoast on: 07/21/2019 08:33 AM   Modules accepted: Orders

## 2019-07-22 NOTE — Telephone Encounter (Signed)
Call attempted. NA/NV 

## 2019-07-22 NOTE — Progress Notes (Deleted)
Patient ID: Donald Molina, male    DOB: July 14, 1951, 68 y.o.   MRN: [id number]  HPI  Donald Molina is a 68 y/o male with a history of atrial fibrillation, HTN, remote tobacco use and chronic heart failure.   Echo report from 07/03/2019 reviewed and showed an EF of 30-35% along with mild/moderate Donald. TEE and cardioversion report from 05/07/18 reviewed and showed an EF of 20-25% along with trivial AR, mild/mod Donald & TR and small pleural effusion. Echo report from 05/03/18 reviewed which showed an EF of 25-30% along with moderate Donald/TR and moderately elevated PA pressure of 52 mm Hg.   RHC/ LHC done 05/05/18 showed normal coronary arteries, severely elevated filling pressures, moderate pulmonary HTN and severely reduced cardiac output. CO was 2.98 with cardiac index of 1.47  Admitted 07/03/2019 due to acute on chronic heart failure. Initially given IV lasix with transition to oral diuretics. Discharged after 3 days.   Donald Molina presents today for a follow-up visit with a chief complaint of   Past Medical History:  Diagnosis Date  . HFrEF (heart failure with reduced ejection fraction) ([place])    a. 04/2018 Echo: EF 25-30%; b. 06/2018 Echo: EF 45-50%. diff HK, Gr1 DD. Nl RV fxn.  . Hypertension   . NICM (nonischemic cardiomyopathy) ([place])    a. 04/2018 Echo: EF 25-30%, diff HK, mildly dil Ao root, mod Donald, mod dil LA, mildly to mod reduced RV fxn, mildly dil RA, PASP 62mmHg; b. 04/2018 Cath: Nl cors. CO 2.98/CI 1.47; c. 06/2018 Echo: EF 45-50%. diff HK, Gr1 DD. Nl RV fxn.  . Persistent atrial fibrillation    a. Dx 04/2018 s/p DCCV and amio loading. CHA2DS2VASc 3-->eliquis.   Past Surgical History:  Procedure Laterality Date  . CARDIAC CATHETERIZATION    . RIGHT/LEFT HEART CATH AND CORONARY ANGIOGRAPHY N/A 05/05/2018   Procedure: RIGHT/LEFT HEART CATH AND CORONARY ANGIOGRAPHY;  Surgeon: Wellington Hampshire, MD;  Location: [facility];  Service: Cardiovascular;  Laterality: N/A;  . TEE WITHOUT CARDIOVERSION N/A  05/07/2018   Procedure: TRANSESOPHAGEAL ECHOCARDIOGRAM (TEE);  Surgeon: Nelva Bush, MD;  Location: [facility];  Service: Cardiovascular;  Laterality: N/A;   Family History  Problem Relation Age of Onset  . Cancer Mother        pancreatic  . Cervical cancer Mother   . Dementia Father   . Heart failure Father   . Heart disease Sister    Social History   Tobacco Use  . Smoking status: Former Smoker    Years: 10.00    Quit date: 05/28/1998    Years since quitting: 21.1  . Smokeless tobacco: Former Systems developer    Types: [place] date: 05/28/1998  Substance Use Topics  . Alcohol use: Yes    Comment: occassional 1 beer   Allergies  Allergen Reactions  . Entresto [Sacubitril-Valsartan] Other (See Comments)    hypotension      Review of Systems  Constitutional: Positive for fatigue (very little). Negative for appetite change.  HENT: Negative for congestion, postnasal drip and sore throat.   Eyes: Negative.   Respiratory: Negative for cough, chest tightness and shortness of breath.   Cardiovascular: Negative for chest pain, palpitations and leg swelling.  Gastrointestinal: Negative for abdominal distention and abdominal pain.  Endocrine: Negative.   Genitourinary: Negative.   Musculoskeletal: Negative for back pain and neck pain.  Skin: Negative.   Allergic/Immunologic: Negative.   Neurological: Negative for dizziness and light-headedness.  Hematological: Negative for adenopathy.  Does not bruise/bleed easily.  Psychiatric/Behavioral: Negative for dysphoric mood and sleep disturbance (sleeping on 1 pillow). The patient is not nervous/anxious.     Physical Exam Vitals signs and nursing note reviewed.  Constitutional:      Appearance: Donald Molina is well-developed.  HENT:     Head: Normocephalic and atraumatic.  Neck:     Musculoskeletal: Normal range of motion and neck supple.     Vascular: No JVD.  Cardiovascular:     Rate and Rhythm: Normal rate and regular rhythm.  Pulmonary:      Effort: Pulmonary effort is normal.     Breath sounds: Normal breath sounds. No wheezing or rales.  Abdominal:     General: There is no distension.     Palpations: Abdomen is soft.  Musculoskeletal:        General: No tenderness.  Skin:    General: Skin is warm and dry.  Neurological:     Mental Status: Donald Molina is alert and oriented to person, place, and time.  Psychiatric:        Behavior: Behavior normal.        Thought Content: Thought content normal.    Assessment & Plan:  1: Chronic heart failure with reduced ejection fraction- - NYHA class II - euvolemic today - weighing daily and Donald Molina was reminded to call for an overnight weight gain of >2 pounds or a weekly weight gain of >5 pounds - weight stable from last time Donald Molina was here ~ 1 month ago - not adding salt and trying to read food labels. Reminded to closely follow a 2000mg  diet  - has been walking ~ 20 minutes every morning - has become hypotensive with entresto in the past - unable to titrate metoprolol succinate due to history of bradycardia - will add farxiga 10mg  daily; instructed to stop taking his furosemide daily and to now take it as needed for above weight gain, swelling or shortness of breath - saw cardiology ([place]) 07/17/19 - BNP 1701.0 on 07/03/2019  2: HTN- - BP on the low side today - seeing PCP @ [facility] & says that Donald Molina has to make an appointment with them - BMP 07/17/2019 reviewed and showed sodium 136, potassium 3.5, creatinine 0.98 and GFR >60.   Medication bottles were reviewed.  Return in 2 weeks or sooner for any questions/problems before then.

## 2019-07-23 NOTE — Telephone Encounter (Signed)
3rd attempt. I will mail letter to patient.

## 2019-07-27 ENCOUNTER — Telehealth: Payer: Self-pay | Admitting: Family

## 2019-07-27 ENCOUNTER — Ambulatory Visit: Payer: Medicare Other | Admitting: Family

## 2019-07-27 NOTE — Telephone Encounter (Signed)
Patient did not show for his Heart Failure Clinic appointment on 07/27/2019. Will attempt to reschedule.  

## 2019-07-27 NOTE — Progress Notes (Deleted)
Cardiology Office Note    Date:  07/27/2019   ID:  Donald Molina, DOB 1951-07-30, MRN [id number]  PCP:  Patient, No Pcp Per  Cardiologist:  Lorine Bears, MD  Electrophysiologist:  None   Chief Complaint: Follow up  History of Present Illness:   Donald Molina is a 68 y.o. male with history of HFrEF secondary to NICM, persistent A. fib on Eliquis, and HTN who presents for follow up of his cardiomyopathy.  Patient was admitted to the hospital and 04/2018 with a several week history of dyspnea found to be in A. fib.  He was markedly volume overloaded.  Echo at that time showed an EF of 25 to 30%.  Diagnostic cardiac cath showed normal coronary arteries.  Following diuresis and amiodarone loading, the patient underwent successful cardioversion.  When he initially followed up with our office in late 04/2018 he reported noncompliance with several of his medications including amiodarone and was noted to be back in A. fib.  Medications were resumed and when he was seen by the heart failure clinic in 05/2018 he was back in sinus rhythm.  Follow-up echo in 06/2018 showed improvement in LV systolic function with an EF of 45 to 50%, diffuse hypokinesis, grade 1 diastolic dysfunction, mildly dilated left atrium, normal RV systolic function and cavity size.  He was seen in the office in 07/2018 and was doing well, maintaining sinus rhythm.  Given elevated BP, his losartan was titrated to 100 mg daily.  Weight was documented at 198.8 pounds but the patient stated he has been weighing 182 pounds on his home scale.  Baseline PFTs that showed mild airway obstruction felt to be in the setting of his prior smoking history.  More recently, the patient was admitted to the hospital in 06/2019 with increased shortness of breath and lower extremity swelling for 2 days with associated fever of unknown origin.  He had stopped several medications, including Lasix prior to his admission.  He was not consulted on by cardiology.   Echo showed an EF of 30 to 35%, mildly to moderately dilated left ventricle, diffuse hypokinesis, normal LV diastolic function, mild biatrial enlargement, mild to moderate mitral regurgitation, grossly normal aortic valve, normal in size and structure aorta.  Patient was diuresed with IV Lasix and noted improvement in symptoms.  One set of blood cultures grew staph species felt to be contaminant.  COVID-19 negative.  Prior to discharge, the patient was placed on empiric Levaquin given his fever of unknown origin.  High-sensitivity troponin initially 18 with a delta of 17.  EKG showed A. fib with RVR, 119 bpm.  Patient was continued on amiodarone, Eliquis, and metoprolol.  He was discharged on Lasix 40 mg daily.  Documented discharge weight of 80.8 kg.  He was seen in hospital follow up on 8/21 and was "feeling great."  He remained in Afib with ventricular rate in the 90s bpm.  His Toprol was increased to 50 mg daily.  With follow up labs showing normal renal function and a potassium of 3.5, it was recommended the patient start spironolactone 1 week prior to his appointment today.   ***   Labs: 06/2019 - SCr 0.98, K+ 3.5 06/2019 -WBC 9.9, Hgb 14.2, PLT 229, BNP 1701, AST/ALT normal, albumin 4.5 04/2018 -total cholesterol 126, triglycerides 65, HDL 38, LDL 75, TSH normal  Past Medical History:  Diagnosis Date   HFrEF (heart failure with reduced ejection fraction) ([facility])    a. 04/2018 Echo: EF 25-30%; b. 06/2018  Echo: EF 45-50%. diff HK, Gr1 DD. Nl RV fxn.   Hypertension    NICM (nonischemic cardiomyopathy) ([place])    a. 04/2018 Echo: EF 25-30%, diff HK, mildly dil Ao root, mod MR, mod dil LA, mildly to mod reduced RV fxn, mildly dil RA, PASP 75mmHg; b. 04/2018 Cath: Nl cors. CO 2.98/CI 1.47; c. 06/2018 Echo: EF 45-50%. diff HK, Gr1 DD. Nl RV fxn.   Persistent atrial fibrillation    a. Dx 04/2018 s/p DCCV and amio loading. CHA2DS2VASc 3-->eliquis.    Past Surgical History:  Procedure Laterality Date     CARDIAC CATHETERIZATION     RIGHT/LEFT HEART CATH AND CORONARY ANGIOGRAPHY N/A 05/05/2018   Procedure: RIGHT/LEFT HEART CATH AND CORONARY ANGIOGRAPHY;  Surgeon: Donald Hampshire, MD;  Location: [place] [facility];  Service: Cardiovascular;  Laterality: N/A;   TEE WITHOUT CARDIOVERSION N/A 05/07/2018   Procedure: TRANSESOPHAGEAL ECHOCARDIOGRAM (TEE);  Surgeon: Donald Bush, MD;  Location: [facility];  Service: Cardiovascular;  Laterality: N/A;    Current Medications: No outpatient medications have been marked as taking for the 07/31/19 encounter (Appointment) with Donald Molina, Donald Molina.    Allergies:   Entresto [sacubitril-valsartan]   Social History   Socioeconomic History   Marital status: Divorced    Spouse name: Not on file   Number of children: 2   Years of education: 12   Highest education level: 12th grade  Occupational History   Occupation: retired  Scientist, product/process development strain: Not hard at all   Food insecurity    Worry: Never true    Inability: Never true   Transportation needs    Medical: No    Non-medical: No  Tobacco Use   Smoking status: Former Smoker    Years: 10.00    Quit date: 05/28/1998    Years since quitting: 21.1   Smokeless tobacco: Former Systems developer    Types: Chew    Quit date: 05/28/1998  Substance and Sexual Activity   Alcohol use: Yes    Comment: occassional 1 beer   Drug use: Never   Sexual activity: Yes    Birth control/protection: Condom  Lifestyle   Physical activity    Days per week: 7 days    Minutes per session: 20 min   Stress: Not at all  Relationships   Social connections    Talks on phone: Not on file    Gets together: Not on file    Attends religious service: Not on file    Active member of club or organization: Not on file    Attends meetings of clubs or organizations: Not on file    Relationship status: Not on file  Other Topics Concern   Not on file  Social History Narrative   Not on file      Family History:  The patient's family history includes Cancer in his mother; Cervical cancer in his mother; Dementia in his father; Heart disease in his sister; Heart failure in his father.  ROS:   ROS   EKGs/Labs/Other Studies Reviewed:    Studies reviewed were summarized above. The additional studies were reviewed today:  2D Echo 07/05/2019: 1. The left ventricle has moderate-severely reduced systolic function, with an ejection fraction of 30-35%. The cavity size was mild to moderately dilated. Findings are consistent with dilated cardiomyopathy. Left ventricular diastolic parameters were  normal. Left ventricular diffuse hypokinesis. 2. The right ventricle has moderately reduced systolic function. The cavity was moderately enlarged. There is no increase  in right ventricular wall thickness. 3. Left atrial size was mildly dilated. 4. Right atrial size was mildly dilated. 5. The mitral valve is grossly normal. Mild thickening of the mitral valve leaflet. Mitral valve regurgitation is mild to moderate by color flow Doppler. 6. The aortic valve is grossly normal. 7. Mild valvular pulmonic stenosis. 8. The aorta is normal in size and structure. 9. The interatrial septum was not assessed. __________  Donald Molina 04/2018: 1. Normal coronary arteries. 2. Severely reduced LV systolic function by echo. Left ventricular angiography was not performed. 3. Right heart catheterization showed severely elevated filling pressures, moderate pulmonary hypertension and severely reduced cardiac output. Cardiac output was 2.98 with a cardiac index of 1.47.  Recommendations: The patient has nonischemic cardiomyopathy likely tachycardia induced. He continues to be significantly volume overloaded and I recommend continuing IV diuresis. Resume heparin drip 8 hours after sheath pull. A DOAC can be started tomorrow.  I recommend TEE guided cardioversion before hospital discharge given degree of  cardiomyopathy. This can likely be done on Wednesday once volume overload improves. I am going to start the patient on oral amiodarone to facilitate cardioversion.   EKG:  EKG is ordered today.  The EKG ordered today demonstrates ***  Recent Labs: 07/03/2019: ALT 16; B Natriuretic Peptide 1,701.0 07/06/2019: Hemoglobin 14.2; Platelets 229 07/17/2019: BUN 12; Creatinine, Ser 0.98; Potassium 3.5; Sodium 136  Recent Lipid Panel    Component Value Date/Time   CHOL 126 05/04/2018 0138   TRIG 65 05/04/2018 0138   HDL 38 (L) 05/04/2018 0138   CHOLHDL 3.3 05/04/2018 0138   VLDL 13 05/04/2018 0138   LDLCALC 75 05/04/2018 0138    PHYSICAL EXAM:    VS:  There were no vitals taken for this visit.  BMI: There is no height or weight on file to calculate BMI.  Physical Exam  Wt Readings from Last 3 Encounters:  07/17/19 176 lb 12.8 oz (80.2 kg)  07/13/19 178 lb (80.7 kg)  07/06/19 178 lb 1.6 oz (80.8 kg)     ASSESSMENT & PLAN:   1. ***  Disposition: F/u with Dr. Kirke CorinArida or an APP in ***.   Medication Adjustments/Labs and Tests Ordered: Current medicines are reviewed at length with the patient today.  Concerns regarding medicines are outlined above. Medication changes, Labs and Tests ordered today are summarized above and listed in the Patient Instructions accessible in Encounters.   Signed, Donald Listenyan Lodie Waheed, Donald Molina 07/27/2019 2:05 PM     [facility] [place] [address] [place], [place] [address] [phone]

## 2019-07-31 ENCOUNTER — Ambulatory Visit: Payer: Medicare Other | Admitting: Physician Assistant

## 2019-08-12 DIAGNOSIS — Z0000 Encounter for general adult medical examination without abnormal findings: Secondary | ICD-10-CM | POA: Diagnosis not present

## 2019-10-02 ENCOUNTER — Other Ambulatory Visit: Payer: Self-pay | Admitting: *Deleted

## 2019-10-02 ENCOUNTER — Other Ambulatory Visit: Payer: Self-pay | Admitting: Cardiovascular Disease

## 2019-10-02 MED ORDER — LOSARTAN POTASSIUM 50 MG PO TABS: 100.0000 mg | ORAL_TABLET | Freq: Every day | ORAL | 0 refills | Status: DC

## 2019-10-02 NOTE — Telephone Encounter (Signed)
Please schedule F/U appointment. Thank you! 

## 2019-10-20 ENCOUNTER — Other Ambulatory Visit: Payer: Self-pay | Admitting: Cardiovascular Disease

## 2019-10-20 ENCOUNTER — Other Ambulatory Visit: Payer: Self-pay | Admitting: Physician Assistant

## 2019-10-26 NOTE — Telephone Encounter (Signed)
LM to call office last visit no show.

## 2019-10-30 ENCOUNTER — Other Ambulatory Visit: Payer: Self-pay | Admitting: Cardiovascular Disease

## 2019-11-02 DIAGNOSIS — I509 Heart failure, unspecified: Secondary | ICD-10-CM | POA: Diagnosis not present

## 2019-11-02 DIAGNOSIS — I480 Paroxysmal atrial fibrillation: Secondary | ICD-10-CM | POA: Diagnosis not present

## 2019-11-02 DIAGNOSIS — R7989 Other specified abnormal findings of blood chemistry: Secondary | ICD-10-CM | POA: Diagnosis not present

## 2019-11-02 DIAGNOSIS — I429 Cardiomyopathy, unspecified: Secondary | ICD-10-CM | POA: Diagnosis not present

## 2019-11-02 DIAGNOSIS — I10 Essential (primary) hypertension: Secondary | ICD-10-CM | POA: Diagnosis not present

## 2019-11-02 DIAGNOSIS — Z1159 Encounter for screening for other viral diseases: Secondary | ICD-10-CM | POA: Diagnosis not present

## 2019-11-02 DIAGNOSIS — Z23 Encounter for immunization: Secondary | ICD-10-CM | POA: Diagnosis not present

## 2019-11-12 NOTE — Telephone Encounter (Signed)
Attempted to schedule.  

## 2019-11-16 NOTE — Telephone Encounter (Signed)
Lm to call office. 3 attempts to schedule fu appt from recall list.   Deleting recall.

## 2019-11-18 ENCOUNTER — Telehealth: Payer: Self-pay

## 2019-11-18 MED ORDER — LOSARTAN POTASSIUM 50 MG PO TABS: 100.0000 mg | ORAL_TABLET | Freq: Every day | ORAL | 0 refills | Status: DC

## 2019-11-18 NOTE — Telephone Encounter (Signed)
Requested Prescriptions   Signed Prescriptions Disp Refills  . losartan (COZAAR) 50 MG tablet 30 tablet 0    Sig: Take 2 tablets (100 mg total) by mouth daily. Take 100 mg by mouth daily. *NEEDS OFFICE VISIT FOR FURTHER REFILLS* 3RD and FINAL ATTEMPT    Authorizing Provider: Murray Hodgkins RONALD    Ordering User: NEWCOMER MCCLAIN, Ronica Vivian L

## 2019-11-19 DIAGNOSIS — R7989 Other specified abnormal findings of blood chemistry: Secondary | ICD-10-CM | POA: Diagnosis not present

## 2019-11-19 DIAGNOSIS — R945 Abnormal results of liver function studies: Secondary | ICD-10-CM | POA: Diagnosis not present

## 2019-11-19 DIAGNOSIS — K824 Cholesterolosis of gallbladder: Secondary | ICD-10-CM | POA: Diagnosis not present

## 2019-11-23 ENCOUNTER — Other Ambulatory Visit: Payer: Self-pay

## 2019-11-23 MED ORDER — LOSARTAN POTASSIUM 50 MG PO TABS: 100.0000 mg | ORAL_TABLET | Freq: Every day | ORAL | 0 refills | Status: DC

## 2019-11-24 ENCOUNTER — Telehealth: Payer: Self-pay | Admitting: Cardiovascular Disease

## 2019-11-24 NOTE — Telephone Encounter (Signed)
-----   Message from Janan Ridge, [place] sent at 11/23/2019  4:37 PM EST ----- Regarding: Needs Appt. Patient needs an appointment for further refills. If patient does not want to schedule an appointment please make them aware to contact PCP for refills. I have sent in enough medication until appointment can be made.   Thanks Ladies!

## 2019-11-24 NOTE — Telephone Encounter (Signed)
Called patient, no voicemail was available

## 2019-12-01 ENCOUNTER — Other Ambulatory Visit: Payer: Self-pay

## 2019-12-03 NOTE — Telephone Encounter (Signed)
No ans no vm   °

## 2019-12-23 NOTE — Telephone Encounter (Signed)
l mom to schedule appointment 

## 2020-01-08 NOTE — Telephone Encounter (Signed)
3 attempts to schedule fu appt  .  Closing encounter.   

## 2020-04-01 ENCOUNTER — Other Ambulatory Visit: Payer: Self-pay | Admitting: Physician Assistant

## 2020-05-16 IMAGING — CR DG CHEST 2V
1 series · 2 of 2 positions shown · non-contrast
Comparison: 10/04/2011 chest radiograph.

CLINICAL DATA: New dyspnea

EXAM:
CHEST - 2 VIEW

[Series 1: dg chest 2 view · 0.14mm/px · 2 of 2 slices shown]
[im 1/2]
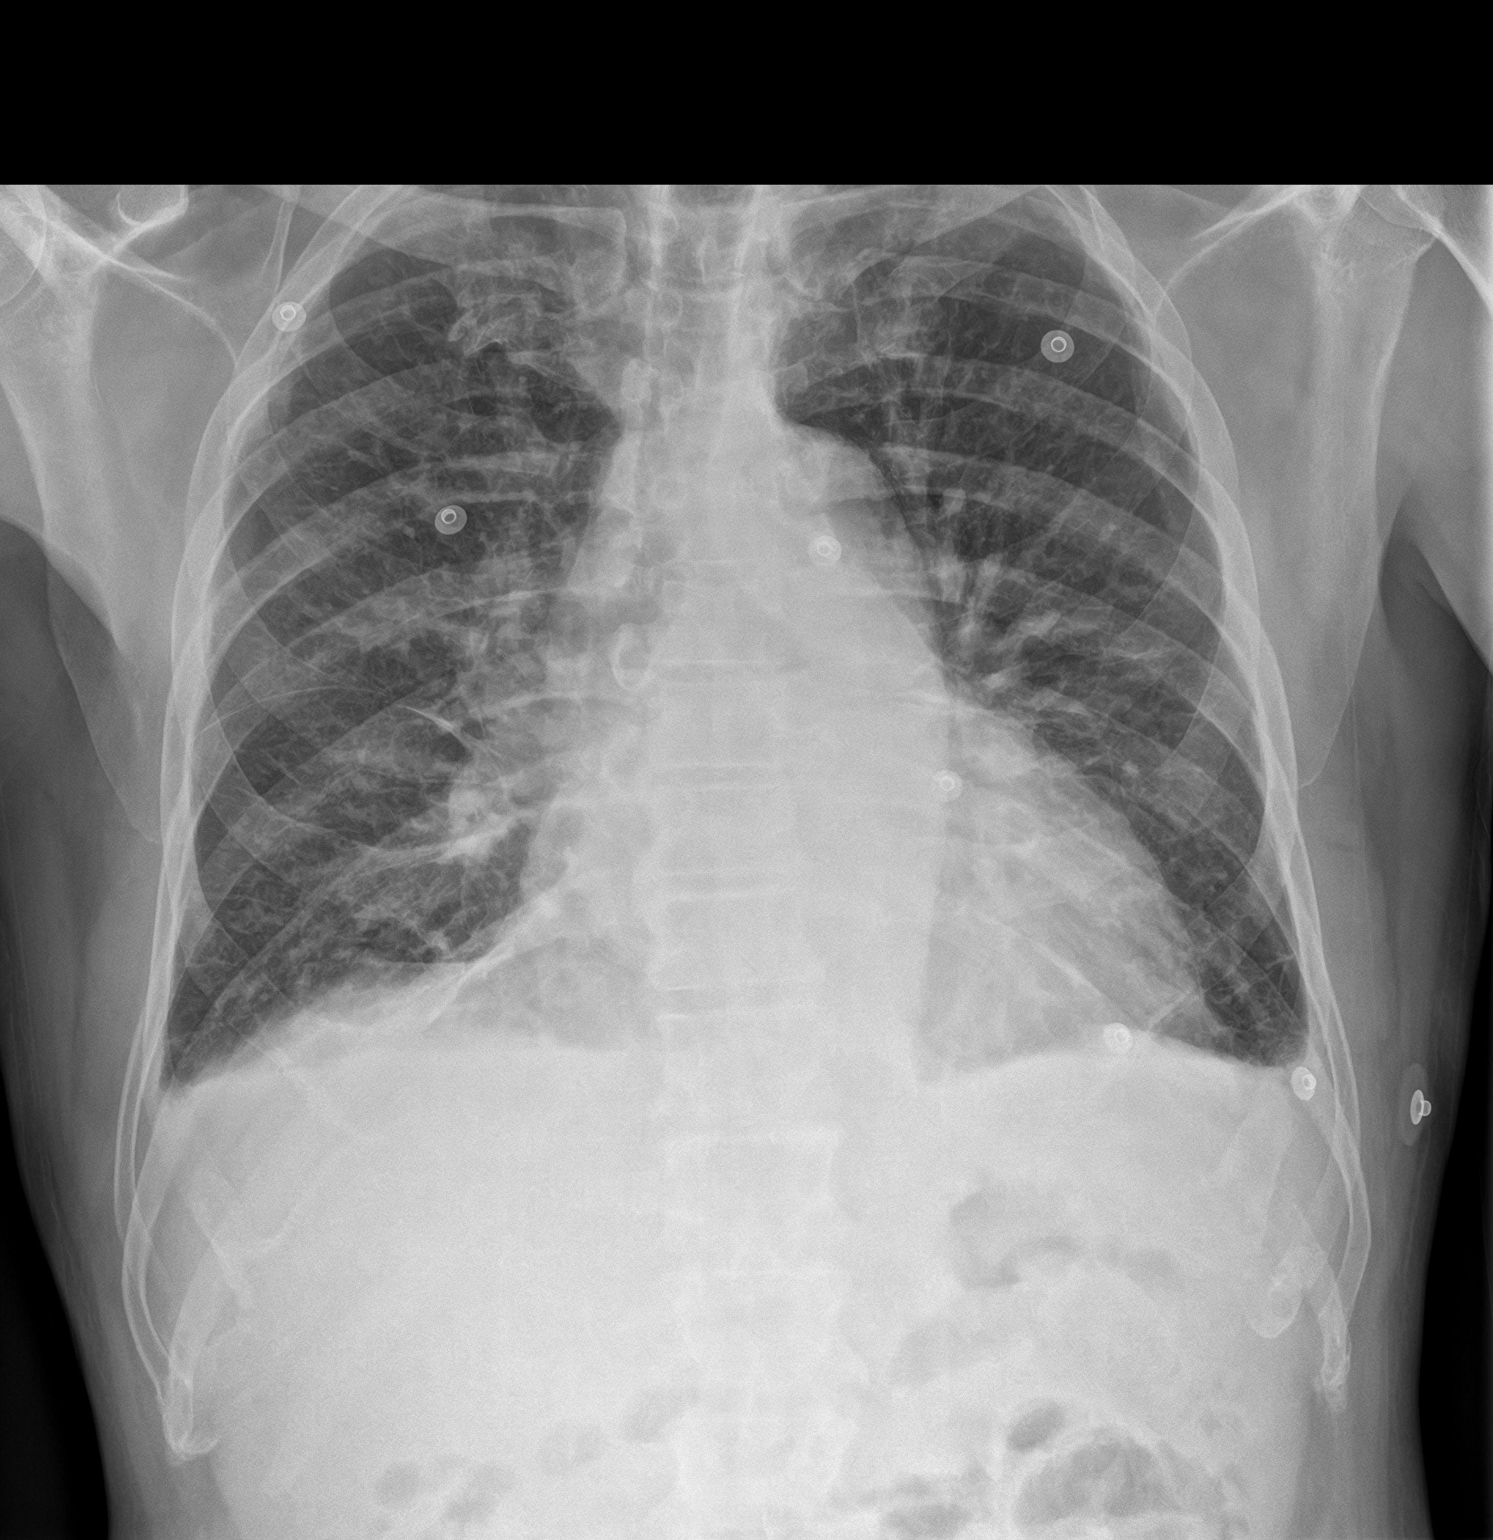
[im 2/2]
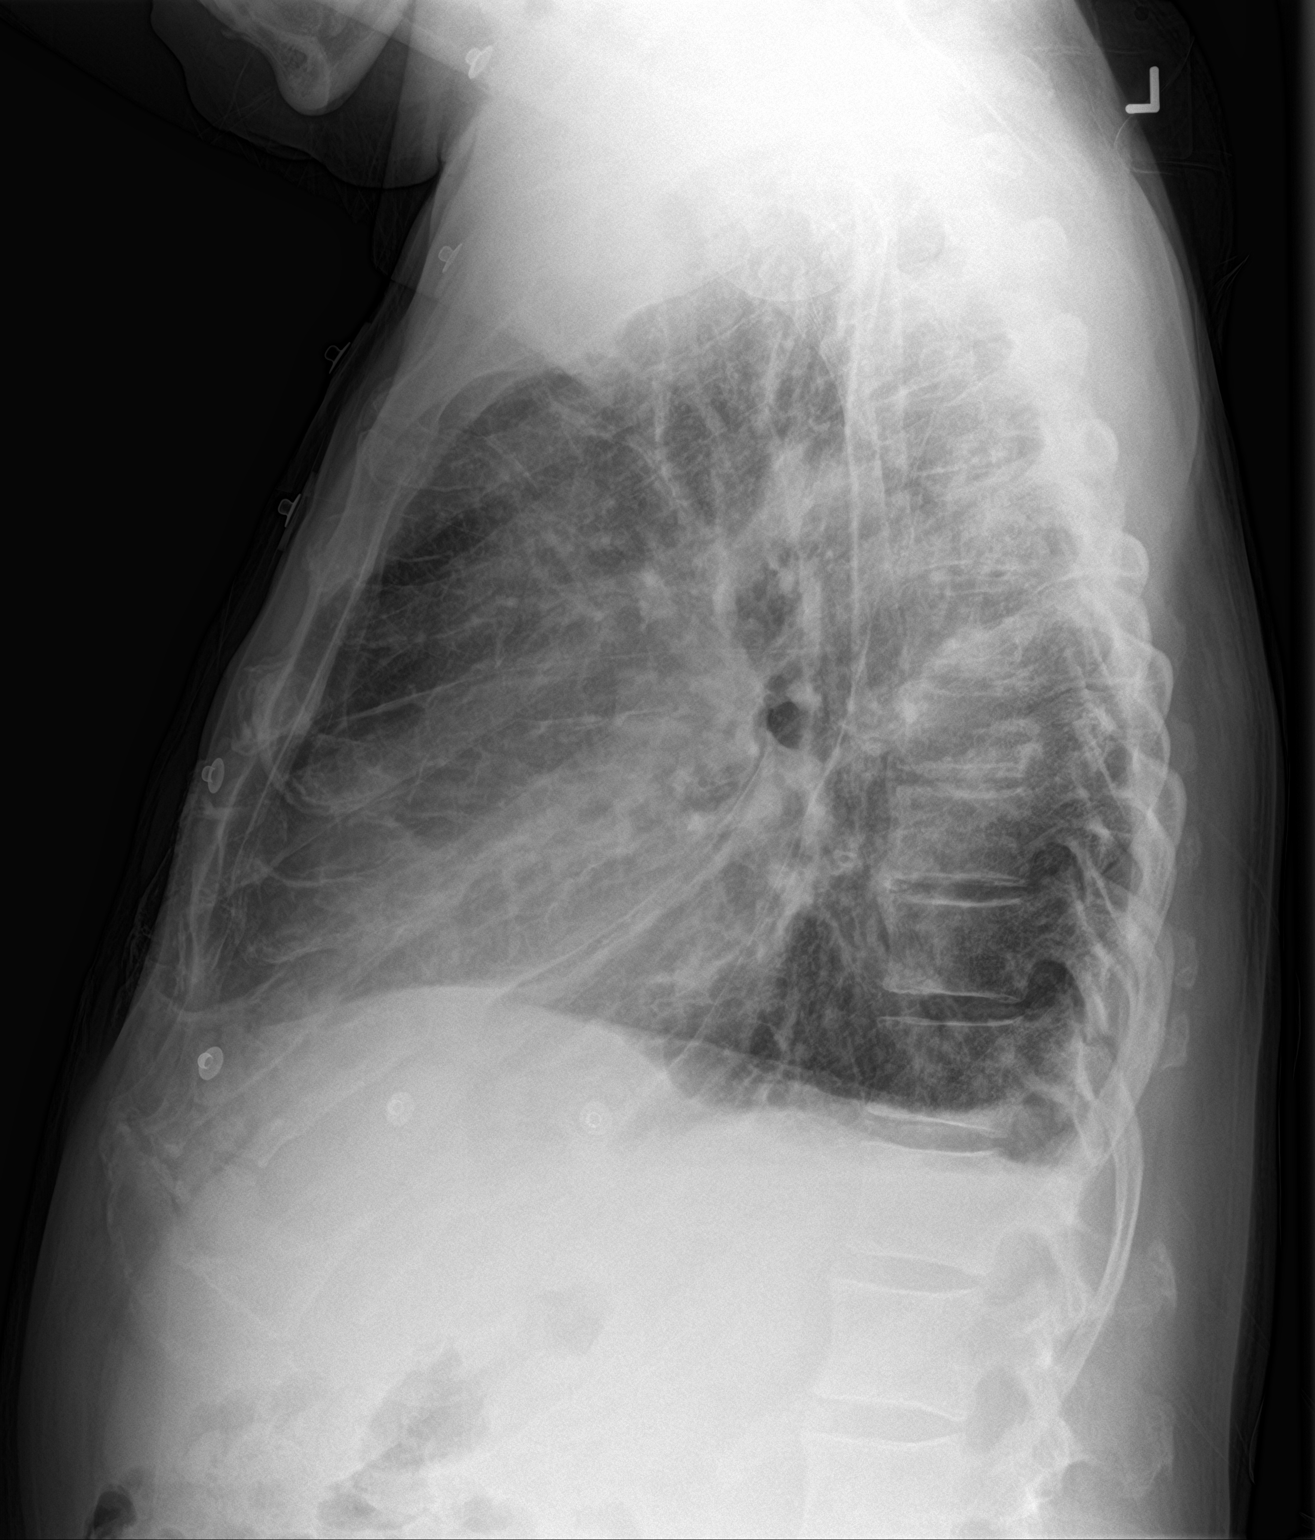

[2 of 2 positions shown; findings below may reference images not displayed]

FINDINGS: Stable cardiomediastinal silhouette with mild cardiomegaly. No
pneumothorax. Hyperinflated lungs. Small bilateral pleural
effusions. Mild pulmonary edema. No acute consolidative airspace
disease.
IMPRESSION: 1. Mild congestive heart failure with small bilateral pleural
effusions.
2. Hyperinflated lungs, cannot exclude COPD.

## 2020-06-16 ENCOUNTER — Other Ambulatory Visit: Payer: Self-pay | Admitting: Physician Assistant

## 2020-06-16 NOTE — Telephone Encounter (Signed)
Called patient, unable to leave message on either number listed

## 2020-06-16 NOTE — Telephone Encounter (Signed)
Pt OD 2 wk f/u. Please contact pt for future appointment.

## 2020-07-13 NOTE — Telephone Encounter (Signed)
Attempted to schedule no ans no vm  

## 2020-07-27 NOTE — Telephone Encounter (Signed)
Attempted to schedule no ans no vm  

## 2020-08-02 NOTE — Addendum Note (Signed)
Addended by: Kendrick Fries on: 08/02/2020 10:13 AM   Modules accepted: Orders

## 2020-08-02 NOTE — Telephone Encounter (Signed)
Was told to call back in 10 minutes, pt is walking outside now

## 2020-08-02 NOTE — Progress Notes (Deleted)
Cardiology Office Note    Date:  08/02/2020   ID:  Donald Molina, DOB [birth year], MRN [id number]  PCP:  Patient, No Pcp Per  Cardiologist:  Lorine Bears, MD  Electrophysiologist:  None   Chief Complaint: Follow up  History of Present Illness:   Donald Molina is a 69 y.o. male with history of HFrEF secondary to NICM, persistent A. fib on Eliquis, and HTN who presents for follow up of his cardiomyopathy.  He was admitted to the hospital and 04/2018 with a several week history of dyspnea and found to be in A. fib.  He was markedly volume overloaded.  Echo at that time showed an EF of 25 to 30%.  Diagnostic cardiac cath showed normal coronary arteries.  Following diuresis and amiodarone loading, he underwent successful cardioversion.  When he initially followed up with our office in late 04/2018 he reported noncompliance with several of his medications including amiodarone and was noted to be back in A. fib.  Medications were resumed and when he was seen by the heart failure clinic in 05/2018, he was back in sinus rhythm.  Follow-up echo in 06/2018 showed improvement in LV systolic function with an EF of 45 to 50%, diffuse hypokinesis, grade 1 diastolic dysfunction, mildly dilated left atrium, normal RV systolic function and cavity size.  He was seen in the office in 07/2018 and was doing well, maintaining sinus rhythm.  Given elevated BP, his losartan was titrated to 100 mg daily.  Weight was documented at 198.8 pounds but the patient stated he has been weighing 182 pounds on his home scale.  Baseline PFTs that showed mild airway obstruction felt to be in the setting of his prior smoking history.    He was admitted to the hospital in 06/2019, with increased shortness of breath and lower extremity swelling for 2 days with associated fever of unknown origin.  He was Covid negative.  Echo showed an EF of 30 to 35%, mildly to moderately dilated left ventricle, diffuse hypokinesis, normal LV diastolic  function, mild biatrial enlargement, mild to moderate mitral regurgitation, grossly normal aortic valve, normal in size and structure aorta.  He was diuresed with IV Lasix and noted improvement in symptoms.  HS-Tn 18 with a delta of 17.  EKG showed A. fib with RVR, 119 bpm.  He was seen in hospital follow-up on 07/17/2019 and was doing very well from a cardiac perspective.  He indicated he had discontinued his Lasix, losartan, and metoprolol leading up to his above hospitalization.   He did report compliance with Eliquis.  He remained in A. fib at that visit.  He was euvolemic with a weight of 176 pounds, which was down 15 pounds when compared to his clinic visit in 07/2018.  Toprol-XL was titrated to 50 mg daily.  His slight reduction in his EF was felt to be in the setting of A. fib with RVR and medication noncompliance.  He was advised to follow-up in 2 weeks for repeating of his EKG on increased dose of Toprol and if he remained in A. fib rhythm control strategy was to be considered at that time.  ***   Labs independently reviewed: 06/2019 - potassium 3.5, BUN 12, serum creatinine 0.98, Hgb 14.2, PLT 229AST/ALT normal, albumin 4.5 04/2018 - total cholesterol 126, triglycerides 65, HDL 38, LDL 75, TSH normal  Past Medical History:  Diagnosis Date  . HFrEF (heart failure with reduced ejection fraction) (HCC)    a. 04/2018 Echo: EF 25-30%; b.  06/2018 Echo: EF 45-50%. diff HK, Gr1 DD. Nl RV fxn.  . Hypertension   . NICM (nonischemic cardiomyopathy) (HCC)    a. 04/2018 Echo: EF 25-30%, diff HK, mildly dil Ao root, mod MR, mod dil LA, mildly to mod reduced RV fxn, mildly dil RA, PASP ; b. 04/2018 Cath: Nl cors. CO 2.98/CI 1.47; c. 06/2018 Echo: EF 45-50%. diff HK, Gr1 DD. Nl RV fxn.  . Persistent atrial fibrillation    a. Dx 04/2018 s/p DCCV and amio loading. CHA2DS2VASc 3-->eliquis.    Past Surgical History:  Procedure Laterality Date  . CARDIAC CATHETERIZATION    . RIGHT/LEFT HEART CATH AND  CORONARY ANGIOGRAPHY N/A 05/05/2018   Procedure: RIGHT/LEFT HEART CATH AND CORONARY ANGIOGRAPHY;  Surgeon: Iran Ouch, MD;  Location: [facility] INVASIVE CV LAB;  Service: Cardiovascular;  Laterality: N/A;  . TEE WITHOUT CARDIOVERSION N/A 05/07/2018   Procedure: TRANSESOPHAGEAL ECHOCARDIOGRAM (TEE);  Surgeon: Yvonne Kendall, MD;  Location: [facility] ORS;  Service: Cardiovascular;  Laterality: N/A;    Current Medications: No outpatient medications have been marked as taking for the 08/03/20 encounter (Appointment) with Sondra Barges, PA-C.    Allergies:   Entresto [sacubitril-valsartan]   Social History   Socioeconomic History  . Marital status: Divorced    Spouse name: Not on file  . Number of children: 2  . Years of education: 17  . Highest education level: 12th grade  Occupational History  . Occupation: retired  Tobacco Use  . Smoking status: Former Smoker    Years: 10.00    Quit date: 05/28/1998    Years since quitting: 22.1  . Smokeless tobacco: Former Neurosurgeon    Types: Chew    Quit date: 05/28/1998  Vaping Use  . Vaping Use: Never used  Substance and Sexual Activity  . Alcohol use: Yes    Comment: occassional 1 beer  . Drug use: Never  . Sexual activity: Yes    Birth control/protection: Condom  Other Topics Concern  . Not on file  Social History Narrative  . Not on file   Social Determinants of Health   Financial Resource Strain:   . Difficulty of Paying Living Expenses: Not on file  Food Insecurity:   . Worried About Programme researcher, broadcasting/film/video in the Last Year: Not on file  . Ran Out of Food in the Last Year: Not on file  Transportation Needs:   . Lack of Transportation (Medical): Not on file  . Lack of Transportation (Non-Medical): Not on file  Physical Activity:   . Days of Exercise per Week: Not on file  . Minutes of Exercise per Session: Not on file  Stress:   . Feeling of Stress : Not on file  Social Connections:   . Frequency of Communication with Friends and Family:  Not on file  . Frequency of Social Gatherings with Friends and Family: Not on file  . Attends Religious Services: Not on file  . Active Member of Clubs or Organizations: Not on file  . Attends Banker Meetings: Not on file  . Marital Status: Not on file     Family History:  The patient's family history includes Cancer in his mother; Cervical cancer in his mother; Dementia in his father; Heart disease in his sister; Heart failure in his father.  ROS:   ROS   EKGs/Labs/Other Studies Reviewed:    Studies reviewed were summarized above. The additional studies were reviewed today:  2D Echo 07/05/2019: 1. The left ventricle has moderate-severely  reduced systolic function, with an ejection fraction of 30-35%. The cavity size was mild to moderately dilated. Findings are consistent with dilated cardiomyopathy. Left ventricular diastolic parameters were  normal. Left ventricular diffuse hypokinesis. 2. The right ventricle has moderately reduced systolic function. The cavity was moderately enlarged. There is no increase in right ventricular wall thickness. 3. Left atrial size was mildly dilated. 4. Right atrial size was mildly dilated. 5. The mitral valve is grossly normal. Mild thickening of the mitral valve leaflet. Mitral valve regurgitation is mild to moderate by color flow Doppler. 6. The aortic valve is grossly normal. 7. Mild valvular pulmonic stenosis. 8. The aorta is normal in size and structure. 9. The interatrial septum was not assessed. __________  [facility] 04/2018: 1. Normal coronary arteries. 2. Severely reduced LV systolic function by echo. Left ventricular angiography was not performed. 3. Right heart catheterization showed severely elevated filling pressures, moderate pulmonary hypertension and severely reduced cardiac output. Cardiac output was 2.98 with a cardiac index of 1.47.  Recommendations: The patient has nonischemic cardiomyopathy likely  tachycardia induced. He continues to be significantly volume overloaded and I recommend continuing IV diuresis. Resume heparin drip 8 hours after sheath pull. A DOAC can be started tomorrow.  I recommend TEE guided cardioversion before hospital discharge given degree of cardiomyopathy. This can likely be done on Wednesday once volume overload improves. I am going to start the patient on oral amiodarone to facilitate cardioversion.   EKG:  EKG is ordered today.  The EKG ordered today demonstrates ***  Recent Labs: No results found for requested labs within last 8760 hours.  Recent Lipid Panel    Component Value Date/Time   CHOL 126 05/04/2018 0138   TRIG 65 05/04/2018 0138   HDL 38 (L) 05/04/2018 0138   CHOLHDL 3.3 05/04/2018 0138   VLDL 13 05/04/2018 0138   LDLCALC 75 05/04/2018 0138    PHYSICAL EXAM:    VS:  There were no vitals taken for this visit.  BMI: There is no height or weight on file to calculate BMI.  Physical Exam  Wt Readings from Last 3 Encounters:  07/17/19 176 lb 12.8 oz (80.2 kg)  07/13/19 178 lb (80.7 kg)  07/06/19 178 lb 1.6 oz (80.8 kg)     ASSESSMENT & PLAN:   1. HFrEF secondary to NICM:  2. Persistent A. Fib:  3. HTN: Blood pressure ***  Disposition: F/u with Dr. Kirke Corin or an APP in ***.   Medication Adjustments/Labs and Tests Ordered: Current medicines are reviewed at length with the patient today.  Concerns regarding medicines are outlined above. Medication changes, Labs and Tests ordered today are summarized above and listed in the Patient Instructions accessible in Encounters.   Signed, Eula Listen, PA-C 08/02/2020 12:06 PM     [facility] [place] [address] [place], [place] [address] [phone]

## 2020-08-02 NOTE — Telephone Encounter (Signed)
Please advise if ok to refill Losartan 50 mg 2 tablets qd. Pt has hx of No show.  Scheduled appointment for 08/03/2020 w/Dunn.

## 2020-08-02 NOTE — Telephone Encounter (Signed)
Scheduled for 9/8 with Dunn

## 2020-08-03 ENCOUNTER — Ambulatory Visit: Payer: Medicare Other | Admitting: Physician Assistant

## 2020-08-03 NOTE — Progress Notes (Signed)
Cardiology Office Note    Date:  08/04/2020   ID:  Donald Molina, DOB [birth year], MRN [id number]  PCP:  Patient, No Pcp Per  Cardiologist:  Donald Bears, MD  Electrophysiologist:  None   Chief Complaint: Follow up  History of Present Illness:   Donald Molina is a 69 y.o. male with history of HFrEF secondary to NICM, persistent A. fib on Eliquis, and HTN who presents for follow up of his cardiomyopathy.  He was admitted to the hospital and 04/2018 with a several week history of dyspnea and found to be in A. fib.  He was markedly volume overloaded.  Echo at that time showed an EF of 25 to 30%.  Diagnostic cardiac cath showed normal coronary arteries.  Following diuresis and amiodarone loading, he underwent successful cardioversion.  When he initially followed up with our office in late 04/2018 he reported noncompliance with several of his medications including amiodarone and was noted to be back in A. fib.  Medications were resumed and when he was seen by the heart failure clinic in 05/2018, he was back in sinus rhythm.  Follow-up echo in 06/2018 showed improvement in LV systolic function with an EF of 45 to 50%, diffuse hypokinesis, grade 1 diastolic dysfunction, mildly dilated left atrium, normal RV systolic function and cavity size.  He was seen in the office in 07/2018 and was doing well, maintaining sinus rhythm.  Given elevated BP, his losartan was titrated to 100 mg daily.  Weight was documented at 198.8 pounds but the patient stated he has been weighing 182 pounds on his home scale.  Baseline PFTs that showed mild airway obstruction felt to be in the setting of his prior smoking history.    He was admitted to the hospital in 06/2019, with increased shortness of breath and lower extremity swelling for 2 days with associated fever of unknown origin.  He was Covid negative.  Echo showed an EF of 30 to 35%, mildly to moderately dilated left ventricle, diffuse hypokinesis, normal LV diastolic  function, mild biatrial enlargement, mild to moderate mitral regurgitation, grossly normal aortic valve, normal in size and structure aorta.  He was diuresed with IV Lasix and noted improvement in symptoms.  HS-Tn 18 with a delta of 17.  EKG showed A. fib with RVR, 119 bpm.  He was seen in hospital follow-up on 07/17/2019 and was doing very well from a cardiac perspective.  He indicated he had discontinued his Lasix, losartan, and metoprolol leading up to his above hospitalization.   He did report compliance with Eliquis.  He remained in A. fib at that visit.  He was euvolemic with a weight of 176 pounds, which was down 15 pounds when compared to his clinic visit in 07/2018.  Toprol-XL was titrated to 50 mg daily.  His slight reduction in his EF was felt to be in the setting of A. fib with RVR and medication noncompliance.  He was advised to follow-up in 2 weeks for repeating of his EKG on increased dose of Toprol and if he remained in A. fib rhythm control strategy was to be considered at that time.  He comes in doing well from a cardiac perspective.  He denies any chest pain, palpitations, dizziness, presyncope, syncope, lower extremity swelling, abdominal distention, PND, or early satiety.  He has a stable, longstanding two-pillow orthopnea.  HE is ambulating approximately 2 miles daily without cardiac limitation.  He has been adherent to all medications though has been out of [place]  for the past 3 days.  His weight is up 7 pounds today when compared to his last visit in 06/2019 and he attributes this to increased p.o. intake.  He does try and watch his salt intake though does eat bacon approximately once per week.  He is not checking his BP at home.  No falls, hematochezia, melena, hemoptysis, hematuria, or hematemesis.   Labs independently reviewed: 06/2019 - potassium 3.5, BUN 12, serum creatinine 0.98, Hgb 14.2, PLT 229, AST/ALT normal, albumin 4.5 04/2018 - total cholesterol 126, triglycerides 65, HDL 38,  LDL 75, TSH normal   Past Medical History:  Diagnosis Date   HFrEF (heart failure with reduced ejection fraction) (HCC)    a. 04/2018 Echo: EF 25-30%; b. 06/2018 Echo: EF 45-50%. diff HK, Gr1 DD. Nl RV fxn.   Hypertension    NICM (nonischemic cardiomyopathy) (HCC)    a. 04/2018 Echo: EF 25-30%, diff HK, mildly dil Ao root, mod MR, mod dil LA, mildly to mod reduced RV fxn, mildly dil RA, PASP ; b. 04/2018 Cath: Nl cors. CO 2.98/CI 1.47; c. 06/2018 Echo: EF 45-50%. diff HK, Gr1 DD. Nl RV fxn.   Persistent atrial fibrillation (HCC)    a. Dx 04/2018 s/p DCCV and amio loading. CHA2DS2VASc 3-->eliquis.    Past Surgical History:  Procedure Laterality Date   CARDIAC CATHETERIZATION     RIGHT/LEFT HEART CATH AND CORONARY ANGIOGRAPHY N/A 05/05/2018   Procedure: RIGHT/LEFT HEART CATH AND CORONARY ANGIOGRAPHY;  Surgeon: Iran Ouch, MD;  Location: [facility] INVASIVE CV LAB;  Service: Cardiovascular;  Laterality: N/A;   TEE WITHOUT CARDIOVERSION N/A 05/07/2018   Procedure: TRANSESOPHAGEAL ECHOCARDIOGRAM (TEE);  Surgeon: Donald Kendall, MD;  Location: [facility] ORS;  Service: Cardiovascular;  Laterality: N/A;    Current Medications: Current Meds  Medication Sig   amiodarone (PACERONE) 200 MG tablet Take 1 tablet (200 mg total) by mouth daily.   apixaban (ELIQUIS) 5 MG TABS tablet Take 1 tablet (5 mg total) by mouth 2 (two) times daily.   furosemide (LASIX) 40 MG tablet Take 1 tablet (40 mg total) by mouth daily. *NEEDS APPOINTMENT FOR FURTHER REFILLS-PLEASE CALL [phone] TO SCHEDULE.* 2ND ATTEMPT   losartan (COZAAR) 50 MG tablet Take 2 tablets (100 mg total) by mouth daily. Take 100 mg by mouth daily. *NEEDS OFFICE VISIT FOR FURTHER REFILLS* 3RD and FINAL ATTEMPT   metoprolol succinate (TOPROL-XL) 50 MG 24 hr tablet Take 1 tablet (50 mg total) by mouth daily.    Allergies:   Entresto [sacubitril-valsartan]   Social History   Socioeconomic History   Marital status: Divorced     Spouse name: Not on file   Number of children: 2   Years of education: 12   Highest education level: 12th grade  Occupational History   Occupation: retired  Tobacco Use   Smoking status: Former Smoker    Years: 10.00    Quit date: 05/28/1998    Years since quitting: 22.2   Smokeless tobacco: Former Neurosurgeon    Types: Chew    Quit date: 05/28/1998  Vaping Use   Vaping Use: Never used  Substance and Sexual Activity   Alcohol use: Yes    Comment: occassional 1 beer   Drug use: Never   Sexual activity: Yes    Birth control/protection: Condom  Other Topics Concern   Not on file  Social History Narrative   Not on file   Social Determinants of Health   Financial Resource Strain:    Difficulty of Paying Living  Expenses: Not on file  Food Insecurity:    Worried About Programme researcher, broadcasting/film/video in the Last Year: Not on file   [employer] of Food in the Last Year: Not on file  Transportation Needs:    Lack of Transportation (Medical): Not on file   Lack of Transportation (Non-Medical): Not on file  Physical Activity:    Days of Exercise per Week: Not on file   Minutes of Exercise per Session: Not on file  Stress:    Feeling of Stress : Not on file  Social Connections:    Frequency of Communication with Friends and Family: Not on file   Frequency of Social Gatherings with Friends and Family: Not on file   Attends Religious Services: Not on file   Active Member of Clubs or Organizations: Not on file   Attends Banker Meetings: Not on file   Marital Status: Not on file     Family History:  The patient's family history includes Cancer in his mother; Cervical cancer in his mother; Dementia in his father; Heart disease in his sister; Heart failure in his father.  ROS:   Review of Systems  Constitutional: Negative for chills, diaphoresis, fever, malaise/fatigue and weight loss.  HENT: Negative for congestion.   Eyes: Negative for discharge and redness.    Respiratory: Negative for cough, hemoptysis, sputum production, shortness of breath and wheezing.   Cardiovascular: Positive for orthopnea. Negative for chest pain, palpitations, claudication, leg swelling and PND.       Stable two-pillow orthopnea  Gastrointestinal: Negative for abdominal pain, blood in stool, heartburn, melena, nausea and vomiting.  Genitourinary: Negative for hematuria.  Musculoskeletal: Negative for falls and myalgias.  Skin: Negative for rash.  Neurological: Negative for dizziness, tingling, tremors, sensory change, speech change, focal weakness, loss of consciousness and weakness.  Endo/Heme/Allergies: Does not bruise/bleed easily.  Psychiatric/Behavioral: Negative for substance abuse. The patient is not nervous/anxious.   All other systems reviewed and are negative.    EKGs/Labs/Other Studies Reviewed:    Studies reviewed were summarized above. The additional studies were reviewed today:  2D Echo 07/05/2019: 1. The left ventricle has moderate-severely reduced systolic function, with an ejection fraction of 30-35%. The cavity size was mild to moderately dilated. Findings are consistent with dilated cardiomyopathy. Left ventricular diastolic parameters were  normal. Left ventricular diffuse hypokinesis. 2. The right ventricle has moderately reduced systolic function. The cavity was moderately enlarged. There is no increase in right ventricular wall thickness. 3. Left atrial size was mildly dilated. 4. Right atrial size was mildly dilated. 5. The mitral valve is grossly normal. Mild thickening of the mitral valve leaflet. Mitral valve regurgitation is mild to moderate by color flow Doppler. 6. The aortic valve is grossly normal. 7. Mild valvular pulmonic stenosis. 8. The aorta is normal in size and structure. 9. The interatrial septum was not assessed. __________  [facility] 04/2018: 1. Normal coronary arteries. 2. Severely reduced LV systolic function by  echo. Left ventricular angiography was not performed. 3. Right heart catheterization showed severely elevated filling pressures, moderate pulmonary hypertension and severely reduced cardiac output. Cardiac output was 2.98 with a cardiac index of 1.47.  Recommendations: The patient has nonischemic cardiomyopathy likely tachycardia induced. He continues to be significantly volume overloaded and I recommend continuing IV diuresis. Resume heparin drip 8 hours after sheath pull. A DOAC can be started tomorrow.  I recommend TEE guided cardioversion before hospital discharge given degree of cardiomyopathy. This can likely be done on  Wednesday once volume overload improves. I am going to start the patient on oral amiodarone to facilitate cardioversion.    EKG:  EKG is ordered today.  The EKG ordered today demonstrates NSR, 70 bpm, left axis deviation, left anterior fascicular block, LVH, no acute ST-T changes  Recent Labs: No results found for requested labs within last 8760 hours.  Recent Lipid Panel    Component Value Date/Time   CHOL 126 05/04/2018 0138   TRIG 65 05/04/2018 0138   HDL 38 (L) 05/04/2018 0138   CHOLHDL 3.3 05/04/2018 0138   VLDL 13 05/04/2018 0138   LDLCALC 75 05/04/2018 0138    PHYSICAL EXAM:    VS:  BP (!) 150/98 (BP Location: Left Arm, Patient Position: Sitting, Cuff Size: Normal)    Pulse 78    Ht 5\' 11"  (1.803 m)    Wt 183 lb (83 kg)    SpO2 95%    BMI 25.52 kg/m   BMI: Body mass index is 25.52 kg/m.  Physical Exam Constitutional:      Appearance: He is well-developed.  HENT:     Head: Normocephalic and atraumatic.  Eyes:     General:        Right eye: No discharge.        Left eye: No discharge.  Neck:     Vascular: No JVD.  Cardiovascular:     Rate and Rhythm: Normal rate and regular rhythm.     Pulses: No midsystolic click and no opening snap.          Posterior tibial pulses are 2+ on the right side and 2+ on the left side.     Heart sounds:  Normal heart sounds, S1 normal and S2 normal. Heart sounds not distant. No murmur heard.  No friction rub.  Pulmonary:     Effort: Pulmonary effort is normal. No respiratory distress.     Breath sounds: Examination of the right-lower field reveals rales. Examination of the left-lower field reveals rales. Rales present. No decreased breath sounds or wheezing.  Chest:     Chest wall: No tenderness.  Abdominal:     General: There is no distension.     Palpations: Abdomen is soft.     Tenderness: There is no abdominal tenderness.  Musculoskeletal:     Cervical back: Normal range of motion.  Skin:    General: Skin is warm and dry.     Nails: There is no clubbing.  Neurological:     Mental Status: He is alert and oriented to person, place, and time.  Psychiatric:        Speech: Speech normal.        Behavior: Behavior normal.        Thought Content: Thought content normal.        Judgment: Judgment normal.     Wt Readings from Last 3 Encounters:  08/04/20 183 lb (83 kg)  07/17/19 176 lb 12.8 oz (80.2 kg)  07/13/19 178 lb (80.7 kg)     ReDs vest: 29%  ASSESSMENT & PLAN:   1. HFrEF secondary to NICM: He is doing well and appears euvolemicwell compensated.  NYHA class I symptoms.  He is ambulating ~ 2 miles daily without cardiac limitation.  He has been out of 07/15/19 for approximately 3 days, this will be refilled.  Titrate Toprol-XL to 75 mg daily.  Otherwise, he will continue losartan and furosemide.  He is not on Entresto secondary to associated hypotension with this medication.  Check renal function and potassium today.  If these allow, plan to add spironolactone over the phone with plan to follow-up renal function approximately 1 week later to assess stability on this medication.  Check echo now that he is back in sinus rhythm and has been compliant with medications.  His weight is up 7 pounds today when compared to his last visit in 06/2019, which he attributes to increased p.o.  intake and he does have faint rales in the lower lobes today.  Recommend he decrease PO fluid and salt intake.  Without symptoms and a normal ReDs vest reading in the office today, we will defer escalation of Lasix at this time.  Continued optimization of his medical therapy has been delayed in the setting of medication nonadherence and loss to follow up.  CHF education.   2. Persistent Afib: He is currently in sinus rhythm. Titrate Toprol-XL to 75 mg daily for added rate control and improvement in BP.  He remains on amiodarone 200 mg daily.  Given his age, we should consider discontinuing this in follow-up with continuation of metoprolol.  Showed A. fib return in this setting could consider referral to EP or A. fib clinic for consideration of alternative antiarrhythmic therapy.  Given his CHADS2VASc of 3 (CHF, HTN, age x 1) he should continue Eliquis indefinitely.  Check CBC, CMP, and TSH.  Prior PFTs noted as above.  Annual eye exams recommended.    3. HTN: Blood pressure is suboptimally controlled in the office this morning with a reading of 150/98.  Titrate Toprol-XL to 75 mg daily.  Otherwise continue current dose losartan and furosemide.  If BMP allows we will plan to add spironolactone as outlined above.  Disposition: F/u with Dr. Kirke Corin or an APP in 4-6 weeks.   Medication Adjustments/Labs and Tests Ordered: Current medicines are reviewed at length with the patient today.  Concerns regarding medicines are outlined above. Medication changes, Labs and Tests ordered today are summarized above and listed in the Patient Instructions accessible in Encounters.   Signed, Eula Listen, PA-C 08/04/2020 9:16 AM     [facility] [address] [place], [place] [address] [phone]

## 2020-08-04 ENCOUNTER — Other Ambulatory Visit: Payer: Self-pay

## 2020-08-04 ENCOUNTER — Encounter: Payer: Self-pay | Admitting: Physician Assistant

## 2020-08-04 ENCOUNTER — Ambulatory Visit (INDEPENDENT_AMBULATORY_CARE_PROVIDER_SITE_OTHER): Payer: Medicare Other | Admitting: Physician Assistant

## 2020-08-04 VITALS — BP 150/98 | HR 78 | Ht 71.0 in | Wt 183.0 lb

## 2020-08-04 DIAGNOSIS — E7800 Pure hypercholesterolemia, unspecified: Secondary | ICD-10-CM | POA: Diagnosis not present

## 2020-08-04 DIAGNOSIS — I10 Essential (primary) hypertension: Secondary | ICD-10-CM | POA: Diagnosis not present

## 2020-08-04 DIAGNOSIS — I428 Other cardiomyopathies: Secondary | ICD-10-CM

## 2020-08-04 DIAGNOSIS — I4819 Other persistent atrial fibrillation: Secondary | ICD-10-CM

## 2020-08-04 DIAGNOSIS — R0600 Dyspnea, unspecified: Secondary | ICD-10-CM

## 2020-08-04 DIAGNOSIS — I5022 Chronic systolic (congestive) heart failure: Secondary | ICD-10-CM | POA: Diagnosis not present

## 2020-08-04 DIAGNOSIS — I4891 Unspecified atrial fibrillation: Secondary | ICD-10-CM | POA: Diagnosis not present

## 2020-08-04 MED ORDER — METOPROLOL SUCCINATE ER 50 MG PO TB24: 75.0000 mg | ORAL_TABLET | Freq: Every day | ORAL | 11 refills | Status: DC

## 2020-08-04 MED ORDER — LOSARTAN POTASSIUM 50 MG PO TABS: 100.0000 mg | ORAL_TABLET | Freq: Every day | ORAL | 0 refills | Status: DC

## 2020-08-04 MED ORDER — DAPAGLIFLOZIN PROPANEDIOL 10 MG PO TABS: 10.0000 mg | ORAL_TABLET | Freq: Every day | ORAL | 11 refills | Status: DC

## 2020-08-04 NOTE — Patient Instructions (Signed)
Medication Instructions:  1- INCREASE Toprol *If you need a refill on your cardiac medications before your next appointment, please call your pharmacy*   Lab Work: Your physician recommends that you have lab work today(CMP, CBC, Tsh, lipid)  If you have labs (blood work) drawn today and your tests are completely normal, you will receive your results only by: Marland Kitchen MyChart Message (if you have MyChart) OR . A paper copy in the mail If you have any lab test that is abnormal or we need to change your treatment, we will call you to review the results.   Testing/Procedures: 1- Echo  Please return to [facility] on ______________ at _______________ AM/PM for an Echocardiogram. Your physician has requested that you have an echocardiogram. Echocardiography is a painless test that uses sound waves to create images of your heart. It provides your doctor with information about the size and shape of your heart and how well your heart's chambers and valves are working. This procedure takes approximately one hour. There are no restrictions for this procedure. Please note; depending on visual quality an IV may need to be placed.     Follow-Up: At [facility], you and your health needs are our priority.  As part of our continuing mission to provide you with exceptional heart care, we have created designated Provider Care Teams.  These Care Teams include your primary Cardiologist (physician) and Advanced Practice Providers (APPs -  Physician Assistants and Nurse Practitioners) who all work together to provide you with the care you need, when you need it.  We recommend signing up for the patient portal called "MyChart".  Sign up information is provided on this After Visit Summary.  MyChart is used to connect with patients for Virtual Visits (Telemedicine).  Patients are able to view lab/test results, encounter notes, upcoming appointments, etc.  Non-urgent messages can be sent to your provider as  well.   To learn more about what you can do with MyChart, go to [email or web address].    Your next appointment:   4-6 week(s)  The format for your next appointment:   In Person  Provider:    You may see Lorine Bears, MD or Eula Listen, PA-C

## 2020-08-05 ENCOUNTER — Telehealth: Payer: Self-pay

## 2020-08-05 LAB — COMPREHENSIVE METABOLIC PANEL
ALT: 34 IU/L (ref 0–44)
AST: 67 IU/L — ABNORMAL HIGH (ref 0–40)
Albumin/Globulin Ratio: 1.2 (ref 1.2–2.2)
Albumin: 4.4 g/dL (ref 3.8–4.8)
Alkaline Phosphatase: 76 IU/L (ref 48–121)
BUN/Creatinine Ratio: 10 (ref 10–24)
BUN: 10 mg/dL (ref 8–27)
Bilirubin Total: 0.8 mg/dL (ref 0.0–1.2)
CO2: 25 mmol/L (ref 20–29)
Calcium: 9.7 mg/dL (ref 8.6–10.2)
Chloride: 101 mmol/L (ref 96–106)
Creatinine, Ser: 1.02 mg/dL (ref 0.76–1.27)
GFR calc Af Amer: 86 mL/min/{1.73_m2} (ref >59)
GFR calc non Af Amer: 75 mL/min/{1.73_m2} (ref >59)
Globulin, Total: 3.7 g/dL (ref 1.5–4.5)
Glucose: 104 mg/dL — ABNORMAL HIGH (ref 65–99)
Potassium: 4.7 mmol/L (ref 3.5–5.2)
Sodium: 141 mmol/L (ref 134–144)
Total Protein: 8.1 g/dL (ref 6.0–8.5)

## 2020-08-05 LAB — CBC
Hematocrit: 41.4 % (ref 37.5–51.0)
Hemoglobin: 14.8 g/dL (ref 13.0–17.7)
MCH: 32.8 pg (ref 26.6–33.0)
MCHC: 35.7 g/dL (ref 31.5–35.7)
MCV: 92 fL (ref 79–97)
Platelets: 191 10*3/uL (ref 150–450)
RBC: 4.51 x10E6/uL (ref 4.14–5.80)
RDW: 12.5 % (ref 11.6–15.4)
WBC: 4.9 10*3/uL (ref 3.4–10.8)

## 2020-08-05 LAB — LIPID PANEL
Chol/HDL Ratio: 3.2 ratio (ref 0.0–5.0)
Cholesterol, Total: 201 mg/dL — ABNORMAL HIGH (ref 100–199)
HDL: 63 mg/dL (ref >39)
LDL Chol Calc (NIH): 119 mg/dL — ABNORMAL HIGH (ref 0–99)
Triglycerides: 110 mg/dL (ref 0–149)
VLDL Cholesterol Cal: 19 mg/dL (ref 5–40)

## 2020-08-05 LAB — TSH: TSH: 1.21 u[IU]/mL (ref 0.450–4.500)

## 2020-08-05 NOTE — Telephone Encounter (Signed)
-----   Message from Sondra Barges, PA-C sent at 08/05/2020 10:24 AM EDT ----- Random glucose ok Renal function normal Potassium at goal One liver test is mildly elevated, with remaining values normal Blood count normal Thyroid function normal LDL is mildly elevated  Recommendations: -Stop amiodarone, if his Afib returns off amiodarone, recommend he see EP at that time -Work on improving heart healthy diet, if in follow up, his AST (liver function test) is improved off amiodarone, and if his LDL remains mildly elevated at that time, we could consider addition of low dose statin  Follow up as planned

## 2020-08-09 NOTE — Telephone Encounter (Signed)
Call attempted. No answer, no vm. 

## 2020-08-10 NOTE — Telephone Encounter (Signed)
Multiple attempts to reach pt. NA/NV.  Letter sent.  Encounter completed at this time.

## 2020-08-18 ENCOUNTER — Other Ambulatory Visit: Payer: Self-pay

## 2020-08-18 MED ORDER — LOSARTAN POTASSIUM 50 MG PO TABS: 100.0000 mg | ORAL_TABLET | Freq: Every day | ORAL | 0 refills | Status: DC

## 2020-08-25 ENCOUNTER — Other Ambulatory Visit: Payer: Medicaid Other

## 2020-08-26 ENCOUNTER — Telehealth: Payer: Self-pay | Admitting: Cardiovascular Disease

## 2020-08-26 NOTE — Telephone Encounter (Signed)
Attempted to schedule no ans no vm  

## 2020-08-26 NOTE — Telephone Encounter (Signed)
-----   Message from Andi Devon sent at 08/26/2020 11:26 AM EDT ----- Regarding: Donald Molina Patient did not show for echo on 08/25/20. Please reschedule 09/06/20 appointment until after patient has echo. Thank you!

## 2020-09-06 ENCOUNTER — Ambulatory Visit: Payer: Medicaid Other | Admitting: Cardiovascular Disease

## 2020-09-12 ENCOUNTER — Other Ambulatory Visit: Payer: Self-pay

## 2020-09-12 MED ORDER — AMIODARONE HCL 200 MG PO TABS: 200.0000 mg | ORAL_TABLET | Freq: Every day | ORAL | 0 refills | Status: DC

## 2020-11-15 ENCOUNTER — Other Ambulatory Visit: Payer: Self-pay | Admitting: Physician Assistant

## 2021-04-19 ENCOUNTER — Emergency Department: Payer: Medicare Other

## 2021-04-19 ENCOUNTER — Emergency Department
Admission: EM | Admit: 2021-04-19 | Discharge: 2021-04-20 | Disposition: A | Discharge status: Home / Self Care | Payer: Medicare Other | Attending: Emergency Medicine | Admitting: Emergency Medicine

## 2021-04-19 ENCOUNTER — Encounter: Payer: Self-pay | Admitting: Radiology

## 2021-04-19 ENCOUNTER — Telehealth: Payer: Self-pay | Admitting: Cardiovascular Disease

## 2021-04-19 ENCOUNTER — Other Ambulatory Visit: Payer: Self-pay

## 2021-04-19 DIAGNOSIS — I4819 Other persistent atrial fibrillation: Secondary | ICD-10-CM | POA: Diagnosis not present

## 2021-04-19 DIAGNOSIS — Z20822 Contact with and (suspected) exposure to covid-19: Secondary | ICD-10-CM | POA: Insufficient documentation

## 2021-04-19 DIAGNOSIS — Z79899 Other long term (current) drug therapy: Secondary | ICD-10-CM | POA: Insufficient documentation

## 2021-04-19 DIAGNOSIS — R0789 Other chest pain: Secondary | ICD-10-CM

## 2021-04-19 DIAGNOSIS — R1111 Vomiting without nausea: Secondary | ICD-10-CM | POA: Diagnosis not present

## 2021-04-19 DIAGNOSIS — Z7901 Long term (current) use of anticoagulants: Secondary | ICD-10-CM | POA: Diagnosis not present

## 2021-04-19 DIAGNOSIS — Z87891 Personal history of nicotine dependence: Secondary | ICD-10-CM | POA: Insufficient documentation

## 2021-04-19 DIAGNOSIS — R110 Nausea: Secondary | ICD-10-CM | POA: Insufficient documentation

## 2021-04-19 DIAGNOSIS — R079 Chest pain, unspecified: Secondary | ICD-10-CM | POA: Diagnosis not present

## 2021-04-19 DIAGNOSIS — I110 Hypertensive heart disease with heart failure: Secondary | ICD-10-CM | POA: Diagnosis not present

## 2021-04-19 DIAGNOSIS — I5033 Acute on chronic diastolic (congestive) heart failure: Secondary | ICD-10-CM | POA: Insufficient documentation

## 2021-04-19 DIAGNOSIS — R072 Precordial pain: Secondary | ICD-10-CM | POA: Diagnosis not present

## 2021-04-19 DIAGNOSIS — I517 Cardiomegaly: Secondary | ICD-10-CM | POA: Diagnosis not present

## 2021-04-19 DIAGNOSIS — I10 Essential (primary) hypertension: Secondary | ICD-10-CM | POA: Diagnosis not present

## 2021-04-19 DIAGNOSIS — R001 Bradycardia, unspecified: Secondary | ICD-10-CM | POA: Diagnosis not present

## 2021-04-19 LAB — COMPREHENSIVE METABOLIC PANEL
ALT: 32 U/L (ref 0–44)
AST: 71 U/L — ABNORMAL HIGH (ref 15–41)
Albumin: 4.1 g/dL (ref 3.5–5.0)
Alkaline Phosphatase: 57 U/L (ref 38–126)
Anion gap: 9 (ref 5–15)
BUN: 15 mg/dL (ref 8–23)
CO2: 26 mmol/L (ref 22–32)
Calcium: 8.8 mg/dL — ABNORMAL LOW (ref 8.9–10.3)
Chloride: 106 mmol/L (ref 98–111)
Creatinine, Ser: 0.92 mg/dL (ref 0.61–1.24)
GFR, Estimated: >60 mL/min (ref >60)
Glucose, Bld: 98 mg/dL (ref 70–99)
Potassium: 4.7 mmol/L (ref 3.5–5.1)
Sodium: 141 mmol/L (ref 135–145)
Total Bilirubin: 0.8 mg/dL (ref 0.3–1.2)
Total Protein: 7.9 g/dL (ref 6.5–8.1)

## 2021-04-19 LAB — CBC WITH DIFFERENTIAL/PLATELET
Abs Immature Granulocytes: 0.01 10*3/uL (ref 0.00–0.07)
Basophils Absolute: 0 10*3/uL (ref 0.0–0.1)
Basophils Relative: 1 %
Eosinophils Absolute: 0.1 10*3/uL (ref 0.0–0.5)
Eosinophils Relative: 2 %
HCT: 36 % — ABNORMAL LOW (ref 39.0–52.0)
Hemoglobin: 13.4 g/dL (ref 13.0–17.0)
Immature Granulocytes: 0 %
Lymphocytes Relative: 29 %
Lymphs Abs: 1.6 10*3/uL (ref 0.7–4.0)
MCH: 32.8 pg (ref 26.0–34.0)
MCHC: 37.2 g/dL — ABNORMAL HIGH (ref 30.0–36.0)
MCV: 88.2 fL (ref 80.0–100.0)
Monocytes Absolute: 0.4 10*3/uL (ref 0.1–1.0)
Monocytes Relative: 8 %
Neutro Abs: 3.3 10*3/uL (ref 1.7–7.7)
Neutrophils Relative %: 60 %
Platelets: 182 10*3/uL (ref 150–400)
RBC: 4.08 MIL/uL — ABNORMAL LOW (ref 4.22–5.81)
RDW: 12.6 % (ref 11.5–15.5)
WBC: 5.4 10*3/uL (ref 4.0–10.5)
nRBC: 0 % (ref 0.0–0.2)

## 2021-04-19 LAB — BRAIN NATRIURETIC PEPTIDE: B Natriuretic Peptide: 67.5 pg/mL (ref 0.0–100.0)

## 2021-04-19 LAB — LIPASE, BLOOD: Lipase: 38 U/L (ref 11–51)

## 2021-04-19 LAB — D-DIMER, QUANTITATIVE: D-Dimer, Quant: 0.66 ug/mL-FEU — ABNORMAL HIGH (ref 0.00–0.50)

## 2021-04-19 LAB — TROPONIN I (HIGH SENSITIVITY)
Troponin I (High Sensitivity): 8 ng/L (ref <18)
Troponin I (High Sensitivity): 7 ng/L (ref <18)

## 2021-04-19 MED ORDER — FAMOTIDINE IN NACL 20-0.9 MG/50ML-% IV SOLN
20.0000 mg | Freq: Once | INTRAVENOUS | Status: AC
  Administered 2021-04-19: 20 mg via INTRAVENOUS
  Filled 2021-04-19: qty 50

## 2021-04-19 MED ORDER — IOHEXOL 350 MG/ML SOLN
75.0000 mL | Freq: Once | INTRAVENOUS | Status: AC | PRN
  Administered 2021-04-19: 75 mL via INTRAVENOUS

## 2021-04-19 MED ORDER — METOCLOPRAMIDE HCL 5 MG/ML IJ SOLN
10.0000 mg | Freq: Once | INTRAMUSCULAR | Status: AC
  Administered 2021-04-19: 10 mg via INTRAVENOUS
  Filled 2021-04-19: qty 2

## 2021-04-19 NOTE — Telephone Encounter (Signed)
Unable to reach after several attempts to schedule testing .  Mailed letter removing order. 

## 2021-04-19 NOTE — ED Notes (Signed)
Called lab to add on BNP and D-dimer to previously sent labs. Will add on at this time.

## 2021-04-19 NOTE — ED Triage Notes (Signed)
Pt arrived via [facility] from home with c/o indegestion like symptoms starting about 1 hour PTA (1930). Per EMS pt stated "it feels like an elephant is sitting on my chest"  Per EMS, pt was walking around upon arrival, no diaphoresis but had N/V.   Per EMS pt got 324mg  aspirin, 1 in nitro paste on L chest, and 4mg  IM zofran.   Pt able to move from EMS stretcher to ED stretcher, NAD noted. Pt A&Ox4 at this time.

## 2021-04-19 NOTE — ED Provider Notes (Signed)
[facility] Emergency Department Provider Note ____________________________________________   Event Date/Time   First MD Initiated Contact with Patient 04/19/21 2031     (approximate)  I have reviewed the triage vital signs and the nursing notes.   HISTORY  Chief Complaint Chest Pain    HPI Donald Molina is a 70 y.o. male with PMH as noted below including CHF and atrial fibrillation who presents with chest pain, acute onset about an hour ago, described as burning or indigestion type of pain, substernal in location, and nonradiating.  He has had some associated nausea but no vomiting.  He denies any abdominal pain, shortness of breath, lightheadedness, or leg swelling.  He states that it occurred shortly after he ate some roast beef.  He denies a prior history of this pain.  He states he was feeling fine earlier.  He was given aspirin and nitroglycerin by EMS but states it did not help the pain.  Past Medical History:  Diagnosis Date  . HFrEF (heart failure with reduced ejection fraction) (HCC)    a. 04/2018 Echo: EF 25-30%; b. 06/2018 Echo: EF 45-50%. diff HK, Gr1 DD. Nl RV fxn.  . Hypertension   . NICM (nonischemic cardiomyopathy) (HCC)    a. 04/2018 Echo: EF 25-30%, diff HK, mildly dil Ao root, mod MR, mod dil LA, mildly to mod reduced RV fxn, mildly dil RA, PASP ; b. 04/2018 Cath: Nl cors. CO 2.98/CI 1.47; c. 06/2018 Echo: EF 45-50%. diff HK, Gr1 DD. Nl RV fxn.  . Persistent atrial fibrillation (HCC)    a. Dx 04/2018 s/p DCCV and amio loading. CHA2DS2VASc 3-->eliquis.    Patient Active Problem List   Diagnosis Date Noted  . Acute on chronic diastolic CHF (congestive heart failure) (HCC) 07/03/2019  . HTN (hypertension) 05/29/2018  . CHF (congestive heart failure) (HCC) 05/03/2018  . Atrial fibrillation with RVR (HCC)   . Acute pulmonary edema [facility])     Past Surgical History:  Procedure Laterality Date  . CARDIAC CATHETERIZATION    . RIGHT/LEFT  HEART CATH AND CORONARY ANGIOGRAPHY N/A 05/05/2018   Procedure: RIGHT/LEFT HEART CATH AND CORONARY ANGIOGRAPHY;  Surgeon: Iran Ouch, MD;  Location: [facility] INVASIVE CV LAB;  Service: Cardiovascular;  Laterality: N/A;  . TEE WITHOUT CARDIOVERSION N/A 05/07/2018   Procedure: TRANSESOPHAGEAL ECHOCARDIOGRAM (TEE);  Surgeon: Yvonne Kendall, MD;  Location: [facility] ORS;  Service: Cardiovascular;  Laterality: N/A;    Prior to Admission medications   Medication Sig Start Date End Date Taking? Authorizing Provider  amiodarone (PACERONE) 200 MG tablet Take 1 tablet (200 mg total) by mouth daily. 09/12/20   Creig Hines, NP  apixaban (ELIQUIS) 5 MG TABS tablet Take 1 tablet (5 mg total) by mouth 2 (two) times daily. 08/06/18   Creig Hines, NP  dapagliflozin propanediol (FARXIGA) 10 MG TABS tablet Take 1 tablet (10 mg total) by mouth daily before breakfast. 08/04/20   Dunn, Raymon Mutton, PA-C  furosemide (LASIX) 40 MG tablet Take 1 tablet (40 mg total) by mouth daily. *NEEDS APPOINTMENT FOR FURTHER REFILLS-PLEASE CALL [phone] TO SCHEDULE.* 2ND ATTEMPT 10/21/19   Antonieta Iba, MD  losartan (COZAAR) 50 MG tablet TAKE 2 TABLETS BY MOUTH EVERY DAY 11/15/20   Sondra Barges, PA-C  metoprolol succinate (TOPROL-XL) 50 MG 24 hr tablet Take 1.5 tablets (75 mg total) by mouth daily. 08/04/20 08/04/21  Sondra Barges, PA-C    Allergies Sherryll Burger [sacubitril-valsartan]  Family History  Problem Relation Age of Onset  .  Cancer Mother        pancreatic  . Cervical cancer Mother   . Dementia Father   . Heart failure Father   . Heart disease Sister     Social History Social History   Tobacco Use  . Smoking status: Former Smoker    Years: 10.00    Quit date: 05/28/1998    Years since quitting: 22.9  . Smokeless tobacco: Former Neurosurgeon    Types: Chew    Quit date: 05/28/1998  Vaping Use  . Vaping Use: Never used  Substance Use Topics  . Alcohol use: Yes    Comment: occassional 1 beer  .  Drug use: Never    Review of Systems  Constitutional: No fever/chills Eyes: No visual changes. ENT: No sore throat. Cardiovascular: Positive for chest pain. Respiratory: Denies shortness of breath. Gastrointestinal: Positive for nausea.  No vomiting or diarrhea.  Genitourinary: Negative for flank pain.  Musculoskeletal: Negative for back pain. Skin: Negative for rash. Neurological: Negative for headaches, focal weakness or numbness.   ____________________________________________   PHYSICAL EXAM:  VITAL SIGNS: ED Triage Vitals  Enc Vitals Group     BP 04/19/21 2032 (!) 180/89     Pulse Rate 04/19/21 2032 (!) 50     Resp 04/19/21 2032 17     Temp 04/19/21 2032 98.1 F (36.7 C)     Temp Source 04/19/21 2032 Oral     SpO2 04/19/21 2032 100 %     Weight 04/19/21 2036 181 lb (82.1 kg)     Height 04/19/21 2036 5\' 11"  (1.803 m)     Head Circumference --      Peak Flow --      Pain Score 04/19/21 2036 5     Pain Loc --      Pain Edu? --      Excl. in GC? --     Constitutional: Alert and oriented. Well appearing and in no acute distress. Eyes: Conjunctivae are normal.  Head: Atraumatic. Nose: No congestion/rhinnorhea. Mouth/Throat: Mucous membranes are moist.   Neck: Normal range of motion.  Cardiovascular: Normal rate, regular rhythm. Grossly normal heart sounds.  Good peripheral circulation. Respiratory: Normal respiratory effort.  No retractions. Lungs CTAB. Gastrointestinal: Soft and nontender. No distention.  Genitourinary: No flank tenderness. Musculoskeletal: No lower extremity edema.  Extremities warm and well perfused.  Neurologic:  Normal speech and language. No gross focal neurologic deficits are appreciated.  Skin:  Skin is warm and dry. No rash noted. Psychiatric: Mood and affect are normal. Speech and behavior are normal.  ____________________________________________   LABS (all labs ordered are listed, but only abnormal results are displayed)  Labs  Reviewed  COMPREHENSIVE METABOLIC PANEL - Abnormal; Notable for the following components:      Result Value   Calcium 8.8 (*)    AST 71 (*)    All other components within normal limits  CBC WITH DIFFERENTIAL/PLATELET - Abnormal; Notable for the following components:   RBC 4.08 (*)    HCT 36.0 (*)    MCHC 37.2 (*)    All other components within normal limits  D-DIMER, QUANTITATIVE - Abnormal; Notable for the following components:   D-Dimer, Quant 0.66 (*)    All other components within normal limits  RESP PANEL BY RT-PCR (FLU A&B, COVID) ARPGX2  LIPASE, BLOOD  BRAIN NATRIURETIC PEPTIDE  TROPONIN I (HIGH SENSITIVITY)  TROPONIN I (HIGH SENSITIVITY)   ____________________________________________  EKG  ED ECG REPORT I, 2037, the attending physician,  personally viewed and interpreted this ECG.  Date: 04/19/2021 EKG Time: 2034 Rate: 50 Rhythm: normal sinus rhythm QRS Axis: normal Intervals: Nonspecific IVCD ST/T Wave abnormalities: Nonspecific T wave abnormality Narrative Interpretation: Nonspecific abnormalities with no evidence of acute ischemia; no significant change when compared to EKG of 08/04/2020  ____________________________________________  RADIOLOGY  Chest x-ray interpreted by me shows no focal consolidation or edema  ____________________________________________   PROCEDURES  Procedure(s) performed: No  Procedures  Critical Care performed: No ____________________________________________   INITIAL IMPRESSION / ASSESSMENT AND PLAN / ED COURSE  Pertinent labs & imaging results that were available during my care of the patient were reviewed by me and considered in my medical decision making (see chart for details).  70 year old male with PMH as noted above including paroxysmal atrial fibrillation and CHF presents with somewhat burning atypical chest pain shortly after eating.  It was not relieved by aspirin or nitroglycerin given by EMS.  I  reviewed the past medical records in epic; the patient's most recent admission was in 2020 with a CHF exacerbation.  He is on Eliquis for A. fib.  He does not appear to have a history of CAD.  On exam, the patient is overall well-appearing.  He is hypertensive with otherwise normal vital signs.  The physical exam is unremarkable.  There is no peripheral edema.  He does not have any increased work of breathing or respiratory distress.  EKG is nonischemic.  Overall presentation is most consistent with GERD.  Differential includes gastritis, musculoskeletal pain, or less likely ACS.  I have a low suspicion for aortic dissection or other vascular cause given the patient's well appearance and lack of radiation to the back or specific risk factors for dissection.  I also do not suspect PE as the patient has no tachycardia or hypoxia, or any DVT symptoms.  We will obtain basic labs, troponins x2, chest x-ray, and reassess.  I have ordered IV Pepcid.  ----------------------------------------- 11:28 PM on 04/19/2021 -----------------------------------------  The patient reports improvement in his chest discomfort but still has some nausea and is belching.  Initial troponin is negative, and the BNP is normal.  Other labs are reassuring.  The patient had a couple episodes of brief desaturation to the 80s.  However, I suspect that he may have been asleep.  I ordered a D-dimer due to the hypoxia, although overall the clinical suspicion for PE is fairly low.  It is positive, so we will obtain a CT angio of the chest.  If the patient continues to have no episodes of hypoxia, a negative second troponin, and negative CT angio chest, he may be appropriate for discharge home.  I have signed the patient out to the oncoming ED physician Dr. Fuller Plan.  ____________________________________________   FINAL CLINICAL IMPRESSION(S) / ED DIAGNOSES  Final diagnoses:  Atypical chest pain      NEW MEDICATIONS STARTED  DURING THIS VISIT:  New Prescriptions   No medications on file     Note:  This document was prepared using Dragon voice recognition software and may include unintentional dictation errors.Newton Pigg, MD 04/19/21 2329

## 2021-04-19 NOTE — ED Notes (Signed)
Pt placed on 2L via [place] at this time due to desats to 88%. MD Siadecki aware, see new orders.

## 2021-04-19 NOTE — ED Notes (Signed)
Patient transported to CT 

## 2021-04-20 LAB — RESP PANEL BY RT-PCR (FLU A&B, COVID) ARPGX2
Influenza A by PCR: NEGATIVE
Influenza B by PCR: NEGATIVE
SARS Coronavirus 2 by RT PCR: NEGATIVE

## 2021-04-20 NOTE — ED Provider Notes (Signed)
12:57 AM Assumed care for off going team.   Blood pressure 123/88, pulse (!) 55, temperature 98.1 F (36.7 C), temperature source Oral, resp. rate 18, height 5\' 11"  (1.803 m), weight 82.1 kg, SpO2 96 %.  See their HPI for full report but in brief pending CT, troponin, COVID  COVID-negative, troponins negative x2.  CT scan is negative.  Patient did not has not been hypoxic is currently on room air satting 96%.  Therefore given reassuring work-up will discharge  I discussed the provisional nature of ED diagnosis, the treatment so far, the ongoing plan of care, follow up appointments and return precautions with the patient and any family or support people present. They expressed understanding and agreed with the plan, discharged home.           , MD 04/20/21 [phone]

## 2021-04-20 NOTE — ED Notes (Signed)
Ambulated patient in room while monitoring pulse ox.  Oxygen saturation remained 96% RA and above.  Patient denies SOB or feeling weak.  MD notified.

## 2021-04-20 NOTE — Discharge Instructions (Signed)
Your work-up was reassuring.  No evidence of heart attack.  Return to ER if develop worsening symptoms or any other concerns

## 2021-04-20 NOTE — ED Notes (Signed)
Nitro paste wiped from left chest.

## 2022-04-06 ENCOUNTER — Emergency Department: Payer: Medicare Other

## 2022-04-06 ENCOUNTER — Inpatient Hospital Stay
Admission: EM | Admit: 2022-04-06 | Discharge: 2022-04-10 | DRG: 308 | Disposition: A | Discharge status: Home / Self Care | Payer: Medicare Other | Attending: Internal Medicine | Admitting: Internal Medicine

## 2022-04-06 ENCOUNTER — Other Ambulatory Visit: Payer: Self-pay

## 2022-04-06 ENCOUNTER — Inpatient Hospital Stay (HOSPITAL_COMMUNITY)
Admit: 2022-04-06 | Discharge: 2022-04-06 | Disposition: A | Discharge status: Home / Self Care | Payer: Medicare Other | Attending: Nurse Practitioner | Admitting: Nurse Practitioner

## 2022-04-06 ENCOUNTER — Inpatient Hospital Stay: Payer: Medicare Other

## 2022-04-06 ENCOUNTER — Encounter: Payer: Self-pay | Admitting: Nurse Practitioner

## 2022-04-06 DIAGNOSIS — R778 Other specified abnormalities of plasma proteins: Secondary | ICD-10-CM | POA: Diagnosis present

## 2022-04-06 DIAGNOSIS — I712 Thoracic aortic aneurysm, without rupture, unspecified: Secondary | ICD-10-CM | POA: Diagnosis not present

## 2022-04-06 DIAGNOSIS — I5041 Acute combined systolic (congestive) and diastolic (congestive) heart failure: Secondary | ICD-10-CM | POA: Diagnosis not present

## 2022-04-06 DIAGNOSIS — Z91199 Patient's noncompliance with other medical treatment and regimen due to unspecified reason: Secondary | ICD-10-CM | POA: Diagnosis not present

## 2022-04-06 DIAGNOSIS — J929 Pleural plaque without asbestos: Secondary | ICD-10-CM | POA: Diagnosis not present

## 2022-04-06 DIAGNOSIS — Z91148 Patient's other noncompliance with medication regimen for other reason: Secondary | ICD-10-CM

## 2022-04-06 DIAGNOSIS — I340 Nonrheumatic mitral (valve) insufficiency: Secondary | ICD-10-CM | POA: Diagnosis not present

## 2022-04-06 DIAGNOSIS — I5023 Acute on chronic systolic (congestive) heart failure: Secondary | ICD-10-CM | POA: Diagnosis present

## 2022-04-06 DIAGNOSIS — Z8049 Family history of malignant neoplasm of other genital organs: Secondary | ICD-10-CM | POA: Diagnosis not present

## 2022-04-06 DIAGNOSIS — I5031 Acute diastolic (congestive) heart failure: Secondary | ICD-10-CM | POA: Diagnosis not present

## 2022-04-06 DIAGNOSIS — Z8249 Family history of ischemic heart disease and other diseases of the circulatory system: Secondary | ICD-10-CM | POA: Diagnosis not present

## 2022-04-06 DIAGNOSIS — I10 Essential (primary) hypertension: Secondary | ICD-10-CM | POA: Diagnosis present

## 2022-04-06 DIAGNOSIS — I4819 Other persistent atrial fibrillation: Secondary | ICD-10-CM | POA: Diagnosis not present

## 2022-04-06 DIAGNOSIS — Z7901 Long term (current) use of anticoagulants: Secondary | ICD-10-CM | POA: Diagnosis not present

## 2022-04-06 DIAGNOSIS — Z87891 Personal history of nicotine dependence: Secondary | ICD-10-CM | POA: Diagnosis not present

## 2022-04-06 DIAGNOSIS — J90 Pleural effusion, not elsewhere classified: Secondary | ICD-10-CM | POA: Diagnosis not present

## 2022-04-06 DIAGNOSIS — I77810 Thoracic aortic ectasia: Secondary | ICD-10-CM | POA: Diagnosis present

## 2022-04-06 DIAGNOSIS — I110 Hypertensive heart disease with heart failure: Secondary | ICD-10-CM | POA: Diagnosis present

## 2022-04-06 DIAGNOSIS — R0602 Shortness of breath: Secondary | ICD-10-CM | POA: Diagnosis not present

## 2022-04-06 DIAGNOSIS — I361 Nonrheumatic tricuspid (valve) insufficiency: Secondary | ICD-10-CM | POA: Diagnosis not present

## 2022-04-06 DIAGNOSIS — I428 Other cardiomyopathies: Secondary | ICD-10-CM | POA: Diagnosis present

## 2022-04-06 DIAGNOSIS — I959 Hypotension, unspecified: Secondary | ICD-10-CM | POA: Diagnosis present

## 2022-04-06 DIAGNOSIS — Z79899 Other long term (current) drug therapy: Secondary | ICD-10-CM | POA: Diagnosis not present

## 2022-04-06 DIAGNOSIS — I4891 Unspecified atrial fibrillation: Secondary | ICD-10-CM | POA: Diagnosis present

## 2022-04-06 DIAGNOSIS — R7989 Other specified abnormal findings of blood chemistry: Secondary | ICD-10-CM | POA: Diagnosis present

## 2022-04-06 DIAGNOSIS — Z888 Allergy status to other drugs, medicaments and biological substances status: Secondary | ICD-10-CM | POA: Diagnosis not present

## 2022-04-06 DIAGNOSIS — I081 Rheumatic disorders of both mitral and tricuspid valves: Secondary | ICD-10-CM | POA: Diagnosis present

## 2022-04-06 DIAGNOSIS — J984 Other disorders of lung: Secondary | ICD-10-CM | POA: Diagnosis not present

## 2022-04-06 DIAGNOSIS — I5022 Chronic systolic (congestive) heart failure: Secondary | ICD-10-CM

## 2022-04-06 DIAGNOSIS — R0789 Other chest pain: Secondary | ICD-10-CM | POA: Diagnosis not present

## 2022-04-06 DIAGNOSIS — I5043 Acute on chronic combined systolic (congestive) and diastolic (congestive) heart failure: Secondary | ICD-10-CM

## 2022-04-06 DIAGNOSIS — I509 Heart failure, unspecified: Secondary | ICD-10-CM | POA: Diagnosis not present

## 2022-04-06 DIAGNOSIS — Z720 Tobacco use: Secondary | ICD-10-CM

## 2022-04-06 LAB — ECHOCARDIOGRAM COMPLETE
AR max vel: 3.03 cm2
AV Area VTI: 3.03 cm2
AV Area mean vel: 2.97 cm2
AV Mean grad: 2 mmHg
AV Peak grad: 3.7 mmHg
Ao pk vel: 0.96 m/s
Area-P 1/2: 5.1 cm2
Calc EF: 29.9 %
Height: 69 in
MV M vel: 4.93 m/s
MV Peak grad: 97.2 mmHg
MV VTI: 1.91 cm2
S' Lateral: 4.66 cm
Single Plane A2C EF: 31.1 %
Single Plane A4C EF: 28.2 %
Weight: 2776.03 oz

## 2022-04-06 LAB — COMPREHENSIVE METABOLIC PANEL
ALT: 19 U/L (ref 0–44)
AST: 39 U/L (ref 15–41)
Albumin: 4.1 g/dL (ref 3.5–5.0)
Alkaline Phosphatase: 57 U/L (ref 38–126)
Anion gap: 9 (ref 5–15)
BUN: 15 mg/dL (ref 8–23)
CO2: 24 mmol/L (ref 22–32)
Calcium: 9.2 mg/dL (ref 8.9–10.3)
Chloride: 106 mmol/L (ref 98–111)
Creatinine, Ser: 0.83 mg/dL (ref 0.61–1.24)
GFR, Estimated: >60 mL/min (ref >60)
Glucose, Bld: 109 mg/dL — ABNORMAL HIGH (ref 70–99)
Potassium: 4.3 mmol/L (ref 3.5–5.1)
Sodium: 139 mmol/L (ref 135–145)
Total Bilirubin: 1.5 mg/dL — ABNORMAL HIGH (ref 0.3–1.2)
Total Protein: 8.2 g/dL — ABNORMAL HIGH (ref 6.5–8.1)

## 2022-04-06 LAB — BRAIN NATRIURETIC PEPTIDE: B Natriuretic Peptide: 1383.6 pg/mL — ABNORMAL HIGH (ref 0.0–100.0)

## 2022-04-06 LAB — APTT
aPTT: 32 seconds (ref 24–36)
aPTT: 71 seconds — ABNORMAL HIGH (ref 24–36)

## 2022-04-06 LAB — CBC
HCT: 38.1 % — ABNORMAL LOW (ref 39.0–52.0)
Hemoglobin: 13.7 g/dL (ref 13.0–17.0)
MCH: 31.6 pg (ref 26.0–34.0)
MCHC: 36 g/dL (ref 30.0–36.0)
MCV: 87.8 fL (ref 80.0–100.0)
Platelets: 214 10*3/uL (ref 150–400)
RBC: 4.34 MIL/uL (ref 4.22–5.81)
RDW: 12.5 % (ref 11.5–15.5)
WBC: 7.9 10*3/uL (ref 4.0–10.5)
nRBC: 0 % (ref 0.0–0.2)

## 2022-04-06 LAB — PROTIME-INR
INR: 1.1 (ref 0.8–1.2)
Prothrombin Time: 14.1 seconds (ref 11.4–15.2)

## 2022-04-06 LAB — HEPARIN LEVEL (UNFRACTIONATED): Heparin Unfractionated: 0.39 IU/mL (ref 0.30–0.70)

## 2022-04-06 LAB — TROPONIN I (HIGH SENSITIVITY)
Troponin I (High Sensitivity): 80 ng/L — ABNORMAL HIGH (ref <18)
Troponin I (High Sensitivity): 56 ng/L — ABNORMAL HIGH (ref <18)

## 2022-04-06 LAB — TSH: TSH: 0.619 u[IU]/mL (ref 0.350–4.500)

## 2022-04-06 MED ORDER — PERFLUTREN LIPID MICROSPHERE
1.0000 mL | INTRAVENOUS | Status: AC | PRN
  Administered 2022-04-06: 2 mL via INTRAVENOUS
  Filled 2022-04-06: qty 10

## 2022-04-06 MED ORDER — DILTIAZEM LOAD VIA INFUSION
10.0000 mg | Freq: Once | INTRAVENOUS | Status: AC
  Administered 2022-04-06: 10 mg via INTRAVENOUS
  Filled 2022-04-06: qty 10

## 2022-04-06 MED ORDER — DILTIAZEM HCL-DEXTROSE 125-5 MG/125ML-% IV SOLN (PREMIX)
5.0000 mg/h | INTRAVENOUS | Status: DC
  Administered 2022-04-06: 5 mg/h via INTRAVENOUS
  Filled 2022-04-06: qty 125

## 2022-04-06 MED ORDER — SODIUM CHLORIDE 0.9 % IV SOLN: 250.0000 mL | INTRAVENOUS | Status: DC | PRN

## 2022-04-06 MED ORDER — IOHEXOL 350 MG/ML SOLN
75.0000 mL | Freq: Once | INTRAVENOUS | Status: AC | PRN
  Administered 2022-04-06: 75 mL via INTRAVENOUS

## 2022-04-06 MED ORDER — ONDANSETRON HCL 4 MG PO TABS: 4.0000 mg | ORAL_TABLET | Freq: Four times a day (QID) | ORAL | Status: DC | PRN

## 2022-04-06 MED ORDER — ONDANSETRON HCL 4 MG/2ML IJ SOLN: 4.0000 mg | Freq: Four times a day (QID) | INTRAMUSCULAR | Status: DC | PRN

## 2022-04-06 MED ORDER — METOPROLOL SUCCINATE ER 50 MG PO TB24
75.0000 mg | ORAL_TABLET | Freq: Every day | ORAL | Status: DC
Start: 2022-04-06 — End: 2022-04-06

## 2022-04-06 MED ORDER — FUROSEMIDE 10 MG/ML IJ SOLN: 40.0000 mg | Freq: Two times a day (BID) | INTRAMUSCULAR | Status: DC

## 2022-04-06 MED ORDER — AMIODARONE HCL IN DEXTROSE 360-4.14 MG/200ML-% IV SOLN
60.0000 mg/h | INTRAVENOUS | Status: DC
  Filled 2022-04-06: qty 200

## 2022-04-06 MED ORDER — DAPAGLIFLOZIN PROPANEDIOL 10 MG PO TABS
10.0000 mg | ORAL_TABLET | Freq: Every day | ORAL | Status: DC
  Administered 2022-04-07 – 2022-04-10 (×4): 10 mg via ORAL
  Filled 2022-04-06 (×4): qty 1

## 2022-04-06 MED ORDER — AMIODARONE HCL 200 MG PO TABS: 200.0000 mg | ORAL_TABLET | Freq: Every day | ORAL | Status: DC

## 2022-04-06 MED ORDER — POTASSIUM CHLORIDE CRYS ER 20 MEQ PO TBCR
40.0000 meq | EXTENDED_RELEASE_TABLET | Freq: Every day | ORAL | Status: DC
  Administered 2022-04-06 – 2022-04-10 (×5): 40 meq via ORAL
  Filled 2022-04-06 (×5): qty 2

## 2022-04-06 MED ORDER — ACETAMINOPHEN 650 MG RE SUPP
650.0000 mg | Freq: Four times a day (QID) | RECTAL | Status: DC | PRN
Start: 2022-04-06 — End: 2022-04-10

## 2022-04-06 MED ORDER — HEPARIN (PORCINE) 25000 UT/250ML-% IV SOLN
1250.0000 [IU]/h | INTRAVENOUS | Status: DC
  Administered 2022-04-06 – 2022-04-07 (×2): 1200 [IU]/h via INTRAVENOUS
  Administered 2022-04-08 (×2): 1250 [IU]/h via INTRAVENOUS
  Filled 2022-04-06 (×4): qty 250

## 2022-04-06 MED ORDER — FUROSEMIDE 10 MG/ML IJ SOLN
40.0000 mg | Freq: Two times a day (BID) | INTRAMUSCULAR | Status: DC
Start: 2022-04-06 — End: 2022-04-10
  Administered 2022-04-06 – 2022-04-09 (×8): 40 mg via INTRAVENOUS
  Filled 2022-04-06 (×8): qty 4

## 2022-04-06 MED ORDER — AMIODARONE HCL IN DEXTROSE 360-4.14 MG/200ML-% IV SOLN: 30.0000 mg/h | INTRAVENOUS | Status: DC

## 2022-04-06 MED ORDER — HEPARIN BOLUS VIA INFUSION
4000.0000 [IU] | Freq: Once | INTRAVENOUS | Status: AC
  Administered 2022-04-06: 4000 [IU] via INTRAVENOUS
  Filled 2022-04-06: qty 4000

## 2022-04-06 MED ORDER — METOPROLOL TARTRATE 50 MG PO TABS
50.0000 mg | ORAL_TABLET | Freq: Two times a day (BID) | ORAL | Status: DC
  Administered 2022-04-06 – 2022-04-07 (×3): 50 mg via ORAL
  Filled 2022-04-06 (×3): qty 1

## 2022-04-06 MED ORDER — ACETAMINOPHEN 325 MG PO TABS
650.0000 mg | ORAL_TABLET | Freq: Four times a day (QID) | ORAL | Status: DC | PRN
  Administered 2022-04-07: 650 mg via ORAL
  Filled 2022-04-06: qty 2

## 2022-04-06 MED ORDER — SODIUM CHLORIDE 0.9% FLUSH
3.0000 mL | Freq: Two times a day (BID) | INTRAVENOUS | Status: DC
  Administered 2022-04-06 – 2022-04-10 (×8): 3 mL via INTRAVENOUS

## 2022-04-06 MED ORDER — AMIODARONE LOAD VIA INFUSION
150.0000 mg | Freq: Once | INTRAVENOUS | Status: AC
  Administered 2022-04-06: 150 mg via INTRAVENOUS
  Filled 2022-04-06: qty 83.34

## 2022-04-06 MED ORDER — SODIUM CHLORIDE 0.9% FLUSH: 3.0000 mL | INTRAVENOUS | Status: DC | PRN

## 2022-04-06 MED ORDER — APIXABAN 5 MG PO TABS: 5.0000 mg | ORAL_TABLET | Freq: Two times a day (BID) | ORAL | Status: DC

## 2022-04-06 NOTE — ED Provider Notes (Addendum)
? ?[facility] ?Provider Note ? ? ? Event Date/Time  ? First MD Initiated Contact with Patient 04/06/22 0744   ?  (approximate) ? ? ?History  ? ?Shortness of Breath ? ? ?HPI ? ?Donald Molina is a 71 y.o. male with a history of CHF, hypertension, nonischemic cardiomyopathy, atrial fibrillation reportedly on Eliquis, amiodarone Lasix losartan and Toprol and [place] who presents with shortness of breath.  Patient reports his breathing is much worse when he lies down.  He does report compliance with his medications.  No fevers chills or cough.  He reports this is worsened over the last 2 days review of medical records demonstrates he last saw his [facility] cardiologist on August 04, 2020, had been doing well at that time. ?  ? ? ?Physical Exam  ? ?Triage Vital Signs: ?ED Triage Vitals  ?Enc Vitals Group  ?   BP 04/06/22 0745 (!) 148/117  ?   Pulse Rate 04/06/22 0745 90  ?   Resp 04/06/22 0745 18  ?   Temp 04/06/22 0745 98.5 ?F (36.9 ?C)  ?   Temp Source 04/06/22 0745 Oral  ?   SpO2 04/06/22 0745 97 %  ?   Weight --   ?   Height --   ?   Head Circumference --   ?   Peak Flow --   ?   Pain Score 04/06/22 0746 0  ?   Pain Loc --   ?   Pain Edu? --   ?   Excl. in GC? --   ? ? ?Most recent vital signs: ?Vitals:  ? 04/06/22 0755 04/06/22 0800  ?BP:  (!) 159/111  ?Pulse: 83 (!) 124  ?Resp: (!) 25 (!) 30  ?Temp:    ?SpO2: 96% 96%  ? ? ? ?General: Awake, no distress.  ?CV:  Good peripheral perfusion.  ?Resp:  Increased effort with mild tachypnea, bibasilar Rales ?Abd:  No distention.  ?Other:  No significant lower edema ? ? ?ED Results / Procedures / Treatments  ? ?Labs ?(all labs ordered are listed, but only abnormal results are displayed) ?Labs Reviewed  ?CBC - Abnormal; Notable for the following components:  ?    Result Value  ? HCT 38.1 (*)   ? All other components within normal limits  ?COMPREHENSIVE METABOLIC PANEL - Abnormal; Notable for the following components:  ? Glucose, Bld 109 (*)   ? Total  Protein 8.2 (*)   ? Total Bilirubin 1.5 (*)   ? All other components within normal limits  ?TROPONIN I (HIGH SENSITIVITY) - Abnormal; Notable for the following components:  ? Troponin I (High Sensitivity) 80 (*)   ? All other components within normal limits  ?APTT  ?PROTIME-INR  ?BRAIN NATRIURETIC PEPTIDE  ? ? ? ?EKG ? ?ED ECG REPORT ?I, Jene Every, the attending physician, personally viewed and interpreted this ECG. ? ?Date: 04/06/2022 ? ?Rhythm: Atrial fibrillation with RVR ?QRS Axis: normal ?Intervals: Abnormal ?ST/T Wave abnormalities: Nonspecific ?Narrative Interpretation: no evidence of acute ischemia ? ? ? ?RADIOLOGY ?Chest x-ray interpreted by me, likely mild early pulmonary edema/congestion, pending radiology ? ? ? ?PROCEDURES: ? ?Critical Care performed: yes ? ?CRITICAL CARE ?Performed by: Jene Every ? ? ?Total critical care time: 30 minutes ? ?Critical care time was exclusive of separately billable procedures and treating other patients. ? ?Critical care was necessary to treat or prevent imminent or life-threatening deterioration. ? ?Critical care was time spent personally by me on the following activities:  development of treatment plan with patient and/or surrogate as well as nursing, discussions with consultants, evaluation of patient's response to treatment, examination of patient, obtaining history from patient or surrogate, ordering and performing treatments and interventions, ordering and review of laboratory studies, ordering and review of radiographic studies, pulse oximetry and re-evaluation of patient's condition. ? ? ?Procedures ? ? ?MEDICATIONS ORDERED IN ED: ?Medications  ?amiodarone (NEXTERONE PREMIX) 360-4.14 MG/200ML-% (1.8 mg/mL) IV infusion (has no administration in time range)  ?  Followed by  ?amiodarone (NEXTERONE PREMIX) 360-4.14 MG/200ML-% (1.8 mg/mL) IV infusion (has no administration in time range)  ?amiodarone (NEXTERONE) 1.8 mg/mL load via infusion 150 mg (150 mg  Intravenous Bolus from Bag 04/06/22 0820)  ? ? ? ?IMPRESSION / MDM / ASSESSMENT AND PLAN / ED COURSE  ?I reviewed the triage vital signs and the nursing notes. ? ?Patient presents with severe exacerbation of chronic illness. ? ?Patient with a history of CHF, nonischemic cardiomyopathy, reportedly chronic A-fib which apparently had been well controlled. ? ?His EKG is consistent with atrial fibrillation with RVR, I suspect this may be either leading to worsening CHF with likely pulmonary edema or perhaps CHF exacerbation is causing atrial fibrillation with RVR. ? ?Labs pending, chest x-ray suspicious for mild edema/congestion ? ?I discussed with cardiologist Dr. Mariah Milling who will see the patient in the emergency department.  Recommends amiodarone load and infusion for rate control ? ?Lab work reviewed, notable for elevated troponin of 80 likely related to strain from atrial fibrillation, otherwise labs are reassuring ? ?I discussed with Dr. Joylene Igo of the hospitalist service for admission ? ? ? ? ? ?  ? ? ?FINAL CLINICAL IMPRESSION(S) / ED DIAGNOSES  ? ?Final diagnoses:  ?Atrial fibrillation with RVR (HCC)  ?Acute on chronic congestive heart failure, unspecified heart failure type (HCC)  ? ? ? ?Rx / DC Orders  ? ?ED Discharge Orders   ? ? None  ? ?  ? ? ? ?Note:  This document was prepared using Dragon voice recognition software and may include unintentional dictation errors. ?  ?Jene Every, MD ?04/06/22 [phone] ? ?  ?Jene Every, MD ?04/06/22 [phone] ? ?

## 2022-04-06 NOTE — ED Notes (Signed)
Called dietary because pt never received dinner tray. Dinner tray is on its way. ?

## 2022-04-06 NOTE — Assessment & Plan Note (Signed)
Patient presents for evaluation of shortness of breath and orthopnea ?Noted to have significantly elevated blood pressure upon arrival to the ER ?Last known 2D echocardiogram shows an LVEF of 30 to 35% from 08/20 ?Place patient on Lasix 40 mg IV every 12 ?Optimize blood pressure control ?Continue metoprolol ?Patient not on an ACE/ARB or Entresto due to allergies ?Repeat 2D echocardiogram to assess LVEF ?

## 2022-04-06 NOTE — ED Notes (Signed)
Sent msg to provider to clarify lasix order for second 40mg  IV and inquire about PO potassium. ?

## 2022-04-06 NOTE — Consult Note (Signed)
? ?Cardiology Consult  ?  ?Patient ID: Donald Molina ?MRN: [id number], DOB/AGE: 1951/06/18  ? ?Admit date: 04/06/2022 ?Date of Consult: 04/06/2022 ? ?Primary Physician: Pcp, No ?Primary Cardiologist: Lorine Bears, MD ?Requesting Provider: Jamey Reas, MD ? ?Patient Profile  ?  ?Donald Molina is a 71 y.o. male with a history of HFrEF, NICM, persistent Afib (eliquis/amio), and HTN, who is being seen today for the evaluation of rapid afib at the request of Dr. Cyril Loosen. ? ?Past Medical History  ? ?Past Medical History:  ?Diagnosis Date  ? HFrEF (heart failure with reduced ejection fraction) (HCC)   ? a. 04/2018 Echo: EF 25-30%; b. 06/2018 Echo: EF 45-50%. diff HK, Gr1 DD. Nl RV fxn; c. 06/2019 Echo: EF 30-35%, diff HK, mod reduced RV fxn, mild BAE, mild-mod MR, mild PS.  ? Hypertension   ? NICM (nonischemic cardiomyopathy) (HCC)   ? a. 04/2018 Echo: EF 25-30%; b. 06/2018 Echo: EF 45-50%; c. 06/2019 Echo: EF 30-35%.  ? Persistent atrial fibrillation (HCC)   ? a. Dx 04/2018 s/p DCCV and amio loading. CHA2DS2VASc 3-->eliquis.  ?  ?Past Surgical History:  ?Procedure Laterality Date  ? CARDIAC CATHETERIZATION    ? RIGHT/LEFT HEART CATH AND CORONARY ANGIOGRAPHY N/A 05/05/2018  ? Procedure: RIGHT/LEFT HEART CATH AND CORONARY ANGIOGRAPHY;  Surgeon: Iran Ouch, MD;  Location: [facility] IN[facility]IVE CV [facility];  Service: Cardiovascular;  Laterality: N/A;  ? TEE WITHOUT CARDIOVERSION N/A 05/07/2018  ? Procedure: TRANSESOPHAGEAL ECHOCARDIOGRAM (TEE);  Surgeon: Yvonne Kendall, MD;  Location: [facility] ORS;  Service: Cardiovascular;  Laterality: N/A;  ?  ? ?Allergies ? ?Allergies  ?Allergen Reactions  ? Entresto [Sacubitril-Valsartan] Other (See Comments)  ?  hypotension  ? ? ?History of Present Illness  ?  ?71 y/o ? w/ the above PMH including chronic HFrEF, NICM, persistent Afib, and HTN.  In 04/2018, he was admitted w/ a several wk h/o dyspnea and found to be in afib w/ volume overload.  Echo showed and EF of 25-30%.  Cath showed nl cors.  Following  diuresis and amio loading, he underwent successful DCCV.  He had recurrent afib following d/c in the setting of noncompliance w/ meds and meds were resumed w/ subsequent conversion to sinus rhythm.  F/u echo in 06/2018 showed improved EF @ 45-50%.  Unfortunately, in 06/2019, he was admitted w/ dyspena, FUO, and recurrent Afib, in the setting of medication noncompliance.  EF was again found down @ 30-35% w/ diffuse HK.  He was diuresed and placed back on ? blocker and eliquis w/ plan for rhythm mgmt in the outpt setting.  At last cardiology f/u in 07/2020, he was in sinus rhythm.  He has since been lost to f/u. ? 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  He notes that until ~ 2 - 3 days ago, he has been doing well w/o symptoms or limitations.  He drinks a 40 oz beer about 2-3 x/wk.  He does not smoke cigarettes or use drugs.  When questioned about his medication compliance, he initially implied that he has his medicines, but doesn't always take them.  After further questioning, he admitted to not refilling his medications for at least a year.  Beginning 3 nights ago, he noted orthopnea, causing him to increase the number of pillows under his head.  Over the past 2 days, he has been experiencing marked DOE, which he notes as being similar to the way that he's felt when in afib in the past.  He does not weigh  himself @ home.  He denies lower ext edema, increase in abd girth, or early satiety.  He noted increased dyspnea while getting ready for work this AM, prompting him to present to the ED.  Here, he was found to be in afib @ 139.  BNP 1383.6.  HsTrop 80  56.  CXR w/ cardiomegaly and vascular congestion.  He was initially given an amiodarone bolus, however, once he admitted to noncompliance w/ eliquis, amio gtt was d/c'd.  He is currently mildly dyspneic @ rest w/o other complaints. ? ?Inpatient Medications  ?  ? [START ON 04/07/2022] dapagliflozin propanediol  10 mg Oral QAC breakfast  ? furosemide  40 mg  Intravenous BID WC  ? metoprolol tartrate  50 mg Oral BID  ? sodium chloride flush  3 mL Intravenous Q12H  ? ? ?Family History  ?  ?Family History  ?Problem Relation Age of Onset  ? Cancer Mother   ?     pancreatic  ? Cervical cancer Mother   ? Dementia Father   ? Heart failure Father   ? Heart disease Sister   ? ?He indicated that his mother is deceased. He indicated that his father is deceased. He indicated that the status of his sister is unknown. ? ? ?Social History  ?  ?Social History  ? ?Socioeconomic History  ? Marital status: Divorced  ?  Spouse name: Not on file  ? Number of children: 2  ? Years of education: 40  ? Highest education level: 12th grade  ?Occupational History  ? Occupation: retired  ?Tobacco Use  ? Smoking status: Former  ?  Years: 10.00  ?  Types: Cigarettes  ?  Quit date: 05/28/1998  ?  Years since quitting: 23.8  ? Smokeless tobacco: Former  ?  Types: Chew  ?  Quit date: 05/28/1998  ?Vaping Use  ? Vaping Use: Never used  ?Substance and Sexual Activity  ? Alcohol use: Yes  ?  Comment: 2-3, 40 oz beers/wk  ? Drug use: Never  ? Sexual activity: Yes  ?  Birth control/protection: Condom  ?Other Topics Concern  ? Not on file  ?Social History Narrative  ? 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  ? ?Social Determinants of Health  ? ?Financial Resource Strain: Not on file  ?Food Insecurity: Not on file  ?Transportation Needs: Not on file  ?Physical Activity: Not on file  ?Stress: Not on file  ?Social Connections: Not on file  ?Intimate Partner Violence: Not on file  ?  ? ?Review of Systems  ?  ?General:  No chills, fever, night sweats or weight changes.  ?Cardiovascular:  No chest pain, dyspnea on exertion, edema, orthopnea, palpitations, paroxysmal nocturnal dyspnea. ?Dermatological: No rash, lesions/masses ?Respiratory: No cough, dyspnea ?Urologic: No hematuria, dysuria ?Abdominal:   No nausea, vomiting, diarrhea, bright red blood per rectum, melena, or hematemesis ?Neurologic:   No visual changes, wkns, changes in mental status. ?All other systems reviewed and are otherwise negative except as noted above. ? ?Physical Exam  ?  ?Blood pressure 139/90, pulse 78, temperature 98.5 ?F (36.9 ?C), temperature source Oral, resp. rate (!) 33, height 5\' 9"  (1.753 m), weight 78.7 kg, SpO2 (!) 81 %.  ?General: Pleasant, NAD ?Psych: Normal affect. ?Neuro: Alert and oriented X 3. Moves all extremities spontaneously. ?HEENT: Normal  ?Neck: Supple without bruits or JVD. ?Lungs:  Resp regular and unlabored, diminished breath sounds bilat. ?Heart: IR, IR no s3, s4, or murmurs. ?Abdomen: Soft, non-tender,  non-distended, BS + x 4.  ?Extremities: No clubbing, cyanosis or edema. DP/PT2+, Radials 2+ and equal bilaterally. ? ?Labs  ?  ?Cardiac Enzymes ?Recent Labs  ?Lab 04/06/22 ?0754 04/06/22 ?1055  ?TROPONINIHS 80* 56*  ?   ?BNP ?   ?Component Value Date/Time  ? BNP 1,383.6 (H) 04/06/2022 0755  ? ?Lab Results  ?Component Value Date  ? WBC 7.9 04/06/2022  ? HGB 13.7 04/06/2022  ? HCT 38.1 (L) 04/06/2022  ? MCV 87.8 04/06/2022  ? PLT 214 04/06/2022  ?  ?Recent Labs  ?Lab 04/06/22 ?0754  ?NA 139  ?K 4.3  ?CL 106  ?CO2 24  ?BUN 15  ?CREATININE 0.83  ?CALCIUM 9.2  ?PROT 8.2*  ?BILITOT 1.5*  ?ALKPHOS 57  ?ALT 19  ?AST 39  ?GLUCOSE 109*  ? ?Lab Results  ?Component Value Date  ? CHOL 201 (H) 08/04/2020  ? HDL 63 08/04/2020  ? LDLCALC 119 (H) 08/04/2020  ? TRIG 110 08/04/2020  ? ?Lab Results  ?Component Value Date  ? DDIMER 0.66 (H) 04/19/2021  ?  ?Radiology Studies  ?  ?DG Chest Port 1 View ? ?Result Date: 04/06/2022 ?CLINICAL DATA:  Shortness of breath with chest tightness 2 days. EXAM: PORTABLE CHEST 1 VIEW COMPARISON:  04/19/2021 FINDINGS: Patient is slightly rotated to the left. Lungs are adequately inflated without focal airspace consolidation or effusion. Minimal prominence of the pulmonary vasculature which may be due to mild vascular congestion. Borderline cardiomegaly. Remainder of the exam is unchanged.  IMPRESSION: Borderline cardiomegaly with suggestion of minimal vascular congestion. Electronically Signed   By: Elberta Fortis M.D.   On: 04/06/2022 07:57   ? ?ECG & Cardiac Imaging  ?  ?Afib, 139, LAD - persona

## 2022-04-06 NOTE — ED Notes (Signed)
Per Dr Mariah Milling keep dilt drip at 5 mg/hr. ?

## 2022-04-06 NOTE — H&P (Signed)
?History and Physical  ? ? ?Patient: Donald Molina IZT:[id number] DOB: [birth year] ?DOA: 04/06/2022 ?DOS: the patient was seen and examined on 04/06/2022 ?PCP: Pcp, No  ?Patient coming from: Home ? ?Chief Complaint:  ?Chief Complaint  ?Patient presents with  ? Shortness of Breath  ? ?HPI: Donald Molina is a 71 y.o. male with medical history significant for nonischemic cardiomyopathy with last known LVEF of 30 to 35% from a 2D echocardiogram that was done 08/20, history of persistent atrial fibrillation on anticoagulation therapy, hypertension, chronic combined systolic and diastolic dysfunction CHF who presents to the ER for evaluation of a 2-day history of shortness of breath and chest tightness. ?Chest tightness was mostly over the left anterior chest wall, nonradiating and not associated with any nausea, vomiting or diaphoresis.  He complains of shortness of breath which is worse when he lays down.  He also has orthopnea but denies having any PND or leg swelling. ?He denies having any fever, no chills, no cough, no nausea, no vomiting, no abdominal pain, no changes in his bowel habits, no urinary symptoms, no headache, no dizziness or lightheadedness. ?Patient noted to be in rapid A-fib upon arrival to the ER and will be admitted to the hospital for further evaluation. ?Review of Systems: As mentioned in the history of present illness. All other systems reviewed and are negative. ?Past Medical History:  ?Diagnosis Date  ? HFrEF (heart failure with reduced ejection fraction) (HCC)   ? a. 04/2018 Echo: EF 25-30%; b. 06/2018 Echo: EF 45-50%. diff HK, Gr1 DD. Nl RV fxn; c. 06/2019 Echo: EF 30-35%, diff HK, mod reduced RV fxn, mild BAE, mild-mod MR, mild PS.  ? Hypertension   ? NICM (nonischemic cardiomyopathy) (HCC)   ? a. 04/2018 Echo: EF 25-30%; b. 06/2018 Echo: EF 45-50%; c. 06/2019 Echo: EF 30-35%.  ? Persistent atrial fibrillation (HCC)   ? a. Dx 04/2018 s/p DCCV and amio loading. CHA2DS2VASc 3-->eliquis.  ? ?Past  Surgical History:  ?Procedure Laterality Date  ? CARDIAC CATHETERIZATION    ? RIGHT/LEFT HEART CATH AND CORONARY ANGIOGRAPHY N/A 05/05/2018  ? Procedure: RIGHT/LEFT HEART CATH AND CORONARY ANGIOGRAPHY;  Surgeon: Iran Ouch, MD;  Location: [facility] INV[facility] CV [facility];  Service: Cardiovascular;  Laterality: N/A;  ? TEE WITHOUT CARDIOVERSION N/A 05/07/2018  ? Procedure: TRANSESOPHAGEAL ECHOCARDIOGRAM (TEE);  Surgeon: Yvonne Kendall, MD;  Location: [facility] ORS;  Service: Cardiovascular;  Laterality: N/A;  ? ?Social History:  reports that he quit smoking about 23 years ago. He quit smokeless tobacco use about 23 years ago.  His smokeless tobacco use included chew. He reports current alcohol use. He reports that he does not use drugs. ? ?Allergies  ?Allergen Reactions  ? Entresto [Sacubitril-Valsartan] Other (See Comments)  ?  hypotension  ? ? ?Family History  ?Problem Relation Age of Onset  ? Cancer Mother   ?     pancreatic  ? Cervical cancer Mother   ? Dementia Father   ? Heart failure Father   ? Heart disease Sister   ? ? ?Prior to Admission medications   ?Medication Sig Start Date End Date Taking? Authorizing Provider  ?amiodarone (PACERONE) 200 MG tablet Take 1 tablet (200 mg total) by mouth Donald. 09/12/20   Creig Hines, NP  ?apixaban (ELIQUIS) 5 MG TABS tablet Take 1 tablet (5 mg total) by mouth 2 (two) times Donald. 08/06/18   Creig Hines, NP  ?dapagliflozin propanediol (FARXIGA) 10 MG TABS tablet Take 1 tablet (10 mg total) by mouth Donald  before breakfast. 08/04/20   Sondra Barges, PA-C  ?furosemide (LASIX) 40 MG tablet Take 1 tablet (40 mg total) by mouth Donald. *NEEDS APPOINTMENT FOR FURTHER REFILLS-PLEASE CALL [phone] TO SCHEDULE.* 2ND ATTEMPT 10/21/19   Antonieta Iba, MD  ?losartan (COZAAR) 50 MG tablet TAKE 2 TABLETS BY MOUTH EVERY DAY 11/15/20   Sondra Barges, PA-C  ?metoprolol succinate (TOPROL-XL) 50 MG 24 hr tablet Take 1.5 tablets (75 mg total) by mouth Donald. 08/04/20 08/04/21   Sondra Barges, PA-C  ? ? ?Physical Exam: ?Vitals:  ? 04/06/22 0815 04/06/22 0830 04/06/22 0845 04/06/22 1000  ?BP:  (!) 132/119  (!) 146/128  ?Pulse: (!) 130 (!) 137 (!) 117 (!) 109  ?Resp: (!) 28 (!) 30 (!) 28   ?Temp:      ?TempSrc:      ?SpO2: 98% 97% 94% 97%  ? ?Physical Exam ?Vitals and nursing note reviewed.  ?Constitutional:   ?   Appearance: He is well-developed.  ?   Comments: Chronically ill-appearing  ?HENT:  ?   Head: Normocephalic and atraumatic.  ?   Mouth/Throat:  ?   Mouth: Mucous membranes are moist.  ?Eyes:  ?   Pupils: Pupils are equal, round, and reactive to light.  ?Cardiovascular:  ?   Rate and Rhythm: Tachycardia present. Rhythm irregular.  ?Pulmonary:  ?   Effort: Tachypnea present.  ?   Breath sounds: Examination of the right-lower field reveals decreased breath sounds and rales. Examination of the left-lower field reveals decreased breath sounds and rales. Decreased breath sounds and rales present.  ?Abdominal:  ?   General: Bowel sounds are normal.  ?   Palpations: Abdomen is soft.  ?Musculoskeletal:     ?   General: Normal range of motion.  ?   Cervical back: Normal range of motion and neck supple.  ?Skin: ?   General: Skin is warm and dry.  ?Neurological:  ?   General: No focal deficit present.  ?   Mental Status: He is alert.  ?Psychiatric:     ?   Mood and Affect: Mood normal.     ?   Behavior: Behavior normal.  ? ? ?Data Reviewed: ?Relevant notes from primary care and specialist visits, past discharge summaries as available in EHR, including Care Everywhere. ?Prior diagnostic testing as pertinent to current admission diagnoses ?Updated medications and problem lists for reconciliation ?ED course, including vitals, labs, imaging, treatment and response to treatment ?Triage notes, nursing and pharmacy notes and ED provider's notes ?Notable results as noted in HPI ?Labs reviewed.  BNP 1383.6, sodium 139, potassium 4.3, chloride 106, bicarb 24, glucose 109, BUN 15, creatinine 0.83, calcium  9.2, total protein 8.2, albumin 4.1, AST 39, ALT 19, troponin 80, PT 14.1, INR 1.1, white count 7.9, hemoglobin 13.7, hematocrit 38.1, RDW 12.5, platelet count 214 ?Chest x-ray reviewed by me shows borderline cardiomegaly with suggestion of minimal vascular congestion. ?Twelve-lead EKG reviewed by me shows atrial fibrillation with rapid ventricular rate.  Left axis deviation ?There are no new results to review at this time. ? ?Assessment and Plan: ?* Acute on chronic combined systolic and diastolic CHF (congestive heart failure) (HCC) ?Patient presents for evaluation of shortness of breath and orthopnea ?Noted to have significantly elevated blood pressure upon arrival to the ER ?Last known 2D echocardiogram shows an LVEF of 30 to 35% from 08/20 ?Place patient on Lasix 40 mg IV every 12 ?Optimize blood pressure control ?Continue metoprolol ?Patient not on an ACE/ARB or  Entresto due to allergies ?Repeat 2D echocardiogram to assess LVEF ? ?Atrial fibrillation with rapid ventricular response (HCC) ?Patient with a known history of persistent atrial fibrillation noted to have a rapid ventricular rate ?Continue Cardizem drip ?Continue metoprolol 50 mg p.o. twice Donald ?Obtain TSH levels ?Continue heparin drip initiated in the ER ? ?HTN (hypertension) ?Uncontrolled ?Continue metoprolol 50 mg twice Donald ?Uptitrate to optimize blood pressure control ? ?Elevated troponin ?Patient noted to have elevated troponin levels most likely secondary to demand ischemia from tachycardia ?We will cycle cardiac enzymes ?Continue heparin drip initiated in the ER ?Continue metoprolol ?Obtain 2D echocardiogram to assess LVEF and rule out regional wall motion abnormality ? ? ? ? ? Advance Care Planning:   Code Status: Full Code  ? ?Consults: Cardiology ? ?Family Communication: Greater than 50% of time was spent discussing patient's condition and plan of care with patient and his niece at the bedside.  All questions and concerns have been  addressed.  They verbalized understanding and agree with the plan. ? ?Severity of Illness: ?The appropriate patient status for this patient is INPATIENT. Inpatient status is judged to be reasonable and necess

## 2022-04-06 NOTE — Consult Note (Signed)
ANTICOAGULATION CONSULT NOTE  ? ?Pharmacy Consult for Heparin infusion ?Indication: atrial fibrillation ? ?Allergies  ?Allergen Reactions  ? Entresto [Sacubitril-Valsartan] Other (See Comments)  ?  hypotension  ? ? ?Patient Measurements: ?Height: 5\' 9"  (175.3 cm) ?Weight: 78.7 kg (173 lb 8 oz) ?IBW/kg (Calculated) : 70.7 ?Heparin Dosing Weight: 78.7 kg ? ?Vital Signs: ?Temp: 98.4 ?F (36.9 ?C) (05/12 2200) ?Temp Source: Oral (05/12 2200) ?BP: 133/103 (05/12 2200) ?Pulse Rate: 74 (05/12 2200) ? ?Labs: ?Recent Labs  ?  04/06/22 ?0754 04/06/22 ?1055 04/06/22 ?1900  ?HGB 13.7  --   --   ?HCT 38.1*  --   --   ?PLT 214  --   --   ?APTT 32  --  71*  ?LABPROT 14.1  --   --   ?INR 1.1  --   --   ?HEPARINUNFRC  --   --  0.39  ?CREATININE 0.83  --   --   ?TROPONINIHS 80* 56*  --   ? ? ? ?Estimated Creatinine Clearance: 81.6 mL/min (by C-G formula based on SCr of 0.83 mg/dL). ? ? ?Medical History: ?Past Medical History:  ?Diagnosis Date  ? HFrEF (heart failure with reduced ejection fraction) (HCC)   ? a. 04/2018 Echo: EF 25-30%; b. 06/2018 Echo: EF 45-50%. diff HK, Gr1 DD. Nl RV fxn; c. 06/2019 Echo: EF 30-35%, diff HK, mod reduced RV fxn, mild BAE, mild-mod MR, mild PS.  ? Hypertension   ? NICM (nonischemic cardiomyopathy) (HCC)   ? a. 04/2018 Echo: EF 25-30%; b. 06/2018 Echo: EF 45-50%; c. 06/2019 Echo: EF 30-35%.  ? Persistent atrial fibrillation (HCC)   ? a. Dx 04/2018 s/p DCCV and amio loading. CHA2DS2VASc 3-->eliquis.  ? ? ?Medications:  ?Scheduled:  ? [START ON 04/07/2022] dapagliflozin propanediol  10 mg Oral QAC breakfast  ? furosemide  40 mg Intravenous BID WC  ? metoprolol tartrate  50 mg Oral BID  ? potassium chloride  40 mEq Oral Daily  ? sodium chloride flush  3 mL Intravenous Q12H  ? ? ?Assessment: ?Patient with prior history of HFrEF, NICM, A.Fib, and HTN. Non compliant with his outpatient Eliquis or other medication prior to admission. Pharmacy consulted to manage heparin infusion for A.Fib. ? ?5/12 1900 HL 0.39 aPTT  71 ? ?Goal of Therapy:  ?Heparin level 0.3-0.7 units/ml ?aPTT  66-102 units/ml ?Monitor platelets by anticoagulation protocol: Yes ? ?Plan:  ?Heparin level is therapeutic and aPTT is therapeutic. Will use heparin level to monitor heparin infusion. Will continue heparin infusion at 1200 units/hr. Recheck heparin level in 8 hours. CBC daily while on heparin.  ? ?7/12, PharmD, BCPS ?04/06/2022 11:08 PM ? ? ?

## 2022-04-06 NOTE — ED Triage Notes (Addendum)
Pt presents to ED with c/o sob and chest tightness for the past two days. Reports hx of COPD. Denies inhaler or nebulizer use. Similar symptoms 2 years ago and states he was diagnosed with "the fibrillation". No obvious  increased work of breathing noted at this time. Skin warm and dry.  ?

## 2022-04-06 NOTE — Assessment & Plan Note (Signed)
Patient noted to have elevated troponin levels most likely secondary to demand ischemia from tachycardia ?We will cycle cardiac enzymes ?Continue heparin drip initiated in the ER ?Continue metoprolol ?Obtain 2D echocardiogram to assess LVEF and rule out regional wall motion abnormality ?

## 2022-04-06 NOTE — Consult Note (Addendum)
? ?  Heart Failure Nurse Navigator Note ? ?HFrEF 30 to 35%.  Mild to moderate left ventricular dilatation.  Moderately reduced right ventricular systolic function.  Mild biatrial enlargement, by echocardiogram performed in 2019.  Echocardiogram results pending on this admission. ? ?He presented to the emergency room with complaints of worsening shortness of breath.  Was noted to be in atrial fibrillation with RVR.  He also noted  PND,orthopnea and chest tightness. ? ?Comorbidities: ? ?Hypertension ?Nonischemic cardiomyopathy ?Persistent atrial fibrillation ? ?Medications: ? ?Furosemide 40 mg IV twice daily ?Farxiga 10 mg with breakfast ?Diltiazem infusion ? ?Labs. ? ?BNP 1383, sodium 139, potassium 4.3, chloride 106, CO2 24, BUN 15, creatinine 0.83 ?Weight is 78.7 kg ?Blood pressure 131/115 ? ?Initial meeting with patient in the ED, he was lying on a gurney in no acute distress remained in atrial fibrillation with rates in the upper 90s. ? ?He states that he had stopped taking his medications due to feeling so well.  But he states that he he knows that that is was a mistake now. ? ?He states that he lives with his brother.  Niece is currently at the bedside. ? ?He states that he has a scale but that he does not weigh himself on a daily basis.  Discussed the importance of daily weights and what to report. ? ?He states that he also eats some processed foods along with using salt at the table.  Discussed the reasoning behind removing the saltshaker from the table and eating foods that are lower in sodium.  Also went over importance of fluid restriction. ? ?Discussed the ventricle health program and was given a leaflet.  He would like to think about it at this time. ? ?He was given the living with heart failure teaching booklet, zone magnet, weight chart and printed information on heart failure and low sodium. ? ?He had no further questions.  He has an appointment on May 19 at 830.  He has a 19% of no-show which is 5 out  of 27 appointments. ? ?Visited later in the afternoon and he decided to go with the [facility] program.  Application sent. ? ?Tresa Endo RN CHFN ?

## 2022-04-06 NOTE — Progress Notes (Signed)
*  PRELIMINARY RESULTS* ?Echocardiogram ?2D Echocardiogram has been performed. ? ?Donald Molina ?04/06/2022, 2:47 PM ?

## 2022-04-06 NOTE — Assessment & Plan Note (Addendum)
Patient with a known history of persistent atrial fibrillation noted to have a rapid ventricular rate ?Continue Cardizem drip ?Continue metoprolol 50 mg p.o. twice daily ?Obtain TSH levels ?Continue heparin drip initiated in the ER ?

## 2022-04-06 NOTE — ED Notes (Signed)
Pt to CT for CTA now. ?

## 2022-04-06 NOTE — Consult Note (Addendum)
ANTICOAGULATION CONSULT NOTE - Initial Consult ? ?Pharmacy Consult for Heparin infusion ?Indication: atrial fibrillation ? ?Allergies  ?Allergen Reactions  ? Entresto [Sacubitril-Valsartan] Other (See Comments)  ?  hypotension  ? ? ?Patient Measurements: ?Height: 5\' 9"  (175.3 cm) ?Weight: 78.7 kg (173 lb 8 oz) ?IBW/kg (Calculated) : 70.7 ?Heparin Dosing Weight: 78.7 kg ? ?Vital Signs: ?Temp: 98.5 ?F (36.9 ?C) (05/12 0745) ?Temp Source: Oral (05/12 0745) ?BP: 146/128 (05/12 1000) ?Pulse Rate: 109 (05/12 1000) ? ?Labs: ?Recent Labs  ?  04/06/22 ?0754  ?HGB 13.7  ?HCT 38.1*  ?PLT 214  ?APTT 32  ?LABPROT 14.1  ?INR 1.1  ?CREATININE 0.83  ?TROPONINIHS 80*  ? ? ?Estimated Creatinine Clearance: 81.6 mL/min (by C-G formula based on SCr of 0.83 mg/dL). ? ? ?Medical History: ?Past Medical History:  ?Diagnosis Date  ? HFrEF (heart failure with reduced ejection fraction) (HCC)   ? a. 04/2018 Echo: EF 25-30%; b. 06/2018 Echo: EF 45-50%. diff HK, Gr1 DD. Nl RV fxn; c. 06/2019 Echo: EF 30-35%, diff HK, mod reduced RV fxn, mild BAE, mild-mod MR, mild PS.  ? Hypertension   ? NICM (nonischemic cardiomyopathy) (HCC)   ? a. 04/2018 Echo: EF 25-30%; b. 06/2018 Echo: EF 45-50%; c. 06/2019 Echo: EF 30-35%.  ? Persistent atrial fibrillation (HCC)   ? a. Dx 04/2018 s/p DCCV and amio loading. CHA2DS2VASc 3-->eliquis.  ? ? ?Medications:  ?Scheduled:  ? [START ON 04/07/2022] dapagliflozin propanediol  10 mg Oral QAC breakfast  ? furosemide  40 mg Intravenous Q12H  ? metoprolol tartrate  50 mg Oral BID  ? sodium chloride flush  3 mL Intravenous Q12H  ? ? ?Assessment: ?Patient with prior history of HFrEF, NICM, A.Fib, and HTN. Non compliant with his outpatient Eliquis or other medication prior to admission. Pharmacy consulted to manage heparin infusion for A.Fib. ? ?Goal of Therapy:  ?Heparin level 0.3-0.7 units/ml ?aPTT  66-102 units/ml ?Monitor platelets by anticoagulation protocol: Yes ? ?Plan:  ?Give 4000 units bolus x 1 ?Start heparin infusion at  1200 units/hr ?Check anti-Xa level in 8 hours and daily while on heparin ?Continue to monitor H&H and platelets ? ?Tandre Conly Rodriguez-Guzman PharmD, BCPS ?04/06/2022 10:51 AM ? ? ?

## 2022-04-06 NOTE — ED Notes (Signed)
Pt was reposition in bed, changed into a hospital gown, warm blankets given, side table next to pt so he may reach his drink and phone. No needs at this time, call bell in reach. Pt remains on cardiac monitoring devices.  ?

## 2022-04-06 NOTE — ED Notes (Signed)
Cardiologist and cardiology PA at bedside. Amiodarone was discontinued and other rate control drug will be ordered.  ?

## 2022-04-06 NOTE — Assessment & Plan Note (Signed)
Uncontrolled ?Continue metoprolol 50 mg twice daily ?Uptitrate to optimize blood pressure control ?

## 2022-04-06 NOTE — ED Notes (Signed)
Receiving RN Almyra Free  has agreed to resume care of pt once he has arrived to inpatient unit, all questions and concern address, nothing further. RN Liane Comber to transport pt on cardiac monitor and Cardizem drip  ?

## 2022-04-06 NOTE — ED Notes (Signed)
Contacted Dr Mariah Milling to inform of BP and HR trends and ask if still to keep on dilt drip for now. Pt still a fib but rate is mostly in 90s. ?

## 2022-04-06 NOTE — ED Notes (Signed)
Pt states has felt SOB for 2 days but states not SOB at this time. Labs have been sent. Pt in a fib RVR on monitor. Respirations are unlabored.  ?

## 2022-04-06 NOTE — ED Notes (Signed)
Spoke with attending provider re: pt continues to have RR in 30s but denies CP and has unlabored breathing. Has put off urine since lasix given. See new orders. ?

## 2022-04-06 NOTE — ED Notes (Signed)
Message sent to inpatient RN Almyra Free regarding pt's admission, bed is ready at this time.  ?

## 2022-04-07 DIAGNOSIS — I5043 Acute on chronic combined systolic (congestive) and diastolic (congestive) heart failure: Secondary | ICD-10-CM | POA: Diagnosis not present

## 2022-04-07 LAB — BASIC METABOLIC PANEL
Anion gap: 8 (ref 5–15)
BUN: 19 mg/dL (ref 8–23)
CO2: 24 mmol/L (ref 22–32)
Calcium: 9 mg/dL (ref 8.9–10.3)
Chloride: 105 mmol/L (ref 98–111)
Creatinine, Ser: 0.97 mg/dL (ref 0.61–1.24)
GFR, Estimated: >60 mL/min (ref >60)
Glucose, Bld: 110 mg/dL — ABNORMAL HIGH (ref 70–99)
Potassium: 4.4 mmol/L (ref 3.5–5.1)
Sodium: 137 mmol/L (ref 135–145)

## 2022-04-07 LAB — CBC
HCT: 36.3 % — ABNORMAL LOW (ref 39.0–52.0)
Hemoglobin: 13.1 g/dL (ref 13.0–17.0)
MCH: 31.5 pg (ref 26.0–34.0)
MCHC: 36.1 g/dL — ABNORMAL HIGH (ref 30.0–36.0)
MCV: 87.3 fL (ref 80.0–100.0)
Platelets: 191 10*3/uL (ref 150–400)
RBC: 4.16 MIL/uL — ABNORMAL LOW (ref 4.22–5.81)
RDW: 12.6 % (ref 11.5–15.5)
WBC: 8.9 10*3/uL (ref 4.0–10.5)
nRBC: 0 % (ref 0.0–0.2)

## 2022-04-07 LAB — HEPARIN LEVEL (UNFRACTIONATED)
Heparin Unfractionated: 0.3 IU/mL (ref 0.30–0.70)
Heparin Unfractionated: 0.4 IU/mL (ref 0.30–0.70)

## 2022-04-07 MED ORDER — METOPROLOL TARTRATE 50 MG PO TABS
50.0000 mg | ORAL_TABLET | Freq: Four times a day (QID) | ORAL | Status: DC
  Administered 2022-04-07 – 2022-04-09 (×7): 50 mg via ORAL
  Filled 2022-04-07 (×7): qty 1

## 2022-04-07 NOTE — Progress Notes (Signed)
?PROGRESS NOTE ? ? ? ?Donald Coit  [id number] DOB: Mar 28, 1951 DOA: 04/06/2022 ?PCP: Pcp, No  ? ? ?Brief Narrative:  ?Trendell Leahy is a 71 y.o. male with medical history significant for nonischemic cardiomyopathy with last known LVEF of 30 to 35% from a 2D echocardiogram that was done 08/20, history of persistent atrial fibrillation on anticoagulation therapy, hypertension, chronic combined systolic and diastolic dysfunction CHF who presents to the ER for evaluation of a 2-day history of shortness of breath and chest tightness. ?Chest tightness was mostly over the left anterior chest wall, nonradiating and not associated with any nausea, vomiting or diaphoresis.  He complains of shortness of breath which is worse when he lays down.  He also has orthopnea but denies having any PND or leg swelling. ?He denies having any fever, no chills, no cough, no nausea, no vomiting, no abdominal pain, no changes in his bowel habits, no urinary symptoms, no headache, no dizziness or lightheadedness. ?Patient noted to be in rapid A-fib upon arrival to the ER and will be admitted to the hospital for further evaluation. ? ?5/13 feeling better today less short of breath ? ?Consultants:  ?Cardiology ? ?Procedures:  ? ?Antimicrobials:  ?  ? ? ?Subjective: ?No chest pain.  Good urine output will ? ?Objective: ?Vitals:  ? 04/07/22 0300 04/07/22 0400 04/07/22 0500 04/07/22 0736  ?BP:  104/62  121/81  ?Pulse: 89 83 86 73  ?Resp: (!) 21 17 (!) 31 20  ?Temp:  98.5 ?F (36.9 ?C)  98.4 ?F (36.9 ?C)  ?TempSrc:  Oral  Oral  ?SpO2: 95% 96% 94% 96%  ?Weight:   77.9 kg   ?Height:   5\' 11"  (1.803 m)   ? ? ?Intake/Output Summary (Last 24 hours) at 04/07/2022 0821 ?Last data filed at 04/07/2022 0200 ?Gross per 24 hour  ?Intake --  ?Output 2165 ml  ?Net -2165 ml  ? ?Filed Weights  ? 04/06/22 1030 04/06/22 2030 04/07/22 0500  ?Weight: 78.7 kg 79 kg 77.9 kg  ? ? ?Examination: ?Calm, NAD ?decreased breath sounds at bases ?Reg s1/s2 no gallop ?Soft  benign +bs ?Mild le edema ?Aaoxox3  ?Mood and affect appropriate in current setting  ? ? ? ?Data Reviewed: I have personally reviewed following labs and imaging studies ? ?CBC: ?Recent Labs  ?Lab 04/06/22 ?[id number] 04/07/22 ?0527  ?WBC 7.9 8.9  ?HGB 13.7 13.1  ?HCT 38.1* 36.3*  ?MCV 87.8 87.3  ?PLT 214 191  ? ?Basic Metabolic Panel: ?Recent Labs  ?Lab 04/06/22 ?[id number] 04/07/22 ?0527  ?NA 139 137  ?K 4.3 4.4  ?CL 106 105  ?CO2 24 24  ?GLUCOSE 109* 110*  ?BUN 15 19  ?CREATININE 0.83 0.97  ?CALCIUM 9.2 9.0  ? ?GFR: ?Estimated Creatinine Clearance: 74.4 mL/min (by C-G formula based on SCr of 0.97 mg/dL). ?Liver Function Tests: ?Recent Labs  ?Lab 04/06/22 ?[id number]  ?AST 39  ?ALT 19  ?ALKPHOS 57  ?BILITOT 1.5*  ?PROT 8.2*  ?ALBUMIN 4.1  ? ?No results for input(s): LIPASE, AMYLASE in the last 168 hours. ?No results for input(s): AMMONIA in the last 168 hours. ?Coagulation Profile: ?Recent Labs  ?Lab 04/06/22 ?[id number]  ?INR 1.1  ? ?Cardiac Enzymes: ?No results for input(s): CKTOTAL, CKMB, CKMBINDEX, TROPONINI in the last 168 hours. ?BNP (last 3 results) ?No results for input(s): PROBNP in the last 8760 hours. ?HbA1C: ?No results for input(s): HGBA1C in the last 72 hours. ?CBG: ?No results for input(s): GLUCAP in the last 168 hours. ?Lipid Profile: ?No results for  input(s): CHOL, HDL, LDLCALC, TRIG, CHOLHDL, LDLDIRECT in the last 72 hours. ?Thyroid Function Tests: ?Recent Labs  ?  04/06/22 ?1011  ?TSH 0.619  ? ?Anemia Panel: ?No results for input(s): VITAMINB12, FOLATE, FERRITIN, TIBC, IRON, RETICCTPCT in the last 72 hours. ?Sepsis Labs: ?No results for input(s): PROCALCITON, LATICACIDVEN in the last 168 hours. ? ?No results found for this or any previous visit (from the past 240 hour(s)).  ? ? ? ? ? ?Radiology Studies: ?CT Angio Chest Pulmonary Embolism (PE) W or WO Contrast ? ?Result Date: 04/06/2022 ?CLINICAL DATA:  Chest tightness and shortness of breath 2 days. Possible pulmonary embolism. EXAM: CT ANGIOGRAPHY CHEST WITH CONTRAST  TECHNIQUE: Multidetector CT imaging of the chest was performed using the standard protocol during bolus administration of intravenous contrast. Multiplanar CT image reconstructions and MIPs were obtained to evaluate the vascular anatomy. RADIATION DOSE REDUCTION: This exam was performed according to the departmental dose-optimization program which includes automated exposure control, adjustment of the mA and/or kV according to patient size and/or use of iterative reconstruction technique. CONTRAST:  52mL OMNIPAQUE IOHEXOL 350 MG/ML SOLN COMPARISON:  04/19/2021 FINDINGS: Cardiovascular: Mild stable cardiomegaly. Ascending thoracic aorta measures 3.8 cm unchanged. Pulmonary arterial system is well opacified without evidence of emboli. Remaining vascular structures are unremarkable. Mediastinum/Nodes: 1.4 cm subcarinal lymph node likely reactive. Possible 1.4 cm right hilar lymph node. No left hilar adenopathy. Remainder of the mediastinum is unremarkable. Lungs/Pleura: Mild biapical pleural thickening with minimal biapical nodularity unchanged. Small right effusion and tiny amount left pleural fluid. Subtle hazy density over the right infrahilar region which may be due to asymmetric vascular congestion versus early infection or atelectasis. Remainder of the right lung as well as the left lung is clear. Airways are within normal. Upper Abdomen: Minimal cystic change over the visualized upper kidneys. No acute findings. Musculoskeletal: No focal abnormality. Review of the MIP images confirms the above findings. IMPRESSION: 1. No evidence of pulmonary embolism. 2. Subtle hazy density over the right infrahilar region which may be due to asymmetric vascular congestion versus early infection or atelectasis. Small right effusion and tiny amount of left pleural fluid. Mild reactive mediastinal and right hilar adenopathy. 3. Mild stable cardiomegaly. 4. Bilateral renal cysts. 5. Stable ectasia of the ascending thoracic aorta  measuring 3.8 cm. Recommend annual imaging followup by CTA or MRA. This recommendation follows 2010 ACCF/AHA/AATS/ACR/ASA/SCA/SCAI/SIR/STS/SVM Guidelines for the Diagnosis and Management of Patients with Thoracic Aortic Disease. Circulation.2010; 121: G644-I347. Aortic aneurysm NOS (ICD10-I71.9). Electronically Signed   By: Elberta Fortis M.D.   On: 04/06/2022 16:07  ? ?DG Chest Port 1 View ? ?Result Date: 04/06/2022 ?CLINICAL DATA:  Shortness of breath with chest tightness 2 days. EXAM: PORTABLE CHEST 1 VIEW COMPARISON:  04/19/2021 FINDINGS: Patient is slightly rotated to the left. Lungs are adequately inflated without focal airspace consolidation or effusion. Minimal prominence of the pulmonary vasculature which may be due to mild vascular congestion. Borderline cardiomegaly. Remainder of the exam is unchanged. IMPRESSION: Borderline cardiomegaly with suggestion of minimal vascular congestion. Electronically Signed   By: Elberta Fortis M.D.   On: 04/06/2022 07:57  ? ?ECHOCARDIOGRAM COMPLETE ? ?Result Date: 04/06/2022 ?   ECHOCARDIOGRAM REPORT   Patient Name:   KEVAUN Molina Date of Exam: 04/06/2022 Medical Rec #:  [id number]       Height:       69.0 in Accession #:    [id number]      Weight:       173.5 lb Date of Birth:  1950/12/02       BSA:          1.945 m? Patient Age:    31 years        BP:           120/82 mmHg Patient Gender: M               HR:           97 bpm. Exam Location:  [facility] Procedure: 2D Echo, Color Doppler, Cardiac Doppler and Intracardiac            Opacification Agent Indications:     I50.31 congestive heart failure-Acute Diastolic  History:         Patient has prior history of Echocardiogram examinations.                  HFrEF, Arrythmias:Atrial Fibrillation; Risk                  Factors:Hypertension.  Sonographer:     Charmayne Sheer Referring Phys:  [id number] Clance Boll BERGE Diagnosing Phys: Ida Rogue MD IMPRESSIONS  1. Left ventricular ejection fraction, by estimation, is 25%. The left  ventricle has severely decreased function. The left ventricle demonstrates global hypokinesis. There is mild left ventricular hypertrophy. Left ventricular diastolic parameters are indeterminate.  2. Right ve

## 2022-04-07 NOTE — Consult Note (Signed)
ANTICOAGULATION CONSULT NOTE  ? ?Pharmacy Consult for Heparin infusion ?Indication: atrial fibrillation ? ?Allergies  ?Allergen Reactions  ? Entresto [Sacubitril-Valsartan] Other (See Comments)  ?  hypotension  ? ? ?Patient Measurements: ?Height: 5\' 11"  (180.3 cm) ?Weight: 77.9 kg (171 lb 12.8 oz) ?IBW/kg (Calculated) : 75.3 ?Heparin Dosing Weight: 78.7 kg ? ?Vital Signs: ?Temp: 98.5 ?F (36.9 ?C) (05/13 0400) ?Temp Source: Oral (05/13 0400) ?BP: 104/62 (05/13 0400) ?Pulse Rate: 86 (05/13 0500) ? ?Labs: ?Recent Labs  ?  04/06/22 ?[id number] 04/06/22 ?1055 04/06/22 ?1900 04/07/22 ?0527  ?HGB 13.7  --   --  13.1  ?HCT 38.1*  --   --  36.3*  ?PLT 214  --   --  191  ?APTT 32  --  71*  --   ?LABPROT 14.1  --   --   --   ?INR 1.1  --   --   --   ?HEPARINUNFRC  --   --  0.39 0.30  ?CREATININE 0.83  --   --  0.97  ?TROPONINIHS 80* 56*  --   --   ? ? ? ?Estimated Creatinine Clearance: 74.4 mL/min (by C-G formula based on SCr of 0.97 mg/dL). ? ? ?Medical History: ?Past Medical History:  ?Diagnosis Date  ? HFrEF (heart failure with reduced ejection fraction) ([place])   ? a. 04/2018 Echo: EF 25-30%; b. 06/2018 Echo: EF 45-50%. diff HK, Gr1 DD. Nl RV fxn; c. 06/2019 Echo: EF 30-35%, diff HK, mod reduced RV fxn, mild BAE, mild-mod MR, mild PS.  ? Hypertension   ? NICM (nonischemic cardiomyopathy) ([place])   ? a. 04/2018 Echo: EF 25-30%; b. 06/2018 Echo: EF 45-50%; c. 06/2019 Echo: EF 30-35%.  ? Persistent atrial fibrillation ([place])   ? a. Dx 04/2018 s/p DCCV and amio loading. CHA2DS2VASc 3-->eliquis.  ? ? ?Medications:  ?Scheduled:  ? dapagliflozin propanediol  10 mg Oral QAC breakfast  ? furosemide  40 mg Intravenous BID WC  ? metoprolol tartrate  50 mg Oral BID  ? potassium chloride  40 mEq Oral Daily  ? sodium chloride flush  3 mL Intravenous Q12H  ? ? ?Assessment: ?Patient with prior history of HFrEF, NICM, A.Fib, and HTN. Non compliant with his outpatient Eliquis or other medication prior to admission. Pharmacy consulted to manage heparin  infusion for A.Fib. ? ?5/12 1900 HL 0.39 aPTT 71 ?5/13 1851 HL 0.30  ? ?Goal of Therapy:  ?Heparin level 0.3-0.7 units/ml ?aPTT  66-102 units/ml ?Monitor platelets by anticoagulation protocol: Yes ? ?Plan:  ?Heparin level is therapeutic but at the lower end of goal. Will increase heparin infusion to 1250 units/hr. Recheck heparin level and CBC with AM labs.  ? ?Eleonore Chiquito, PharmD, BCPS ?04/07/2022 6:32 AM ? ? ?

## 2022-04-07 NOTE — Progress Notes (Signed)
? ?Cardiology Progress Note  ? ?Patient Name: Donald Molina ?Date of Encounter: 04/07/2022 ? ?Primary Cardiologist: Lorine Bears, MD ? ?Subjective  ? ?Notes good UO yesterday.  Able to lie flat last night.  No chest pain, dyspnea, palpitations this AM.  Remains in Afib in the 70's to 90's.  EF 25% by echo. ? ?Inpatient Medications  ?  ?Scheduled Meds: ? dapagliflozin propanediol  10 mg Oral QAC breakfast  ? furosemide  40 mg Intravenous BID WC  ? metoprolol tartrate  50 mg Oral BID  ? potassium chloride  40 mEq Oral Daily  ? sodium chloride flush  3 mL Intravenous Q12H  ? ?Continuous Infusions: ? sodium chloride    ? diltiazem (CARDIZEM) infusion 5 mg/hr (04/06/22 1026)  ? heparin 1,250 Units/hr (04/07/22 0801)  ? ?PRN Meds: ?sodium chloride, acetaminophen **OR** acetaminophen, ondansetron **OR** ondansetron (ZOFRAN) IV, sodium chloride flush  ? ?Vital Signs  ?  ?Vitals:  ? 04/07/22 0300 04/07/22 0400 04/07/22 0500 04/07/22 0736  ?BP:  104/62  121/81  ?Pulse: 89 83 86 73  ?Resp: (!) 21 17 (!) 31 20  ?Temp:  98.5 ?F (36.9 ?C)  98.4 ?F (36.9 ?C)  ?TempSrc:  Oral  Oral  ?SpO2: 95% 96% 94% 96%  ?Weight:   77.9 kg   ?Height:   5\' 11"  (1.803 m)   ? ? ?Intake/Output Summary (Last 24 hours) at 04/07/2022 0934 ?Last data filed at 04/07/2022 0930 ?Gross per 24 hour  ?Intake --  ?Output 2815 ml  ?Net -2815 ml  ? ?Filed Weights  ? 04/06/22 1030 04/06/22 2030 04/07/22 0500  ?Weight: 78.7 kg 79 kg 77.9 kg  ? ? ?Physical Exam  ? ?GEN: Well nourished, well developed, in no acute distress.  ?HEENT: Grossly normal.  ?Neck: Supple, mod elevated JVP.  No carotid bruits or masses. ?Cardiac: IR, IR, no murmurs, rubs, or gallops. No clubbing, cyanosis, trace left ankle edema.  Radials 2+, DP/PT 2+ and equal bilaterally.  ?Respiratory:  Respirations regular and unlabored, few basilar crackles, otw, clear to auscultation bilaterally. ?GI: Soft, nontender, nondistended, BS + x 4. ?MS: no deformity or atrophy. ?Skin: warm and dry, no  rash. ?Neuro:  Strength and sensation are intact. ?Psych: AAOx3.  Normal affect. ? ?Labs  ?  ?Chemistry ?Recent Labs  ?Lab 04/06/22 ?0754 04/07/22 ?04/09/22  ?NA 139 137  ?K 4.3 4.4  ?CL 106 105  ?CO2 24 24  ?GLUCOSE 109* 110*  ?BUN 15 19  ?CREATININE 0.83 0.97  ?CALCIUM 9.2 9.0  ?PROT 8.2*  --   ?ALBUMIN 4.1  --   ?AST 39  --   ?ALT 19  --   ?ALKPHOS 57  --   ?BILITOT 1.5*  --   ?GFRNONAA >60 >60  ?ANIONGAP 9 8  ?  ? ?Hematology ?Recent Labs  ?Lab 04/06/22 ?0754 04/07/22 ?04/09/22  ?WBC 7.9 8.9  ?RBC 4.34 4.16*  ?HGB 13.7 13.1  ?HCT 38.1* 36.3*  ?MCV 87.8 87.3  ?MCH 31.6 31.5  ?MCHC 36.0 36.1*  ?RDW 12.5 12.6  ?PLT 214 191  ? ? ?Cardiac Enzymes  ?Recent Labs  ?Lab 04/06/22 ?0754 04/06/22 ?1055  ?TROPONINIHS 80* 56*  ?   ? ?BNP ?   ?Component Value Date/Time  ? BNP 1,383.6 (H) 04/06/2022 0755  ? ?Lipids  ?Lab Results  ?Component Value Date  ? CHOL 201 (H) 08/04/2020  ? HDL 63 08/04/2020  ? LDLCALC 119 (H) 08/04/2020  ? TRIG 110 08/04/2020  ? CHOLHDL 3.2 08/04/2020  ? ? ?  Radiology  ?  ?CT Angio Chest Pulmonary Embolism (PE) W or WO Contrast ? ?Result Date: 04/06/2022 ?CLINICAL DATA:  Chest tightness and shortness of breath 2 days. Possible pulmonary embolism. EXAM: CT ANGIOGRAPHY CHEST WITH CONTRAST TECHNIQUE: Multidetector CT imaging of the chest was performed using the standard protocol during bolus administration of intravenous contrast. Multiplanar CT image reconstructions and MIPs were obtained to evaluate the vascular anatomy. RADIATION DOSE REDUCTION: This exam was performed according to the departmental dose-optimization program which includes automated exposure control, adjustment of the mA and/or kV according to patient size and/or use of iterative reconstruction technique. CONTRAST:  26mL OMNIPAQUE IOHEXOL 350 MG/ML SOLN COMPARISON:  04/19/2021 FINDINGS: Cardiovascular: Mild stable cardiomegaly. Ascending thoracic aorta measures 3.8 cm unchanged. Pulmonary arterial system is well opacified without evidence of  emboli. Remaining vascular structures are unremarkable. Mediastinum/Nodes: 1.4 cm subcarinal lymph node likely reactive. Possible 1.4 cm right hilar lymph node. No left hilar adenopathy. Remainder of the mediastinum is unremarkable. Lungs/Pleura: Mild biapical pleural thickening with minimal biapical nodularity unchanged. Small right effusion and tiny amount left pleural fluid. Subtle hazy density over the right infrahilar region which may be due to asymmetric vascular congestion versus early infection or atelectasis. Remainder of the right lung as well as the left lung is clear. Airways are within normal. Upper Abdomen: Minimal cystic change over the visualized upper kidneys. No acute findings. Musculoskeletal: No focal abnormality. Review of the MIP images confirms the above findings. IMPRESSION: 1. No evidence of pulmonary embolism. 2. Subtle hazy density over the right infrahilar region which may be due to asymmetric vascular congestion versus early infection or atelectasis. Small right effusion and tiny amount of left pleural fluid. Mild reactive mediastinal and right hilar adenopathy. 3. Mild stable cardiomegaly. 4. Bilateral renal cysts. 5. Stable ectasia of the ascending thoracic aorta measuring 3.8 cm. Recommend annual imaging followup by CTA or MRA. This recommendation follows 2010 ACCF/AHA/AATS/ACR/ASA/SCA/SCAI/SIR/STS/SVM Guidelines for the Diagnosis and Management of Patients with Thoracic Aortic Disease. Circulation.2010; 121: L798-X211. Aortic aneurysm NOS (ICD10-I71.9). Electronically Signed   By: Elberta Fortis M.D.   On: 04/06/2022 16:07  ? ?DG Chest Port 1 View ? ?Result Date: 04/06/2022 ?CLINICAL DATA:  Shortness of breath with chest tightness 2 days. EXAM: PORTABLE CHEST 1 VIEW COMPARISON:  04/19/2021 FINDINGS: Patient is slightly rotated to the left. Lungs are adequately inflated without focal airspace consolidation or effusion. Minimal prominence of the pulmonary vasculature which may be due to  mild vascular congestion. Borderline cardiomegaly. Remainder of the exam is unchanged. IMPRESSION: Borderline cardiomegaly with suggestion of minimal vascular congestion. Electronically Signed   By: Elberta Fortis M.D.   On: 04/06/2022 07:57  ? ?Telemetry  ?  ?Afib, PVCs, 70's to 90's - Personally Reviewed ? ?Cardiac Studies  ? ?2D Echocardiogram 5.12.2023 ? ?1. Left ventricular ejection fraction, by estimation, is 25%. The left  ?ventricle has severely decreased function. The left ventricle demonstrates  ?global hypokinesis. There is mild left ventricular hypertrophy. Left  ?ventricular diastolic parameters are  ?indeterminate.  ? 2. Right ventricular systolic function is moderately reduced. The right  ?ventricular size is normal. Mildly increased right ventricular wall  ?thickness. There is severely elevated pulmonary artery systolic pressure.  ?The estimated right ventricular  ?systolic pressure is 64.0 mmHg.  ? 3. Left atrial size was moderately dilated.  ? 4. The mitral valve is normal in structure. Moderate to severe mitral  ?valve regurgitation. No evidence of mitral stenosis.  ? 5. Tricuspid valve regurgitation is moderate.  ?  6. The aortic valve is tricuspid. Aortic valve regurgitation is not  ?visualized. No aortic stenosis is present.  ? 7. The inferior vena cava is dilated in size with <50% respiratory  ?variability, suggesting right atrial pressure of 15 mmHg.  ? ?Patient Profile  ?   ?71 y.o. male with a history of HFrEF, NICM, persistent Afib (eliquis/amio), HTN, and noncompliance, who presented 5/12 w/ a 2-3 day h/o worsening DOE and orthopnea and was found to be in afib w/ RVR (noncompliant w/ home meds and f/u).  EF 25% by echo w/ mod-sev MR, mod TR, and RVSP . ? ?Assessment & Plan  ?  ?1.  Afib w/ RVR:  Pt w/ prior h/o persistent afib and associated LV dysfxn, who was prev on ? blocker and eliquis as outpt, but was lost to f/u and hasn't refilled meds in at least 1 year.  Admitted 5/12 due to  2-3 days of progressive dyspnea and orthopnea, and found to be in rapid afib.  Placed on IV dilt and oral metoprolol w/ good rate control - in the 70's to 90's this AM.  Feeling much better, good diuresis.  Christen Butter

## 2022-04-08 DIAGNOSIS — I5043 Acute on chronic combined systolic (congestive) and diastolic (congestive) heart failure: Secondary | ICD-10-CM | POA: Diagnosis not present

## 2022-04-08 LAB — BASIC METABOLIC PANEL
Anion gap: 12 (ref 5–15)
BUN: 22 mg/dL (ref 8–23)
CO2: 27 mmol/L (ref 22–32)
Calcium: 9.1 mg/dL (ref 8.9–10.3)
Chloride: 102 mmol/L (ref 98–111)
Creatinine, Ser: 1.22 mg/dL (ref 0.61–1.24)
GFR, Estimated: >60 mL/min (ref >60)
Glucose, Bld: 140 mg/dL — ABNORMAL HIGH (ref 70–99)
Potassium: 4.2 mmol/L (ref 3.5–5.1)
Sodium: 141 mmol/L (ref 135–145)

## 2022-04-08 LAB — CBC
HCT: 36.3 % — ABNORMAL LOW (ref 39.0–52.0)
Hemoglobin: 13 g/dL (ref 13.0–17.0)
MCH: 31.8 pg (ref 26.0–34.0)
MCHC: 35.8 g/dL (ref 30.0–36.0)
MCV: 88.8 fL (ref 80.0–100.0)
Platelets: 180 10*3/uL (ref 150–400)
RBC: 4.09 MIL/uL — ABNORMAL LOW (ref 4.22–5.81)
RDW: 12.6 % (ref 11.5–15.5)
WBC: 7 10*3/uL (ref 4.0–10.5)
nRBC: 0 % (ref 0.0–0.2)

## 2022-04-08 LAB — PROTIME-INR
INR: 1.1 (ref 0.8–1.2)
Prothrombin Time: 14.3 seconds (ref 11.4–15.2)

## 2022-04-08 LAB — HEPARIN LEVEL (UNFRACTIONATED): Heparin Unfractionated: 0.4 IU/mL (ref 0.30–0.70)

## 2022-04-08 MED ORDER — SODIUM CHLORIDE 0.9 % IV SOLN: INTRAVENOUS | Status: DC

## 2022-04-08 NOTE — Progress Notes (Signed)
? ?Progress Note ? ?Patient Name: Donald Molina ?Date of Encounter: 04/08/2022 ? ?[facility] Cardiologist: Lorine Bears, MD  ? ?Subjective  ? ?Doing well this AM. Rate controlled AF. Family in room. ? ?Inpatient Medications  ?  ?Scheduled Meds: ? dapagliflozin propanediol  10 mg Oral QAC breakfast  ? furosemide  40 mg Intravenous BID WC  ? metoprolol tartrate  50 mg Oral Q6H  ? potassium chloride  40 mEq Oral Daily  ? sodium chloride flush  3 mL Intravenous Q12H  ? ?Continuous Infusions: ? sodium chloride    ? heparin 1,250 Units/hr (04/08/22 0339)  ? ?PRN Meds: ?sodium chloride, acetaminophen **OR** acetaminophen, ondansetron **OR** ondansetron (ZOFRAN) IV, sodium chloride flush  ? ?Vital Signs  ?  ?Vitals:  ? 04/08/22 0400 04/08/22 0443 04/08/22 0727 04/08/22 1155  ?BP: (!) 114/92  107/76 106/86  ?Pulse: 79  85 92  ?Resp: 17  18 16   ?Temp: 97.9 ?F (36.6 ?C)  98.2 ?F (36.8 ?C) 98.1 ?F (36.7 ?C)  ?TempSrc: Oral  Oral   ?SpO2: 98%  100% 99%  ?Weight:  77.5 kg    ?Height:      ? ? ?Intake/Output Summary (Last 24 hours) at 04/08/2022 1209 ?Last data filed at 04/08/2022 1111 ?Gross per 24 hour  ?Intake 550.97 ml  ?Output 2500 ml  ?Net -1949.03 ml  ? ? ?  04/08/2022  ?  4:43 AM 04/07/2022  ?  5:00 AM 04/06/2022  ?  8:30 PM  ?Last 3 Weights  ?Weight (lbs) 170 lb 13.7 oz 171 lb 12.8 oz 174 lb 2.6 oz  ?Weight (kg) 77.5 kg 77.928 kg 79 kg  ?   ? ?Telemetry  ?  ?Rate controlled AF - Personally Reviewed ? ?ECG  ?  ? ?Physical Exam  ? ?GEN: No acute distress.   ?Neck: No JVD ?Cardiac: irregularly irregular, no murmurs, rubs, or gallops.  ?Respiratory: Clear to auscultation bilaterally. ?GI: Soft, nontender, non-distended  ?MS: No edema; No deformity. ?Neuro:  Nonfocal  ?Psych: Normal affect  ? ?Labs  ?  ?High Sensitivity Troponin:   ?Recent Labs  ?Lab 04/06/22 ?0754 04/06/22 ?1055  ?TROPONINIHS 80* 56*  ?   ?Chemistry ?Recent Labs  ?Lab 04/06/22 ?0754 04/07/22 ?2409 04/08/22 ?7353  ?NA 139 137 141  ?K 4.3 4.4 4.2  ?CL 106  105 102  ?CO2 24 24 27   ?GLUCOSE 109* 110* 140*  ?BUN 15 19 22   ?CREATININE 0.83 0.97 1.22  ?CALCIUM 9.2 9.0 9.1  ?PROT 8.2*  --   --   ?ALBUMIN 4.1  --   --   ?AST 39  --   --   ?ALT 19  --   --   ?ALKPHOS 57  --   --   ?BILITOT 1.5*  --   --   ?GFRNONAA >60 >60 >60  ?ANIONGAP 9 8 12   ?  ?Lipids No results for input(s): CHOL, TRIG, HDL, LABVLDL, LDLCALC, CHOLHDL in the last 168 hours.  ?Hematology ?Recent Labs  ?Lab 04/06/22 ?0754 04/07/22 ?2992 04/08/22 ?4268  ?WBC 7.9 8.9 7.0  ?RBC 4.34 4.16* 4.09*  ?HGB 13.7 13.1 13.0  ?HCT 38.1* 36.3* 36.3*  ?MCV 87.8 87.3 88.8  ?MCH 31.6 31.5 31.8  ?MCHC 36.0 36.1* 35.8  ?RDW 12.5 12.6 12.6  ?PLT 214 191 180  ? ?Thyroid  ?Recent Labs  ?Lab 04/06/22 ?1011  ?TSH 0.619  ?  ?BNP ?Recent Labs  ?Lab 04/06/22 ?0755  ?BNP 1,383.6*  ?  ?DDimer No results for input(s):  DDIMER in the last 168 hours.  ? ?Radiology  ?  ?CT Angio Chest Pulmonary Embolism (PE) W or WO Contrast ? ?Result Date: 04/06/2022 ?CLINICAL DATA:  Chest tightness and shortness of breath 2 days. Possible pulmonary embolism. EXAM: CT ANGIOGRAPHY CHEST WITH CONTRAST TECHNIQUE: Multidetector CT imaging of the chest was performed using the standard protocol during bolus administration of intravenous contrast. Multiplanar CT image reconstructions and MIPs were obtained to evaluate the vascular anatomy. RADIATION DOSE REDUCTION: This exam was performed according to the departmental dose-optimization program which includes automated exposure control, adjustment of the mA and/or kV according to patient size and/or use of iterative reconstruction technique. CONTRAST:  56mL OMNIPAQUE IOHEXOL 350 MG/ML SOLN COMPARISON:  04/19/2021 FINDINGS: Cardiovascular: Mild stable cardiomegaly. Ascending thoracic aorta measures 3.8 cm unchanged. Pulmonary arterial system is well opacified without evidence of emboli. Remaining vascular structures are unremarkable. Mediastinum/Nodes: 1.4 cm subcarinal lymph node likely reactive. Possible 1.4 cm  right hilar lymph node. No left hilar adenopathy. Remainder of the mediastinum is unremarkable. Lungs/Pleura: Mild biapical pleural thickening with minimal biapical nodularity unchanged. Small right effusion and tiny amount left pleural fluid. Subtle hazy density over the right infrahilar region which may be due to asymmetric vascular congestion versus early infection or atelectasis. Remainder of the right lung as well as the left lung is clear. Airways are within normal. Upper Abdomen: Minimal cystic change over the visualized upper kidneys. No acute findings. Musculoskeletal: No focal abnormality. Review of the MIP images confirms the above findings. IMPRESSION: 1. No evidence of pulmonary embolism. 2. Subtle hazy density over the right infrahilar region which may be due to asymmetric vascular congestion versus early infection or atelectasis. Small right effusion and tiny amount of left pleural fluid. Mild reactive mediastinal and right hilar adenopathy. 3. Mild stable cardiomegaly. 4. Bilateral renal cysts. 5. Stable ectasia of the ascending thoracic aorta measuring 3.8 cm. Recommend annual imaging followup by CTA or MRA. This recommendation follows 2010 ACCF/AHA/AATS/ACR/ASA/SCA/SCAI/SIR/STS/SVM Guidelines for the Diagnosis and Management of Patients with Thoracic Aortic Disease. Circulation.2010; 121: W098-J191. Aortic aneurysm NOS (ICD10-I71.9). Electronically Signed   By: Elberta Fortis M.D.   On: 04/06/2022 16:07  ? ?ECHOCARDIOGRAM COMPLETE ? ?Result Date: 04/06/2022 ?   ECHOCARDIOGRAM REPORT   Patient Name:   Donald Molina Date of Exam: 04/06/2022 Medical Rec #:  [id number]       Height:       69.0 in Accession #:    [id number]      Weight:       173.5 lb Date of Birth:  Sep 14, 1951       BSA:          1.945 m? Patient Age:    71 years        BP:           120/82 mmHg Patient Gender: M               HR:           97 bpm. Exam Location:  [facility] Procedure: 2D Echo, Color Doppler, Cardiac Doppler and Intracardiac             Opacification Agent Indications:     I50.31 congestive heart failure-Acute Diastolic  History:         Patient has prior history of Echocardiogram examinations.                  HFrEF, Arrythmias:Atrial Fibrillation; Risk  Factors:Hypertension.  Sonographer:     Humphrey Rolls Referring Phys:  [id number] Dois Davenport BERGE Diagnosing Phys: Julien Nordmann MD IMPRESSIONS  1. Left ventricular ejection fraction, by estimation, is 25%. The left ventricle has severely decreased function. The left ventricle demonstrates global hypokinesis. There is mild left ventricular hypertrophy. Left ventricular diastolic parameters are indeterminate.  2. Right ventricular systolic function is moderately reduced. The right ventricular size is normal. Mildly increased right ventricular wall thickness. There is severely elevated pulmonary artery systolic pressure. The estimated right ventricular systolic pressure is 64.0 mmHg.  3. Left atrial size was moderately dilated.  4. The mitral valve is normal in structure. Moderate to severe mitral valve regurgitation. No evidence of mitral stenosis.  5. Tricuspid valve regurgitation is moderate.  6. The aortic valve is tricuspid. Aortic valve regurgitation is not visualized. No aortic stenosis is present.  7. The inferior vena cava is dilated in size with <50% respiratory variability, suggesting right atrial pressure of 15 mmHg. FINDINGS  Left Ventricle: Left ventricular ejection fraction, by estimation, is 25 %. The left ventricle has severely decreased function. The left ventricle demonstrates global hypokinesis. Definity contrast agent was given IV to delineate the left ventricular endocardial borders. The left ventricular internal cavity size was normal in size. There is mild left ventricular hypertrophy. Left ventricular diastolic parameters are indeterminate. Right Ventricle: The right ventricular size is normal. Mildly increased right ventricular wall thickness. Right  ventricular systolic function is moderately reduced. There is severely elevated pulmonary artery systolic pressure. The tricuspid regurgitant velocity is 3.50 m/s, and with an assumed right atrial pres

## 2022-04-08 NOTE — Consult Note (Signed)
ANTICOAGULATION CONSULT NOTE  ? ?Pharmacy Consult for Heparin infusion ?Indication: atrial fibrillation ? ?Allergies  ?Allergen Reactions  ? Entresto [Sacubitril-Valsartan] Other (See Comments)  ?  hypotension  ? ? ?Patient Measurements: ?Height: 5\' 11"  (180.3 cm) ?Weight: 77.5 kg (170 lb 13.7 oz) ?IBW/kg (Calculated) : 75.3 ?Heparin Dosing Weight: 78.7 kg ? ?Vital Signs: ?Temp: 97.9 ?F (36.6 ?C) (05/14 0400) ?Temp Source: Oral (05/14 0400) ?BP: 114/92 (05/14 0400) ?Pulse Rate: 79 (05/14 0400) ? ?Labs: ?Recent Labs  ?  04/06/22 ?0754 04/06/22 ?1055 04/06/22 ?1900 04/06/22 ?1900 04/07/22 ?04/09/22 04/07/22 ?1112 04/08/22 ?04/10/22  ?HGB 13.7  --   --   --  13.1  --  13.0  ?HCT 38.1*  --   --   --  36.3*  --  36.3*  ?PLT 214  --   --   --  191  --  180  ?APTT 32  --  71*  --   --   --   --   ?LABPROT 14.1  --   --   --   --   --   --   ?INR 1.1  --   --   --   --   --   --   ?HEPARINUNFRC  --   --  0.39   < > 0.30 0.40 0.40  ?CREATININE 0.83  --   --   --  0.97  --  1.22  ?TROPONINIHS 80* 56*  --   --   --   --   --   ? < > = values in this interval not displayed.  ? ? ? ?Estimated Creatinine Clearance: 59.1 mL/min (by C-G formula based on SCr of 1.22 mg/dL). ? ? ?Medical History: ?Past Medical History:  ?Diagnosis Date  ? HFrEF (heart failure with reduced ejection fraction) (HCC)   ? a. 04/2018 Echo: EF 25-30%; b. 06/2018 Echo: EF 45-50%. diff HK, Gr1 DD. Nl RV fxn; c. 06/2019 Echo: EF 30-35%, diff HK, mod reduced RV fxn, mild BAE, mild-mod MR, mild PS.  ? Hypertension   ? NICM (nonischemic cardiomyopathy) (HCC)   ? a. 04/2018 Echo: EF 25-30%; b. 06/2018 Echo: EF 45-50%; c. 06/2019 Echo: EF 30-35%.  ? Persistent atrial fibrillation (HCC)   ? a. Dx 04/2018 s/p DCCV and amio loading. CHA2DS2VASc 3-->eliquis.  ? ? ?Medications:  ?Scheduled:  ? dapagliflozin propanediol  10 mg Oral QAC breakfast  ? furosemide  40 mg Intravenous BID WC  ? metoprolol tartrate  50 mg Oral Q6H  ? potassium chloride  40 mEq Oral Daily  ? sodium chloride  flush  3 mL Intravenous Q12H  ? ? ?Assessment: ?Patient with prior history of HFrEF, NICM, A.Fib, and HTN. Non compliant with his outpatient Eliquis or other medication prior to admission. Pharmacy consulted to manage heparin infusion for A.Fib. ? ?5/12 1900 HL 0.39 aPTT 71 ?5/13 1851 HL 0.30  ?5/14 0435 HL 0.4  ? ?Goal of Therapy:  ?Heparin level 0.3-0.7 units/ml ?aPTT  66-102 units/ml ?Monitor platelets by anticoagulation protocol: Yes ? ?Plan:  ?Heparin level is therapeutic. Will continue heparin infusion at 1250 units/hr. Recheck heparin level and CBC with AM labs.  ? ?6/14, PharmD, BCPS ?04/08/2022 5:53 AM ? ? ?

## 2022-04-08 NOTE — Progress Notes (Signed)
?PROGRESS NOTE ? ? ? ?Donald Molina  WOE:[id number] DOB: 03/24/1951 DOA: 04/06/2022 ?PCP: Pcp, No  ? ? ?Brief Narrative:  ?Donald Molina is a 71 y.o. male with medical history significant for nonischemic cardiomyopathy with last known LVEF of 30 to 35% from a 2D echocardiogram that was done 08/20, history of persistent atrial fibrillation on anticoagulation therapy, hypertension, chronic combined systolic and diastolic dysfunction CHF who presents to the ER for evaluation of a 2-day history of shortness of breath and chest tightness. ?Chest tightness was mostly over the left anterior chest wall, nonradiating and not associated with any nausea, vomiting or diaphoresis.  He complains of shortness of breath which is worse when he lays down.  He also has orthopnea but denies having any PND or leg swelling. ?He denies having any fever, no chills, no cough, no nausea, no vomiting, no abdominal pain, no changes in his bowel habits, no urinary symptoms, no headache, no dizziness or lightheadedness. ?Patient noted to be in rapid A-fib upon arrival to the ER and will be admitted to the hospital for further evaluation. ? ?5/13 feeling better today less short of breath ?5/14 has no complaints ? ?Consultants:  ?Cardiology ? ?Procedures:  ? ?Antimicrobials:  ?  ? ? ?Subjective: ?Denies chest pain, shortness of breath, or dizziness ? ? ? ?Objective: ?Vitals:  ? 04/08/22 0000 04/08/22 0400 04/08/22 0443 04/08/22 0727  ?BP: 110/75 (!) 114/92  107/76  ?Pulse: 88 79  85  ?Resp: 18 17  18   ?Temp: 98.2 ?F (36.8 ?C) 97.9 ?F (36.6 ?C)  98.2 ?F (36.8 ?C)  ?TempSrc: Oral Oral  Oral  ?SpO2: 95% 98%  100%  ?Weight:   77.5 kg   ?Height:      ? ? ?Intake/Output Summary (Last 24 hours) at 04/08/2022 0818 ?Last data filed at 04/08/2022 04/10/2022 ?Gross per 24 hour  ?Intake 790.97 ml  ?Output 2350 ml  ?Net -1559.03 ml  ? ?Filed Weights  ? 04/06/22 2030 04/07/22 0500 04/08/22 0443  ?Weight: 79 kg 77.9 kg 77.5 kg  ? ? ?Examination: ?Calm, NAD ?Cta no  w/r ?Irregular s1/s2 no gallop ?Soft benign +bs ?No edema ?Aaoxox3  ?Mood and affect appropriate in current setting  ? ? ? ?Data Reviewed: I have personally reviewed following labs and imaging studies ? ?CBC: ?Recent Labs  ?Lab 04/06/22 ?0754 04/07/22 ?04/09/22 04/08/22 ?04/10/22  ?WBC 7.9 8.9 7.0  ?HGB 13.7 13.1 13.0  ?HCT 38.1* 36.3* 36.3*  ?MCV 87.8 87.3 88.8  ?PLT 214 191 180  ? ?Basic Metabolic Panel: ?Recent Labs  ?Lab 04/06/22 ?0754 04/07/22 ?04/09/22 04/08/22 ?04/10/22  ?NA 139 137 141  ?K 4.3 4.4 4.2  ?CL 106 105 102  ?CO2 24 24 27   ?GLUCOSE 109* 110* 140*  ?BUN 15 19 22   ?CREATININE 0.83 0.97 1.22  ?CALCIUM 9.2 9.0 9.1  ? ?GFR: ?Estimated Creatinine Clearance: 59.1 mL/min (by C-G formula based on SCr of 1.22 mg/dL). ?Liver Function Tests: ?Recent Labs  ?Lab 04/06/22 ?0754  ?AST 39  ?ALT 19  ?ALKPHOS 57  ?BILITOT 1.5*  ?PROT 8.2*  ?ALBUMIN 4.1  ? ?No results for input(s): LIPASE, AMYLASE in the last 168 hours. ?No results for input(s): AMMONIA in the last 168 hours. ?Coagulation Profile: ?Recent Labs  ?Lab 04/06/22 ?0754  ?INR 1.1  ? ?Cardiac Enzymes: ?No results for input(s): CKTOTAL, CKMB, CKMBINDEX, TROPONINI in the last 168 hours. ?BNP (last 3 results) ?No results for input(s): PROBNP in the last 8760 hours. ?HbA1C: ?No results for input(s): HGBA1C in  the last 72 hours. ?CBG: ?No results for input(s): GLUCAP in the last 168 hours. ?Lipid Profile: ?No results for input(s): CHOL, HDL, LDLCALC, TRIG, CHOLHDL, LDLDIRECT in the last 72 hours. ?Thyroid Function Tests: ?Recent Labs  ?  04/06/22 ?1011  ?TSH 0.619  ? ?Anemia Panel: ?No results for input(s): VITAMINB12, FOLATE, FERRITIN, TIBC, IRON, RETICCTPCT in the last 72 hours. ?Sepsis Labs: ?No results for input(s): PROCALCITON, LATICACIDVEN in the last 168 hours. ? ?No results found for this or any previous visit (from the past 240 hour(s)).  ? ? ? ? ? ?Radiology Studies: ?CT Angio Chest Pulmonary Embolism (PE) W or WO Contrast ? ?Result Date: 04/06/2022 ?CLINICAL DATA:   Chest tightness and shortness of breath 2 days. Possible pulmonary embolism. EXAM: CT ANGIOGRAPHY CHEST WITH CONTRAST TECHNIQUE: Multidetector CT imaging of the chest was performed using the standard protocol during bolus administration of intravenous contrast. Multiplanar CT image reconstructions and MIPs were obtained to evaluate the vascular anatomy. RADIATION DOSE REDUCTION: This exam was performed according to the departmental dose-optimization program which includes automated exposure control, adjustment of the mA and/or kV according to patient size and/or use of iterative reconstruction technique. CONTRAST:  44mL OMNIPAQUE IOHEXOL 350 MG/ML SOLN COMPARISON:  04/19/2021 FINDINGS: Cardiovascular: Mild stable cardiomegaly. Ascending thoracic aorta measures 3.8 cm unchanged. Pulmonary arterial system is well opacified without evidence of emboli. Remaining vascular structures are unremarkable. Mediastinum/Nodes: 1.4 cm subcarinal lymph node likely reactive. Possible 1.4 cm right hilar lymph node. No left hilar adenopathy. Remainder of the mediastinum is unremarkable. Lungs/Pleura: Mild biapical pleural thickening with minimal biapical nodularity unchanged. Small right effusion and tiny amount left pleural fluid. Subtle hazy density over the right infrahilar region which may be due to asymmetric vascular congestion versus early infection or atelectasis. Remainder of the right lung as well as the left lung is clear. Airways are within normal. Upper Abdomen: Minimal cystic change over the visualized upper kidneys. No acute findings. Musculoskeletal: No focal abnormality. Review of the MIP images confirms the above findings. IMPRESSION: 1. No evidence of pulmonary embolism. 2. Subtle hazy density over the right infrahilar region which may be due to asymmetric vascular congestion versus early infection or atelectasis. Small right effusion and tiny amount of left pleural fluid. Mild reactive mediastinal and right hilar  adenopathy. 3. Mild stable cardiomegaly. 4. Bilateral renal cysts. 5. Stable ectasia of the ascending thoracic aorta measuring 3.8 cm. Recommend annual imaging followup by CTA or MRA. This recommendation follows 2010 ACCF/AHA/AATS/ACR/ASA/SCA/SCAI/SIR/STS/SVM Guidelines for the Diagnosis and Management of Patients with Thoracic Aortic Disease. Circulation.2010; 121: I786-V672. Aortic aneurysm NOS (ICD10-I71.9). Electronically Signed   By: Elberta Fortis M.D.   On: 04/06/2022 16:07  ? ?ECHOCARDIOGRAM COMPLETE ? ?Result Date: 04/06/2022 ?   ECHOCARDIOGRAM REPORT   Patient Name:   Donald Molina Date of Exam: 04/06/2022 Medical Rec #:  [id number]       Height:       69.0 in Accession #:    [id number]      Weight:       173.5 lb Date of Birth:  October 05, 1951       BSA:          1.945 m? Patient Age:    70 years        BP:           120/82 mmHg Patient Gender: M               HR:  97 bpm. Exam Location:  [facility] Procedure: 2D Echo, Color Doppler, Cardiac Doppler and Intracardiac            Opacification Agent Indications:     I50.31 congestive heart failure-Acute Diastolic  History:         Patient has prior history of Echocardiogram examinations.                  HFrEF, Arrythmias:Atrial Fibrillation; Risk                  Factors:Hypertension.  Sonographer:     Humphrey Rolls Referring Phys:  [id number] Dois Davenport BERGE Diagnosing Phys: Julien Nordmann MD IMPRESSIONS  1. Left ventricular ejection fraction, by estimation, is 25%. The left ventricle has severely decreased function. The left ventricle demonstrates global hypokinesis. There is mild left ventricular hypertrophy. Left ventricular diastolic parameters are indeterminate.  2. Right ventricular systolic function is moderately reduced. The right ventricular size is normal. Mildly increased right ventricular wall thickness. There is severely elevated pulmonary artery systolic pressure. The estimated right ventricular systolic pressure is 64.0 mmHg.  3. Left atrial  size was moderately dilated.  4. The mitral valve is normal in structure. Moderate to severe mitral valve regurgitation. No evidence of mitral stenosis.  5. Tricuspid valve regurgitation is moderate.  6. The

## 2022-04-09 ENCOUNTER — Encounter
Admission: EM | Disposition: A | Discharge status: Home / Self Care | Payer: Self-pay | Source: Home / Self Care | Attending: Internal Medicine

## 2022-04-09 ENCOUNTER — Inpatient Hospital Stay (HOSPITAL_COMMUNITY)
Admit: 2022-04-09 | Discharge: 2022-04-09 | Disposition: A | Discharge status: Home / Self Care | Payer: Medicare Other | Attending: Cardiology | Admitting: Cardiology

## 2022-04-09 ENCOUNTER — Inpatient Hospital Stay: Payer: Medicare Other | Admitting: Anesthesiology

## 2022-04-09 DIAGNOSIS — I340 Nonrheumatic mitral (valve) insufficiency: Secondary | ICD-10-CM | POA: Diagnosis not present

## 2022-04-09 DIAGNOSIS — I361 Nonrheumatic tricuspid (valve) insufficiency: Secondary | ICD-10-CM

## 2022-04-09 DIAGNOSIS — I5043 Acute on chronic combined systolic (congestive) and diastolic (congestive) heart failure: Secondary | ICD-10-CM | POA: Diagnosis not present

## 2022-04-09 DIAGNOSIS — I4891 Unspecified atrial fibrillation: Secondary | ICD-10-CM | POA: Diagnosis not present

## 2022-04-09 DIAGNOSIS — Z91148 Patient's other noncompliance with medication regimen for other reason: Secondary | ICD-10-CM

## 2022-04-09 HISTORY — PX: CARDIOVERSION: SHX1299

## 2022-04-09 HISTORY — PX: TEE WITHOUT CARDIOVERSION: SHX5443

## 2022-04-09 LAB — CBC
HCT: 37 % — ABNORMAL LOW (ref 39.0–52.0)
Hemoglobin: 13.6 g/dL (ref 13.0–17.0)
MCH: 31.9 pg (ref 26.0–34.0)
MCHC: 36.8 g/dL — ABNORMAL HIGH (ref 30.0–36.0)
MCV: 86.9 fL (ref 80.0–100.0)
Platelets: 179 10*3/uL (ref 150–400)
RBC: 4.26 MIL/uL (ref 4.22–5.81)
RDW: 12.1 % (ref 11.5–15.5)
WBC: 7.1 10*3/uL (ref 4.0–10.5)
nRBC: 0 % (ref 0.0–0.2)

## 2022-04-09 LAB — CREATININE, SERUM
Creatinine, Ser: 0.94 mg/dL (ref 0.61–1.24)
GFR, Estimated: >60 mL/min (ref >60)

## 2022-04-09 LAB — POTASSIUM: Potassium: 3.5 mmol/L (ref 3.5–5.1)

## 2022-04-09 LAB — HEPARIN LEVEL (UNFRACTIONATED): Heparin Unfractionated: 0.45 IU/mL (ref 0.30–0.70)

## 2022-04-09 SURGERY — CARDIOVERSION
Anesthesia: General

## 2022-04-09 SURGERY — ECHOCARDIOGRAM, TRANSESOPHAGEAL
Anesthesia: General

## 2022-04-09 MED ORDER — PROPOFOL 10 MG/ML IV BOLUS
INTRAVENOUS | Status: AC
  Filled 2022-04-09: qty 40

## 2022-04-09 MED ORDER — LIDOCAINE VISCOUS HCL 2 % MT SOLN
OROMUCOSAL | Status: AC
  Filled 2022-04-09: qty 15

## 2022-04-09 MED ORDER — AMIODARONE HCL 200 MG PO TABS
400.0000 mg | ORAL_TABLET | Freq: Two times a day (BID) | ORAL | Status: DC
  Administered 2022-04-09 – 2022-04-10 (×2): 400 mg via ORAL
  Filled 2022-04-09 (×2): qty 2

## 2022-04-09 MED ORDER — METOPROLOL SUCCINATE ER 100 MG PO TB24
100.0000 mg | ORAL_TABLET | Freq: Every day | ORAL | Status: DC
  Administered 2022-04-09 – 2022-04-10 (×2): 100 mg via ORAL
  Filled 2022-04-09 (×2): qty 1

## 2022-04-09 MED ORDER — PROPOFOL 10 MG/ML IV BOLUS
INTRAVENOUS | Status: DC | PRN
  Administered 2022-04-09: 50 mg via INTRAVENOUS
  Administered 2022-04-09 (×3): 20 mg via INTRAVENOUS

## 2022-04-09 MED ORDER — APIXABAN 5 MG PO TABS
5.0000 mg | ORAL_TABLET | Freq: Two times a day (BID) | ORAL | Status: DC
  Administered 2022-04-09 – 2022-04-10 (×2): 5 mg via ORAL
  Filled 2022-04-09 (×2): qty 1

## 2022-04-09 MED ORDER — SODIUM CHLORIDE 0.9 % IV SOLN: INTRAVENOUS | Status: DC

## 2022-04-09 NOTE — Progress Notes (Signed)
*  PRELIMINARY RESULTS* ?Echocardiogram ?Echocardiogram Transesophageal has been performed. ? ?Donald Molina, Donald Molina ?04/09/2022, 1:55 PM ?

## 2022-04-09 NOTE — Procedures (Signed)
Transesophageal Echocardiogram : ? ?Indication: atrial fib, cardiomyopathy ? ?Procedure: 10cc  of viscous lidocaine were given orally to provide local anesthesia to the oropharynx. The patient was positioned supine on the left side, bite block provided. The patient was sedated with propofol per anesthesia.  Using digital technique an omniplane probe was advanced into the esophagus without incident.  ? ?See report in EPIC  for complete details: ?In brief, imaging revealed severely reduced EF and no mural apical thrombus.  .  Estimated ejection fraction was 25%.  ? ?Imaging of the septum showed no ASD or VSD ?2D and color flow confirmed no PFO ? ?The LA was well visualized in orthogonal views.  There was spontaneous contrast but no thrombus in the LA and LA appendage  ? ?Cardioversion procedure note ?For atrial fibrillation. ? ?Procedure Details: ? ?Consent: Risks of procedure as well as the alternatives and risks of each were explained to the (patient/caregiver).  Consent for procedure obtained. ? ?Time Out: Verified patient identification, verified procedure, site/side was marked, verified correct patient position, special equipment/implants available, medications/allergies/relevent history reviewed, required imaging and test results available.  Performed ? ?Patient placed on cardiac monitor, pulse oximetry, supplemental oxygen as necessary.   ?Sedation given: propofol IV per anesthesia team ?Pacer pads placed anterior and posterior chest. ? ? ?Cardioverted 2 time(s).   ?Cardioverted at  200J. Synchronized biphasic ?Unsuccessful attempts x 2 ? ?Evaluation: ?Findings: Post procedure EKG shows: atrial fibrillation ?Complications: None ?Patient did tolerate procedure well. ? ?Time Spent Directly with the Patient: ? ?35 minutes  ? ?Aaron Edelman Agbor-Etang ?04/09/2022 ?1:50 PM  ?

## 2022-04-09 NOTE — Consult Note (Signed)
ANTICOAGULATION CONSULT NOTE  ? ?Pharmacy Consult for Heparin infusion ?Indication: atrial fibrillation ? ?Allergies  ?Allergen Reactions  ? Entresto [Sacubitril-Valsartan] Other (See Comments)  ?  hypotension  ? ? ?Patient Measurements: ?Height: 5\' 11"  (180.3 cm) ?Weight: 75.4 kg (166 lb 3.6 oz) ?IBW/kg (Calculated) : 75.3 ?Heparin Dosing Weight: 78.7 kg ? ?Vital Signs: ?Temp: 98.3 ?F (36.8 ?C) (05/15 1304) ?Temp Source: Oral (05/15 1304) ?BP: 122/92 (05/15 1438) ?Pulse Rate: 99 (05/15 1438) ? ?Labs: ?Recent Labs  ?  04/06/22 ?1900 04/06/22 ?1900 04/07/22 ?04/09/22 04/07/22 ?1112 04/08/22 ?04/10/22 04/08/22 ?1603 04/09/22 ?0450  ?HGB  --    < > 13.1  --  13.0  --  13.6  ?HCT  --   --  36.3*  --  36.3*  --  37.0*  ?PLT  --   --  191  --  180  --  179  ?APTT 71*  --   --   --   --   --   --   ?LABPROT  --   --   --   --   --  14.3  --   ?INR  --   --   --   --   --  1.1  --   ?HEPARINUNFRC 0.39  --  0.30 0.40 0.40  --  0.45  ?CREATININE  --   --  0.97  --  1.22  --  0.94  ? < > = values in this interval not displayed.  ? ? ? ?Estimated Creatinine Clearance: 76.8 mL/min (by C-G formula based on SCr of 0.94 mg/dL). ? ? ?Medical History: ?Past Medical History:  ?Diagnosis Date  ? HFrEF (heart failure with reduced ejection fraction) (HCC)   ? a. 04/2018 Echo: EF 25-30%; b. 06/2018 Echo: EF 45-50%. diff HK, Gr1 DD. Nl RV fxn; c. 06/2019 Echo: EF 30-35%, diff HK, mod reduced RV fxn, mild BAE, mild-mod MR, mild PS.  ? Hypertension   ? NICM (nonischemic cardiomyopathy) (HCC)   ? a. 04/2018 Echo: EF 25-30%; b. 06/2018 Echo: EF 45-50%; c. 06/2019 Echo: EF 30-35%.  ? Persistent atrial fibrillation (HCC)   ? a. Dx 04/2018 s/p DCCV and amio loading. CHA2DS2VASc 3-->eliquis.  ? ? ?Medications:  ?Scheduled:  ? amiodarone  400 mg Oral BID  ? apixaban  5 mg Oral BID  ? dapagliflozin propanediol  10 mg Oral QAC breakfast  ? furosemide  40 mg Intravenous BID WC  ? lidocaine      ? metoprolol succinate  100 mg Oral Daily  ? potassium chloride  40 mEq  Oral Daily  ? sodium chloride flush  3 mL Intravenous Q12H  ? ? ?Assessment: ?Patient with prior history of HFrEF, NICM, A.Fib, and HTN. Non compliant with his outpatient Eliquis or other medication prior to admission. Pharmacy consulted to manage heparin infusion for A.Fib. ? ?5/15: stopping heparin infusion and transitioning back to PTA Apixaban.  ? ?Goal of Therapy:  ?Monitor platelets by anticoagulation protocol: Yes ? ?Plan:  ?Apixaban 5 mg BID with first dose to be given when heparin infusion is stopped ?CBC daily ? ?6/15, PharmD ?Clinical Pharmacist ?04/09/2022 ?4:24 PM ? ? ? ?

## 2022-04-09 NOTE — Anesthesia Preprocedure Evaluation (Addendum)
Anesthesia Evaluation  ?Patient identified by MRN, date of birth, ID band ?Patient awake ? ? ? ?Reviewed: ?Allergy & Precautions, NPO status , Patient's Chart, lab work & pertinent test results ? ?History of Anesthesia Complications ?Negative for: history of anesthetic complications ? ?Airway ?Mallampati: IV ? ? ?Neck ROM: Full ? ? ? Dental ? ?(+) Missing, Chipped ?  ?Pulmonary ?former smoker (quit 1999),  ?  ?Pulmonary exam normal ?breath sounds clear to auscultation ? ? ? ? ? ? Cardiovascular ?hypertension, +CHF (NICM, EF 25-30%)  ?+ dysrhythmias (a fib on Eliquis)  ?Rhythm:Irregular Rate:Normal ? ?ECG 04/06/22:  ?Atrial fibrillation ?Left axis deviation ?Repolarization abnormality, prob rate related ?Baseline wander in lead(s) V5 ?  ?Neuro/Psych ?negative neurological ROS ?   ? GI/Hepatic ?negative GI ROS,   ?Endo/Other  ?negative endocrine ROS ? Renal/GU ?negative Renal ROS  ? ?  ?Musculoskeletal ? ? Abdominal ?  ?Peds ? Hematology ?negative hematology ROS ?(+)   ?Anesthesia Other Findings ? ? Reproductive/Obstetrics ? ?  ? ? ? ? ? ? ? ? ? ? ? ? ? ?  ?  ? ? ? ? ? ? ? ?Anesthesia Physical ?Anesthesia Plan ? ?ASA: 3 ? ?Anesthesia Plan: General  ? ?Post-op Pain Management:   ? ?Induction: Intravenous ? ?PONV Risk Score and Plan: 2 and Propofol infusion, TIVA and Treatment may vary due to age or medical condition ? ?Airway Management Planned: Natural Airway ? ?Additional Equipment:  ? ?Intra-op Plan:  ? ?Post-operative Plan:  ? ?Informed Consent: I have reviewed the patients History and Physical, chart, labs and discussed the procedure including the risks, benefits and alternatives for the proposed anesthesia with the patient or authorized representative who has indicated his/her understanding and acceptance.  ? ? ? ? ? ?Plan Discussed with: CRNA ? ?Anesthesia Plan Comments: (LMA/GETA backup discussed.  Patient consented for risks of anesthesia including but not limited to:  ?-  adverse reactions to medications ?- damage to eyes, teeth, lips or other oral mucosa ?- nerve damage due to positioning  ?- sore throat or hoarseness ?- damage to heart, brain, nerves, lungs, other parts of body or loss of life ? ?Informed patient about role of CRNA in peri- and intra-operative care.  Patient voiced understanding.)  ? ? ? ? ? ? ?Anesthesia Quick Evaluation ? ?

## 2022-04-09 NOTE — Progress Notes (Signed)
? ? ?Progress Note ? ?Patient Name: Donald Molina ?Date of Encounter: 04/09/2022 ? ?Primary Cardiologist: Kirke Corin ? ?Subjective  ? ?He remains in Afib with ventricular rates in the 80s bpm. He is for TEE-guided DCCV later today. Documented UOP 2.5 L for the past 24 hours, net - 5.9 L for the admission. No chest pain, dyspnea, or palpitations.  ? ?Inpatient Medications  ?  ?Scheduled Meds: ? dapagliflozin propanediol  10 mg Oral QAC breakfast  ? furosemide  40 mg Intravenous BID WC  ? metoprolol tartrate  50 mg Oral Q6H  ? potassium chloride  40 mEq Oral Daily  ? sodium chloride flush  3 mL Intravenous Q12H  ? ?Continuous Infusions: ? sodium chloride    ? sodium chloride 20 mL/hr at 04/09/22 0622  ? heparin 1,250 Units/hr (04/09/22 0622)  ? ?PRN Meds: ?sodium chloride, acetaminophen **OR** acetaminophen, ondansetron **OR** ondansetron (ZOFRAN) IV, sodium chloride flush  ? ?Vital Signs  ?  ?Vitals:  ? 04/08/22 2316 04/09/22 0500 04/09/22 0555 04/09/22 0813  ?BP: 116/81  110/76 (!) 120/98  ?Pulse: 90  83 74  ?Resp: 18  18 16   ?Temp: 98.2 ?F (36.8 ?C)  98.1 ?F (36.7 ?C) 97.9 ?F (36.6 ?C)  ?TempSrc: Oral  Oral   ?SpO2: 100%  100% 100%  ?Weight:  75.4 kg    ?Height:      ? ? ?Intake/Output Summary (Last 24 hours) at 04/09/2022 1105 ?Last data filed at 04/09/2022 0745 ?Gross per 24 hour  ?Intake 335.5 ml  ?Output 2540 ml  ?Net -2204.5 ml  ? ?Filed Weights  ? 04/07/22 0500 04/08/22 0443 04/09/22 0500  ?Weight: 77.9 kg 77.5 kg 75.4 kg  ? ? ?Telemetry  ?  ?Afib, 80s bpm - Personally Reviewed ? ?ECG  ?  ?No new tracings - Personally Reviewed ? ?Physical Exam  ? ?GEN: No acute distress.   ?Neck: No JVD. ?Cardiac: IRIR, II/VI systolic murmurs LSB, no rubs, or gallops.  ?Respiratory: Diminished breath sounds along the bases bilaterally.  ?GI: Soft, nontender, non-distended.   ?MS: No edema; No deformity. ?Neuro:  Alert and oriented x 3; Nonfocal.  ?Psych: Normal affect. ? ?Labs  ?  ?Chemistry ?Recent Labs  ?Lab 04/06/22 ?0754  04/07/22 ?04/09/22 04/08/22 ?04/10/22 04/09/22 ?0450  ?NA 139 137 141  --   ?K 4.3 4.4 4.2 3.5  ?CL 106 105 102  --   ?CO2 24 24 27   --   ?GLUCOSE 109* 110* 140*  --   ?BUN 15 19 22   --   ?CREATININE 0.83 0.97 1.22 0.94  ?CALCIUM 9.2 9.0 9.1  --   ?PROT 8.2*  --   --   --   ?ALBUMIN 4.1  --   --   --   ?AST 39  --   --   --   ?ALT 19  --   --   --   ?ALKPHOS 57  --   --   --   ?BILITOT 1.5*  --   --   --   ?GFRNONAA >60 >60 >60 >60  ?ANIONGAP 9 8 12   --   ?  ? ?Hematology ?Recent Labs  ?Lab 04/07/22 ? 04/08/22 ? 04/09/22 ?0450  ?WBC 8.9 7.0 7.1  ?RBC 4.16* 4.09* 4.26  ?HGB 13.1 13.0 13.6  ?HCT 36.3* 36.3* 37.0*  ?MCV 87.3 88.8 86.9  ?MCH 31.5 31.8 31.9  ?MCHC 36.1* 35.8 36.8*  ?RDW 12.6 12.6 12.1  ?PLT 191 180 179  ? ? ?Cardiac EnzymesNo results  for input(s): TROPONINI in the last 168 hours. No results for input(s): TROPIPOC in the last 168 hours.  ? ?BNP ?Recent Labs  ?Lab 04/06/22 ?0755  ?BNP 1,383.6*  ?  ? ?DDimer No results for input(s): DDIMER in the last 168 hours.  ? ?Radiology  ?  ?No results found. ? ?Cardiac Studies  ? ?2D echo 04/06/2022: ?1. Left ventricular ejection fraction, by estimation, is 25%. The left  ?ventricle has severely decreased function. The left ventricle demonstrates  ?global hypokinesis. There is mild left ventricular hypertrophy. Left  ?ventricular diastolic parameters are  ?indeterminate.  ? 2. Right ventricular systolic function is moderately reduced. The right  ?ventricular size is normal. Mildly increased right ventricular wall  ?thickness. There is severely elevated pulmonary artery systolic pressure.  ?The estimated right ventricular  ?systolic pressure is 64.0 mmHg.  ? 3. Left atrial size was moderately dilated.  ? 4. The mitral valve is normal in structure. Moderate to severe mitral  ?valve regurgitation. No evidence of mitral stenosis.  ? 5. Tricuspid valve regurgitation is moderate.  ? 6. The aortic valve is tricuspid. Aortic valve regurgitation is not  ?visualized. No aortic  stenosis is present.  ? 7. The inferior vena cava is dilated in size with <50% respiratory  ?variability, suggesting right atrial pressure of 15 mmHg. ? ?Patient Profile  ?   ?71 y.o. male with history of HFrEF secondary to NICM, persistent Afib (Eliquis/amio), HTN, and noncompliance, who presented 5/12 w/ a 2-3 day history of worsening DOE and orthopnea and was found to be in Afib w/ RVR (noncompliant w/ home meds and f/u).  EF 25% by echo w/ mod-sev MR, mod TR, and RVSP . ? ?Assessment & Plan  ?  ?1. Afib with RVR: ?-He remains in Afib with controlled ventricular response ?-Lopressor for now ?-Planning for TEE-guided DCCV today ?-Importance of medical adherence discussed  ?-Heparin gtt for now with recommendation to transition to DOAC prior to discharge  ? ?2. Acute on chronic HFrEF secondary to NICM: ?-Previously noted to develop a cardiomyopathy in 2019 when in Afib with LHC showing normal cors at that time ?-Improving ?-IV Lasix  ?-Continue short acting metoprolol for now with plans to transition to Toprol XL or Coreg prior to discharge give cardiomyopathy  ?-[place] ?-Escalate GDMT with ARB and MRA as tolerated (intolerant to Entresto due to hypotension) ?-Pursue repeat echo in several months in sinus rhythm and on maximally tolerated GDMT to reassess LVSF ?-CHF education  ? ?3. Demand ischemia: ?-High sensitivity troponin of 80 with a delta troponin of 56, felt to be in the setting of the above ?-Normal coronary arteries by cath in 04/2018 ?-No angina ?-No plans for inpatient ischemic evaluation at this time ?-Follow up as outpatient for further evaluation ? ?4. Valvular heart disease: ?-Moderate tricuspid regurgitation in setting of LV dysfunction, volume overload and RVSP of 64 mmHg ?-Moderate to severe mitral regurgitation ?-Diuresis and eventual rhythm management as above ?-Plan to reassess EF and valvular heart disease in 2-3 months ? ?5. HTN: ?-Blood pressure well controlled ?-Lopressor for now as  above ? ?6. Noncompliance: ?-Medical adherence is recommended  ? ? ?Shared Decision Making/Informed Consent{ ? ?The risks [stroke, cardiac arrhythmias rarely resulting in the need for a temporary or permanent pacemaker, skin irritation or burns, esophageal damage, perforation (1:10,000 risk), bleeding, pharyngeal hematoma as well as other potential complications associated with conscious sedation including aspiration, arrhythmia, respiratory failure and death], benefits (treatment guidance, restoration of normal sinus rhythm, diagnostic support)  and alternatives of a transesophageal echocardiogram guided cardioversion were discussed in detail with Mr. Casteen and he is willing to proceed.  ? ?For questions or updates, please contact [facility] ?Please consult [email or web address] for contact info under Cardiology/STEMI. ?  ? ?Signed, ?Eula Listen, PA-C ?[facility] ?Pager: [phone] ?04/09/2022, 11:05 AM ? ?

## 2022-04-09 NOTE — Care Management Important Message (Signed)
Important Message ? ?Patient Details  ?Name: Donald Molina ?MRN: [id number] ?Date of Birth: 03-05-1951 ? ? ?Medicare Important Message Given:  Yes ? ? ? ? ?Johnell Comings ?04/09/2022, 10:34 AM ?

## 2022-04-09 NOTE — Transfer of Care (Signed)
Immediate Anesthesia Transfer of Care Note ? ?Patient: Donald Molina ? ?Procedure(s) Performed: TRANSESOPHAGEAL ECHOCARDIOGRAM (TEE) ?CARDIOVERSION ? ?Patient Location: Endoscopy Unit ? ?Anesthesia Type:General ? ?Level of Consciousness: awake, alert  and oriented ? ?Airway & Oxygen Therapy: Patient Spontanous Breathing and Patient connected to nasal cannula oxygen ? ?Post-op Assessment: Report given to RN and Post -op Vital signs reviewed and stable ? ?Post vital signs: Reviewed and stable ? ?Last Vitals:  ?Vitals Value Taken Time  ?BP 134/89   ?Temp    ?Pulse 102   ?Resp 18   ?SpO2 99   ? ? ?Last Pain:  ?Vitals:  ? 04/09/22 1304  ?TempSrc: Oral  ?PainSc: 0-No pain  ?   ? ?  ? ?Complications: No notable events documented. ?

## 2022-04-09 NOTE — Anesthesia Procedure Notes (Signed)
Date/Time: 04/09/2022 1:24 PM ?Performed by: Joanette Gula, Legacy Carrender, CRNA ?Pre-anesthesia Checklist: Patient identified, Emergency Drugs available, Suction available, Patient being monitored and Timeout performed ?Patient Re-evaluated:Patient Re-evaluated prior to induction ?Oxygen Delivery Method: Nasal cannula ?Induction Type: IV induction ? ? ? ? ?

## 2022-04-09 NOTE — Consult Note (Signed)
ANTICOAGULATION CONSULT NOTE  ? ?Pharmacy Consult for Heparin infusion ?Indication: atrial fibrillation ? ?Allergies  ?Allergen Reactions  ? Entresto [Sacubitril-Valsartan] Other (See Comments)  ?  hypotension  ? ? ?Patient Measurements: ?Height: 5\' 11"  (180.3 cm) ?Weight: 77.5 kg (170 lb 13.7 oz) ?IBW/kg (Calculated) : 75.3 ?Heparin Dosing Weight: 78.7 kg ? ?Vital Signs: ?Temp: 98.2 ?F (36.8 ?C) (05/14 2316) ?Temp Source: Oral (05/14 2316) ?BP: 116/81 (05/14 2316) ?Pulse Rate: 90 (05/14 2316) ? ?Labs: ?Recent Labs  ?  04/06/22 ?0754 04/06/22 ?0754 04/06/22 ?1055 04/06/22 ?1900 04/07/22 ?[id number] 04/07/22 ?1112 04/08/22 ?[id number] 04/08/22 ?1603 04/09/22 ?0450  ?HGB 13.7  --   --   --  13.1  --  13.0  --  13.6  ?HCT 38.1*  --   --   --  36.3*  --  36.3*  --  37.0*  ?PLT 214  --   --   --  191  --  180  --  179  ?APTT 32  --   --  71*  --   --   --   --   --   ?LABPROT 14.1  --   --   --   --   --   --  14.3  --   ?INR 1.1  --   --   --   --   --   --  1.1  --   ?HEPARINUNFRC  --    < >  --  0.39 0.30 0.40 0.40  --  0.45  ?CREATININE 0.83  --   --   --  0.97  --  1.22  --  0.94  ?TROPONINIHS 80*  --  56*  --   --   --   --   --   --   ? < > = values in this interval not displayed.  ? ? ? ?Estimated Creatinine Clearance: 76.8 mL/min (by C-G formula based on SCr of 0.94 mg/dL). ? ? ?Medical History: ?Past Medical History:  ?Diagnosis Date  ? HFrEF (heart failure with reduced ejection fraction) ([place])   ? a. 04/2018 Echo: EF 25-30%; b. 06/2018 Echo: EF 45-50%. diff HK, Gr1 DD. Nl RV fxn; c. 06/2019 Echo: EF 30-35%, diff HK, mod reduced RV fxn, mild BAE, mild-mod MR, mild PS.  ? Hypertension   ? NICM (nonischemic cardiomyopathy) ([place])   ? a. 04/2018 Echo: EF 25-30%; b. 06/2018 Echo: EF 45-50%; c. 06/2019 Echo: EF 30-35%.  ? Persistent atrial fibrillation ([place])   ? a. Dx 04/2018 s/p DCCV and amio loading. CHA2DS2VASc 3-->eliquis.  ? ? ?Medications:  ?Scheduled:  ? dapagliflozin propanediol  10 mg Oral QAC breakfast  ? furosemide  40 mg  Intravenous BID WC  ? metoprolol tartrate  50 mg Oral Q6H  ? potassium chloride  40 mEq Oral Daily  ? sodium chloride flush  3 mL Intravenous Q12H  ? ? ?Assessment: ?Patient with prior history of HFrEF, NICM, A.Fib, and HTN. Non compliant with his outpatient Eliquis or other medication prior to admission. Pharmacy consulted to manage heparin infusion for A.Fib. ? ?5/12 1900 HL 0.39 aPTT 71 ?5/13 1851 HL 0.30  ?5/14 0435 HL 0.4  ?5/15 0450 HL 0.45  ? ?Goal of Therapy:  ?Heparin level 0.3-0.7 units/ml ?aPTT  66-102 units/ml ?Monitor platelets by anticoagulation protocol: Yes ? ?Plan:  ?Heparin level is therapeutic. Will continue heparin infusion at 1250 units/hr. Recheck heparin level and CBC with AM labs.  ? ?Eleonore Chiquito, PharmD, BCPS ?04/09/2022 5:38  AM ? ? ?

## 2022-04-09 NOTE — Anesthesia Postprocedure Evaluation (Signed)
Anesthesia Post Note ? ?Patient: Donald Molina ? ?Procedure(s) Performed: TRANSESOPHAGEAL ECHOCARDIOGRAM (TEE) ?CARDIOVERSION ? ?Patient location during evaluation: PACU ?Anesthesia Type: General ?Level of consciousness: awake and alert, oriented and patient cooperative ?Pain management: pain level controlled ?Vital Signs Assessment: post-procedure vital signs reviewed and stable ?Respiratory status: spontaneous breathing, nonlabored ventilation and respiratory function stable ?Cardiovascular status: blood pressure returned to baseline and stable ?Postop Assessment: adequate PO intake ?Anesthetic complications: no ? ? ?No notable events documented. ? ? ?Last Vitals:  ?Vitals:  ? 04/09/22 1400 04/09/22 1415  ?BP: 115/73 124/89  ?Pulse: 87 96  ?Resp: 19 18  ?Temp:    ?SpO2: 96% 96%  ?  ?Last Pain:  ?Vitals:  ? 04/09/22 1415  ?TempSrc:   ?PainSc: 0-No pain  ? ? ?  ?  ?  ?  ?  ?  ? ?Reed Breech ? ? ? ? ?

## 2022-04-09 NOTE — Progress Notes (Signed)
?PROGRESS NOTE ? ? ? ?Donald Molina  YKZ:[id number] DOB: 1951/10/12 DOA: 04/06/2022 ?PCP: Pcp, No  ? ? ?Brief Narrative:  ?Donald Molina is a 71 y.o. male with medical history significant for nonischemic cardiomyopathy with last known LVEF of 30 to 35% from a 2D echocardiogram that was done 08/20, history of persistent atrial fibrillation on anticoagulation therapy, hypertension, chronic combined systolic and diastolic dysfunction CHF who presents to the ER for evaluation of a 2-day history of shortness of breath and chest tightness. ?Chest tightness was mostly over the left anterior chest wall, nonradiating and not associated with any nausea, vomiting or diaphoresis.  He complains of shortness of breath which is worse when he lays down.  He also has orthopnea but denies having any PND or leg swelling. ?He denies having any fever, no chills, no cough, no nausea, no vomiting, no abdominal pain, no changes in his bowel habits, no urinary symptoms, no headache, no dizziness or lightheadedness. ?Patient noted to be in rapid A-fib upon arrival to the ER and will be admitted to the hospital for further evaluation. ? ?5/13 feeling better today less short of breath ?5/14 has no complaints ?5/15 plan for TEE cardioversion today ? ?Consultants:  ?Cardiology ? ?Procedures:  ? ?Antimicrobials:  ?  ? ? ?Subjective: ?Has no shortness of breath or chest pain ? ? ? ?Objective: ?Vitals:  ? 04/08/22 2316 04/09/22 0500 04/09/22 0555 04/09/22 0813  ?BP: 116/81  110/76 (!) 120/98  ?Pulse: 90  83 74  ?Resp: 18  18 16   ?Temp: 98.2 ?F (36.8 ?C)  98.1 ?F (36.7 ?C) 97.9 ?F (36.6 ?C)  ?TempSrc: Oral  Oral   ?SpO2: 100%  100% 100%  ?Weight:  75.4 kg    ?Height:      ? ? ?Intake/Output Summary (Last 24 hours) at 04/09/2022 0843 ?Last data filed at 04/09/2022 04/11/2022 ?Gross per 24 hour  ?Intake 335.5 ml  ?Output 2340 ml  ?Net -2004.5 ml  ? ?Filed Weights  ? 04/07/22 0500 04/08/22 0443 04/09/22 0500  ?Weight: 77.9 kg 77.5 kg 75.4 kg   ? ? ?Examination: ?Calm, NAD ?Cta no w/r ?Reg s1/s2 no gallop ?Soft benign +bs ?No edema ?Aaoxox3  ?Mood and affect appropriate in current setting  ? ? ? ?Data Reviewed: I have personally reviewed following labs and imaging studies ? ?CBC: ?Recent Labs  ?Lab 04/06/22 ?0754 04/07/22 ?04/09/22 04/08/22 ?04/10/22 04/09/22 ?0450  ?WBC 7.9 8.9 7.0 7.1  ?HGB 13.7 13.1 13.0 13.6  ?HCT 38.1* 36.3* 36.3* 37.0*  ?MCV 87.8 87.3 88.8 86.9  ?PLT 214 191 180 179  ? ?Basic Metabolic Panel: ?Recent Labs  ?Lab 04/06/22 ?0754 04/07/22 ?04/09/22 04/08/22 ?04/10/22 04/09/22 ?0450  ?NA 139 137 141  --   ?K 4.3 4.4 4.2  --   ?CL 106 105 102  --   ?CO2 24 24 27   --   ?GLUCOSE 109* 110* 140*  --   ?BUN 15 19 22   --   ?CREATININE 0.83 0.97 1.22 0.94  ?CALCIUM 9.2 9.0 9.1  --   ? ?GFR: ?Estimated Creatinine Clearance: 76.8 mL/min (by C-G formula based on SCr of 0.94 mg/dL). ?Liver Function Tests: ?Recent Labs  ?Lab 04/06/22 ?0754  ?AST 39  ?ALT 19  ?ALKPHOS 57  ?BILITOT 1.5*  ?PROT 8.2*  ?ALBUMIN 4.1  ? ?No results for input(s): LIPASE, AMYLASE in the last 168 hours. ?No results for input(s): AMMONIA in the last 168 hours. ?Coagulation Profile: ?Recent Labs  ?Lab 04/06/22 ?0754 04/08/22 ?1603  ?INR  1.1 1.1  ? ?Cardiac Enzymes: ?No results for input(s): CKTOTAL, CKMB, CKMBINDEX, TROPONINI in the last 168 hours. ?BNP (last 3 results) ?No results for input(s): PROBNP in the last 8760 hours. ?HbA1C: ?No results for input(s): HGBA1C in the last 72 hours. ?CBG: ?No results for input(s): GLUCAP in the last 168 hours. ?Lipid Profile: ?No results for input(s): CHOL, HDL, LDLCALC, TRIG, CHOLHDL, LDLDIRECT in the last 72 hours. ?Thyroid Function Tests: ?Recent Labs  ?  04/06/22 ?1011  ?TSH 0.619  ? ?Anemia Panel: ?No results for input(s): VITAMINB12, FOLATE, FERRITIN, TIBC, IRON, RETICCTPCT in the last 72 hours. ?Sepsis Labs: ?No results for input(s): PROCALCITON, LATICACIDVEN in the last 168 hours. ? ?No results found for this or any previous visit (from the past  240 hour(s)).  ? ? ? ? ? ?Radiology Studies: ?No results found. ? ? ? ? ? ?Scheduled Meds: ? dapagliflozin propanediol  10 mg Oral QAC breakfast  ? furosemide  40 mg Intravenous BID WC  ? metoprolol tartrate  50 mg Oral Q6H  ? potassium chloride  40 mEq Oral Daily  ? sodium chloride flush  3 mL Intravenous Q12H  ? ?Continuous Infusions: ? sodium chloride    ? sodium chloride 20 mL/hr at 04/09/22 0622  ? heparin 1,250 Units/hr (04/09/22 0622)  ? ? ?Assessment & Plan: ?  ?Principal Problem: ?  Acute on chronic combined systolic and diastolic CHF (congestive heart failure) (HCC) ?Active Problems: ?  Atrial fibrillation with rapid ventricular response (HCC) ?  HTN (hypertension) ?  Elevated troponin ? ? ?Acute on chronic combined systolic and diastolic CHF (congestive heart failure) (HCC) ?Echo with EF 25%, in setting of A-fib ?BNP elevated ?Continue IV diuretics ?Continue beta-blockers for now will need to be transition to long-acting prior to discharge ?If pressure allows they will add ARB ?He did not tolerate Entresto due to hypotension ?ppears more euvolemic on exam ?Cardiology following ?5/15 continue Farxiga, lasix, metoprolol ? ?  ?Atrial fibrillation with rapid ventricular response (HCC) ?Cards following ?5/15 TEE cardioversion today ?Amiodarone after DCCV ?Continue heparin with transition to Eliquis ? ? ?  ?HTN (hypertension) ?Stable on beta-blockers ?  ?Elevated troponin ?Likely due to demand ischemia ?Trending down ?Normal coronaries on cath in 2019 ?5/15 no chest pain ? ?DVT prophylaxis: Lovenox ?Code Status: Full ?Family Communication: Niece at bedside ?Disposition Plan: TBD ?Status is: Inpatient ?Remains inpatient appropriate because: IV treatment.  Plan for TEE cardioversion today ?  ? ? ? ? ? LOS: 3 days  ? ?Time spent: 35 minutes ? ? ? ?Lynn Ito, MD ?[facility] ?Pager [phone] ? ?If 7PM-7AM, please contact night-coverage ?04/09/2022, 8:43 AM   ?

## 2022-04-10 ENCOUNTER — Encounter: Payer: Self-pay | Admitting: Cardiology

## 2022-04-10 DIAGNOSIS — I5043 Acute on chronic combined systolic (congestive) and diastolic (congestive) heart failure: Secondary | ICD-10-CM | POA: Diagnosis not present

## 2022-04-10 DIAGNOSIS — I4891 Unspecified atrial fibrillation: Secondary | ICD-10-CM | POA: Diagnosis not present

## 2022-04-10 LAB — CBC
HCT: 40.3 % (ref 39.0–52.0)
Hemoglobin: 14.8 g/dL (ref 13.0–17.0)
MCH: 32.1 pg (ref 26.0–34.0)
MCHC: 36.7 g/dL — ABNORMAL HIGH (ref 30.0–36.0)
MCV: 87.4 fL (ref 80.0–100.0)
Platelets: 190 10*3/uL (ref 150–400)
RBC: 4.61 MIL/uL (ref 4.22–5.81)
RDW: 12.3 % (ref 11.5–15.5)
WBC: 7.8 10*3/uL (ref 4.0–10.5)
nRBC: 0 % (ref 0.0–0.2)

## 2022-04-10 MED ORDER — DAPAGLIFLOZIN PROPANEDIOL 10 MG PO TABS: 10.0000 mg | ORAL_TABLET | Freq: Every day | ORAL | 0 refills | Status: AC

## 2022-04-10 MED ORDER — AMIODARONE HCL 400 MG PO TABS: 400.0000 mg | ORAL_TABLET | Freq: Two times a day (BID) | ORAL | 0 refills | Status: DC

## 2022-04-10 MED ORDER — POTASSIUM CHLORIDE CRYS ER 20 MEQ PO TBCR: 40.0000 meq | EXTENDED_RELEASE_TABLET | Freq: Every day | ORAL | 0 refills | Status: DC

## 2022-04-10 MED ORDER — FUROSEMIDE 40 MG PO TABS
40.0000 mg | ORAL_TABLET | Freq: Two times a day (BID) | ORAL | 0 refills | Status: DC
Start: 2022-04-10 — End: 2022-06-15

## 2022-04-10 MED ORDER — FUROSEMIDE 40 MG PO TABS
40.0000 mg | ORAL_TABLET | Freq: Two times a day (BID) | ORAL | Status: DC
  Administered 2022-04-10: 40 mg via ORAL
  Filled 2022-04-10: qty 1

## 2022-04-10 MED ORDER — METOPROLOL SUCCINATE ER 100 MG PO TB24: 100.0000 mg | ORAL_TABLET | Freq: Every day | ORAL | 0 refills | Status: DC

## 2022-04-10 MED ORDER — APIXABAN 5 MG PO TABS: 5.0000 mg | ORAL_TABLET | Freq: Two times a day (BID) | ORAL | 0 refills | Status: DC

## 2022-04-10 NOTE — TOC Initial Note (Signed)
Transition of Care (TOC) - Initial/Assessment Note  ? ? ?Patient Details  ?Name: Donald Molina ?MRN: [id number] ?Date of Birth: Jan 20, 1951 ? ?Transition of Care (TOC) CM/SW Contact:    ?Truddie Hidden, RN ?Phone Number: ?04/10/2022, 2:44 PM ? ?Clinical Narrative:                 ?Patient admitted from home for Acute on chronic combined systolic and diastolic CHF  ?Patient has transportation home ?Denies any difficulty obtaining medications. Does not havea PCP. Agreeable to [facility] PCP. Patient agreeable to [facility]. Referral sent and accepted by Feliberto Gottron. Patient notified of initiation of services 5/19 ? ?Expected Discharge Plan: Home w Home Health Services ?Barriers to Discharge: Barriers Resolved ? ? ?Patient Goals and CMS Choice ?Patient states their goals for this hospitalization and ongoing recovery are:: To return home ?  ?  ? ?Expected Discharge Plan and Services ?Expected Discharge Plan: Home w Home Health Services ?  ?  ?  ?Living arrangements for the past 2 months: Single Family Home ?Expected Discharge Date: 04/10/22               ?  ?  ?  ?  ?  ?HH Arranged: RN ?HH Agency: [facility] ?Date HH Agency Contacted: 04/10/22 ?Time HH Agency Contacted: 1443 ?Representative spoke with at [facility] Agency: Feliberto Gottron ? ?Prior Living Arrangements/Services ?Living arrangements for the past 2 months: Single Family Home ?Lives with:: Self ?Patient language and need for interpreter reviewed:: Yes ?Do you feel safe going back to the place where you live?: Yes      ?Need for Family Participation in Patient Care: No (Comment) ?Care giver support system in place?: Yes (comment) ?  ?Criminal Activity/Legal Involvement Pertinent to Current Situation/Hospitalization: No - Comment as needed ? ?Activities of Daily Living ?Home Assistive Devices/Equipment: None ?ADL Screening (condition at time of admission) ?Patient's cognitive ability adequate to safely complete daily activities?: Yes ?Is the  patient deaf or have difficulty hearing?: No ?Does the patient have difficulty seeing, even when wearing glasses/contacts?: No ?Does the patient have difficulty concentrating, remembering, or making decisions?: No ?Patient able to express need for assistance with ADLs?: Yes ?Does the patient have difficulty dressing or bathing?: No ?Independently performs ADLs?: Yes (appropriate for developmental age) ?Does the patient have difficulty walking or climbing stairs?: No ?Weakness of Legs: None ?Weakness of Arms/Hands: None ? ?Permission Sought/Granted ?  ?  ?   ?   ?   ?   ? ?Emotional Assessment ?Appearance:: Other (Comment Required (Completed assessment by phone) ?Attitude/Demeanor/Rapport: Gracious, Engaged ?Affect (typically observed): Accepting ?Orientation: : Oriented to Self, Oriented to Place, Oriented to  Time, Oriented to Situation ?Alcohol / Substance Use: Not Applicable ?Psych Involvement: No (comment) ? ?Admission diagnosis:  Atrial fibrillation with rapid ventricular response (HCC) [I48.91] ?Atrial fibrillation with RVR (HCC) [I48.91] ?Acute on chronic congestive heart failure, unspecified heart failure type (HCC) [I50.9] ?Patient Active Problem List  ? Diagnosis Date Noted  ? Atrial fibrillation with rapid ventricular response (HCC) 04/06/2022  ? Elevated troponin 04/06/2022  ? Acute on chronic combined systolic and diastolic CHF (congestive heart failure) (HCC) 07/03/2019  ? HTN (hypertension) 05/29/2018  ? CHF (congestive heart failure) (HCC) 05/03/2018  ? Atrial fibrillation with RVR (HCC)   ? Acute pulmonary edema (HCC)   ? ?PCP:  Pcp, No ?Pharmacy:   ?[phone] Closed - [place], [place] - [address] ?[address] ?[place]  [id number] ?Phone: [phone] Fax: [phone] ? ?[id number] [place], [place] - [address] ?[address] ?[place] [address] ?Phone: [phone] Fax: [phone] ? ? ? ? ?Social Determinants of Health (SDOH) Interventions ?  ? ?Readmission Risk  Interventions ?   ? View : No data to display.  ?  ?  ?  ? ? ? ?

## 2022-04-10 NOTE — TOC Transition Note (Addendum)
Transition of Care (TOC) - CM/SW Discharge Note ? ? ?Patient Details  ?Name: Donald Molina ?MRN: [id number] ?Date of Birth: 1951/07/20 ? ?Transition of Care (TOC) CM/SW Contact:  ?Truddie Hidden, RN ?Phone Number: ?04/10/2022, 3:06 PM ? ? ?Clinical Narrative:    ?Patient PCP appointment arranged with [facility] 5/22  with Dr. Welton Flakes @ 1:15pm. [facility] will see patient for start of service 04/13/2022. MD an nurse notified. TOC signing off.  ? ? ?Final next level of care: Home w Home Health Services ?Barriers to Discharge: Barriers Resolved ? ? ?Patient Goals and CMS Choice ?Patient states their goals for this hospitalization and ongoing recovery are:: To return home ?  ?  ? ?Discharge Placement ?  ?           ?  ?  ?  ?  ? ?Discharge Plan and Services ?  ?  ?           ?  ?  ?  ?  ?  ?HH Arranged: RN ?HH Agency: [facility] ?Date HH Agency Contacted: 04/10/22 ?Time HH Agency Contacted: 1443 ?Representative spoke with at [facility] Agency: Feliberto Gottron ? ?Social Determinants of Health (SDOH) Interventions ?  ? ? ?Readmission Risk Interventions ?   ? View : No data to display.  ?  ?  ?  ? ? ? ? ? ?

## 2022-04-10 NOTE — Discharge Summary (Signed)
Donald Molina VCB:[id number] DOB: February 06, 1951 DOA: 04/06/2022 ? ?PCP: Pcp, No ? ?Admit date: 04/06/2022 ?Discharge date: 04/10/2022 ? ?Admitted From: home ?Disposition:  home ? ?Recommendations for Outpatient Follow-up:  ?Follow up with PCP in 1 week ?Please obtain BMP/CBC in one week ?Please follow up with cardiology in 2 weeks ? ?Home Health:yes  ? ? ?Discharge Condition:Stable ?CODE STATUS:full  ?Diet recommendation: Heart Healthy  ?Brief/Interim Summary: ?PER HPI: Donald Molina is a 71 y.o. male with medical history significant for nonischemic cardiomyopathy with last known LVEF of 30 to 35% from a 2D echocardiogram that was done 08/20, history of persistent atrial fibrillation on anticoagulation therapy, hypertension, chronic combined systolic and diastolic dysfunction CHF who presents to the ER for evaluation of a 2-day history of shortness of breath and chest tightness.Patient noted to be in rapid A-fib upon arrival to the ER and will be admitted to the hospital for further evaluation. Cardiology was consulted. He was also found with CHF and was started on iv lasix. Echo revealed low EF . He underwent  TEE-guided cardioversion on 04/09/22 which was unsuccessful in restoring sinus rhythm, prompting addition of amiodarone. He is stable to be discharged today with follow up cardiology for further management as outpatient. ? ? ?Acute on chronic combined systolic and diastolic CHF (congestive heart failure) (HCC) ?Echo with EF 25%, in setting of A-fib ?BNP elevated ?Started on IV Lasix transition to p.o. ?Euvolemic today ?Cardiology was following and recommended- ? Continue metoprolol succinate and dapagliflozin.  Given borderline low blood pressures and inconsistent follow-up in the past, we will defer adding an ARB and aldosterone antagonist at this time.  If blood pressure allows, this should be considered at follow-up.  ? ? ?  ?Atrial fibrillation with rapid ventricular response (HCC) ?5/15 underwent   unsuccessful TEE cardioversion ?plan to complete a 10 g load of amiodarone, after which time repeat cardioversion should be considered.  Donald Molina should remain on uninterrupted apixaban 5 mg twice daily ?  ?  ?  ?HTN (hypertension) ?Stable  ?  ?Elevated troponin ?Likely due to demand ischemia ?Normal coronaries on cath in 2019 ? ?Medical Noncompliance ?Medical adherence is recommended  ? ? ? ? ?Discharge Diagnoses:  ?Principal Problem: ?  Acute on chronic combined systolic and diastolic CHF (congestive heart failure) (HCC) ?Active Problems: ?  Atrial fibrillation with rapid ventricular response (HCC) ?  HTN (hypertension) ?  Elevated troponin ? ? ? ?Discharge Instructions ? ?Discharge Instructions   ? ? AMB referral to CHF clinic   Complete by: As directed ?  ? Call MD for:  difficulty breathing, headache or visual disturbances   Complete by: As directed ?  ? Diet - low sodium heart healthy   Complete by: As directed ?  ? Discharge instructions   Complete by: As directed ?  ? F/u with cardiology  ? Increase activity slowly   Complete by: As directed ?  ? ?  ? ?Allergies as of 04/10/2022   ? ?   Reactions  ? Entresto [sacubitril-valsartan] Other (See Comments)  ? hypotension  ? ?  ? ?  ?Medication List  ?  ? ?STOP taking these medications   ? ?losartan 50 MG tablet ?Commonly known as: COZAAR ?  ? ?  ? ?TAKE these medications   ? ?amiodarone 400 MG tablet ?Commonly known as: PACERONE ?Take 1 tablet (400 mg total) by mouth 2 (two) times daily for 14 days. ?What changed:  ?medication strength ?how much to take ?when to take  this ?  ?apixaban 5 MG Tabs tablet ?Commonly known as: ELIQUIS ?Take 1 tablet (5 mg total) by mouth 2 (two) times daily. ?  ?dapagliflozin propanediol 10 MG Tabs tablet ?Commonly known as: [place] ?Take 1 tablet (10 mg total) by mouth daily before breakfast. ?  ?furosemide 40 MG tablet ?Commonly known as: LASIX ?Take 1 tablet (40 mg total) by mouth 2 (two) times daily. ?What changed:  ?when to take  this ?additional instructions ?  ?metoprolol succinate 100 MG 24 hr tablet ?Commonly known as: TOPROL-XL ?Take 1 tablet (100 mg total) by mouth daily. Take with or immediately following a meal. ?Start taking on: Apr 11, 2022 ?What changed:  ?medication strength ?how much to take ?additional instructions ?  ?potassium chloride SA 20 MEQ tablet ?Commonly known as: KLOR-CON M ?Take 2 tablets (40 mEq total) by mouth daily. ?Start taking on: Apr 11, 2022 ?  ? ?  ? ? Follow-up Information   ? ? Iran Ouch, MD Follow up in 2 week(s).   ?Specialty: Cardiology ?Contact information: ?[address] ?[address] ?[place] [address] ?[phone] ? ? ?  ?  ? ?  ?  ? ?  ? ?Allergies  ?Allergen Reactions  ? Entresto [Sacubitril-Valsartan] Other (See Comments)  ?  hypotension  ? ? ?Consultations: ?Cardiology ? ? ?Procedures/Studies: ?CT Angio Chest Pulmonary Embolism (PE) W or WO Contrast ? ?Result Date: 04/06/2022 ?CLINICAL DATA:  Chest tightness and shortness of breath 2 days. Possible pulmonary embolism. EXAM: CT ANGIOGRAPHY CHEST WITH CONTRAST TECHNIQUE: Multidetector CT imaging of the chest was performed using the standard protocol during bolus administration of intravenous contrast. Multiplanar CT image reconstructions and MIPs were obtained to evaluate the vascular anatomy. RADIATION DOSE REDUCTION: This exam was performed according to the departmental dose-optimization program which includes automated exposure control, adjustment of the mA and/or kV according to patient size and/or use of iterative reconstruction technique. CONTRAST:  43mL OMNIPAQUE IOHEXOL 350 MG/ML SOLN COMPARISON:  04/19/2021 FINDINGS: Cardiovascular: Mild stable cardiomegaly. Ascending thoracic aorta measures 3.8 cm unchanged. Pulmonary arterial system is well opacified without evidence of emboli. Remaining vascular structures are unremarkable. Mediastinum/Nodes: 1.4 cm subcarinal lymph node likely reactive. Possible 1.4 cm right hilar lymph  node. No left hilar adenopathy. Remainder of the mediastinum is unremarkable. Lungs/Pleura: Mild biapical pleural thickening with minimal biapical nodularity unchanged. Small right effusion and tiny amount left pleural fluid. Subtle hazy density over the right infrahilar region which may be due to asymmetric vascular congestion versus early infection or atelectasis. Remainder of the right lung as well as the left lung is clear. Airways are within normal. Upper Abdomen: Minimal cystic change over the visualized upper kidneys. No acute findings. Musculoskeletal: No focal abnormality. Review of the MIP images confirms the above findings. IMPRESSION: 1. No evidence of pulmonary embolism. 2. Subtle hazy density over the right infrahilar region which may be due to asymmetric vascular congestion versus early infection or atelectasis. Small right effusion and tiny amount of left pleural fluid. Mild reactive mediastinal and right hilar adenopathy. 3. Mild stable cardiomegaly. 4. Bilateral renal cysts. 5. Stable ectasia of the ascending thoracic aorta measuring 3.8 cm. Recommend annual imaging followup by CTA or MRA. This recommendation follows 2010 ACCF/AHA/AATS/ACR/ASA/SCA/SCAI/SIR/STS/SVM Guidelines for the Diagnosis and Management of Patients with Thoracic Aortic Disease. Circulation.2010; 121: T732-K025. Aortic aneurysm NOS (ICD10-I71.9). Electronically Signed   By: Elberta Fortis M.D.   On: 04/06/2022 16:07  ? ?DG Chest Port 1 View ? ?Result Date: 04/06/2022 ?CLINICAL DATA:  Shortness of breath with chest tightness 2 days. EXAM: PORTABLE CHEST 1 VIEW COMPARISON:  04/19/2021 FINDINGS: Patient is slightly rotated to the left. Lungs are adequately inflated without focal airspace consolidation or effusion. Minimal prominence of the pulmonary vasculature which may be due to mild vascular congestion. Borderline cardiomegaly. Remainder of the exam is unchanged. IMPRESSION: Borderline cardiomegaly with suggestion of minimal  vascular congestion. Electronically Signed   By: Elberta Fortis M.D.   On: 04/06/2022 07:57  ? ?ECHOCARDIOGRAM COMPLETE ? ?Result Date: 04/06/2022 ?   ECHOCARDIOGRAM REPORT   Patient Name:   Donald Molina Date of Exa

## 2022-04-10 NOTE — Progress Notes (Signed)
? ? ?Progress Note ? ?Patient Name: Donald Molina ?Date of Encounter: 04/10/2022 ? ?Primary Cardiologist: Fletcher Anon ? ?Subjective  ? ?TEE guided DCCV was unsuccessful after 2 shocks on 04/09/2022.  Now on amiodarone.  He remains in A-fib with well-controlled ventricular rates.  No chest pain, dyspnea, palpitations, dizziness, presyncope, or syncope.  No significant lower extremity swelling or orthopnea.  Documented urine output 1.2 L for the past 24 hours with a net -7.3 L for the admission. ? ?Inpatient Medications  ?  ?Scheduled Meds: ? amiodarone  400 mg Oral BID  ? apixaban  5 mg Oral BID  ? dapagliflozin propanediol  10 mg Oral QAC breakfast  ? furosemide  40 mg Oral BID  ? metoprolol succinate  100 mg Oral Daily  ? potassium chloride  40 mEq Oral Daily  ? sodium chloride flush  3 mL Intravenous Q12H  ? ?Continuous Infusions: ? sodium chloride    ? sodium chloride 400 mL/hr at 04/09/22 1347  ? sodium chloride    ? ?PRN Meds: ?sodium chloride, acetaminophen **OR** acetaminophen, ondansetron **OR** ondansetron (ZOFRAN) IV, sodium chloride flush  ? ?Vital Signs  ?  ?Vitals:  ? 04/09/22 2321 04/10/22 0454 04/10/22 0500 04/10/22 0837  ?BP: 104/80 102/80  110/78  ?Pulse:  82  91  ?Resp: 18 18  20   ?Temp: 98.3 ?F (36.8 ?C) 98 ?F (36.7 ?C)  97.7 ?F (36.5 ?C)  ?TempSrc: Oral Oral    ?SpO2: 98% 99%    ?Weight:   72.5 kg   ?Height:      ? ? ?Intake/Output Summary (Last 24 hours) at 04/10/2022 0936 ?Last data filed at 04/10/2022 0835 ?Gross per 24 hour  ?Intake --  ?Output 1420 ml  ?Net -1420 ml  ? ? ?Filed Weights  ? 04/08/22 0443 04/09/22 0500 04/10/22 0500  ?Weight: 77.5 kg 75.4 kg 72.5 kg  ? ? ?Telemetry  ?  ?Afib, 80s to 90s bpm - Personally Reviewed ? ?ECG  ?  ?No new tracings - Personally Reviewed ? ?Physical Exam  ? ?GEN: No acute distress.   ?Neck: No JVD. ?Cardiac: IRIR, II/VI systolic murmurs LSB, no rubs, or gallops.  ?Respiratory: Diminished breath sounds along the bases bilaterally.  ?GI: Soft, nontender,  non-distended.   ?MS: No edema; No deformity. ?Neuro:  Alert and oriented x 3; Nonfocal.  ?Psych: Normal affect. ? ?Labs  ?  ?Chemistry ?Recent Labs  ?Lab 04/06/22 ?[id number] 04/07/22 ?[id number] 04/08/22 ?[id number] 04/09/22 ?0450  ?NA 139 137 141  --   ?K 4.3 4.4 4.2 3.5  ?CL 106 105 102  --   ?CO2 24 24 27   --   ?GLUCOSE 109* 110* 140*  --   ?BUN 15 19 22   --   ?CREATININE 0.83 0.97 1.22 0.94  ?CALCIUM 9.2 9.0 9.1  --   ?PROT 8.2*  --   --   --   ?ALBUMIN 4.1  --   --   --   ?AST 39  --   --   --   ?ALT 19  --   --   --   ?ALKPHOS 57  --   --   --   ?BILITOT 1.5*  --   --   --   ?GFRNONAA >60 >60 >60 >60  ?ANIONGAP 9 8 12   --   ? ?  ? ?Hematology ?Recent Labs  ?Lab 04/08/22 ?[id number] 04/09/22 ?0450 04/10/22 ?0459  ?WBC 7.0 7.1 7.8  ?RBC 4.09* 4.26 4.61  ?HGB 13.0 13.6 14.8  ?  HCT 36.3* 37.0* 40.3  ?MCV 88.8 86.9 87.4  ?MCH 31.8 31.9 32.1  ?MCHC 35.8 36.8* 36.7*  ?RDW 12.6 12.1 12.3  ?PLT 180 179 190  ? ? ? ?Cardiac EnzymesNo results for input(s): TROPONINI in the last 168 hours. No results for input(s): TROPIPOC in the last 168 hours.  ? ?BNP ?Recent Labs  ?Lab 04/06/22 ?[id number]  ?BNP 1,383.6*  ? ?  ? ?DDimer No results for input(s): DDIMER in the last 168 hours.  ? ?Radiology  ?  ? ?Cardiac Studies  ? ?2D echo 04/06/2022: ?1. Left ventricular ejection fraction, by estimation, is 25%. The left  ?ventricle has severely decreased function. The left ventricle demonstrates  ?global hypokinesis. There is mild left ventricular hypertrophy. Left  ?ventricular diastolic parameters are  ?indeterminate.  ? 2. Right ventricular systolic function is moderately reduced. The right  ?ventricular size is normal. Mildly increased right ventricular wall  ?thickness. There is severely elevated pulmonary artery systolic pressure.  ?The estimated right ventricular  ?systolic pressure is [id number] mmHg.  ? 3. Left atrial size was moderately dilated.  ? 4. The mitral valve is normal in structure. Moderate to severe mitral  ?valve regurgitation. No evidence of mitral  stenosis.  ? 5. Tricuspid valve regurgitation is moderate.  ? 6. The aortic valve is tricuspid. Aortic valve regurgitation is not  ?visualized. No aortic stenosis is present.  ? 7. The inferior vena cava is dilated in size with <50% respiratory  ?variability, suggesting right atrial pressure of 15 mmHg. ?___________ ? ?TEE 04/09/2022: ?1. Left ventricular ejection fraction, by estimation, is 20 to 25%. The  ?left ventricle has severely decreased function. The left ventricle  ?demonstrates global hypokinesis.  ? 2. Right ventricular systolic function is moderately reduced. The right  ?ventricular size is normal.  ? 3. Left atrial size was mild to moderately dilated. No left atrial/left  ?atrial appendage thrombus was detected.  ? 4. Right atrial size was moderately dilated.  ? 5. The mitral valve is normal in structure. Mild to moderate mitral valve  ?regurgitation.  ? 6. The aortic valve is tricuspid. Aortic valve regurgitation is not  ?visualized. ? ?Patient Profile  ?   ?71 y.o. male with history of HFrEF secondary to NICM, persistent Afib (Eliquis/amio), HTN, and noncompliance, who presented 5/12 w/ a 2-3 day history of worsening DOE and orthopnea and was found to be in Afib w/ RVR (noncompliant w/ home meds and f/u).  EF 25% by echo w/ mod-sev MR, mod TR, and RVSP 87mmHg. ? ?Assessment & Plan  ?  ?1. Afib with RVR: ?-He remains in Afib with controlled ventricular response ?-TEE guided DCCV was unsuccessful following 2 shocks on 04/09/2022 ?-Continue with newly added amiodarone for typical 10 g load (400 mg twice daily for 1 week followed by 20 mg twice daily for 1 week followed by 20 mg daily thereafter) ?-Pursue repeat DCCV in several weeks after he has been adequately loaded with amiodarone if he remains in A-fib ?-Toprol-XL given cardiomyopathy ?-Importance of medical adherence discussed  ?-Apixaban ? ?2. Acute on chronic HFrEF secondary to NICM: ?-Previously noted to develop a cardiomyopathy in 2019 when in  Afib with LHC showing normal cors at that time ?-Improved ?-Transition IV Lasix to oral furosemide 40 mg twice daily ?-Given cardiomyopathy and persistent A-fib, continue Toprol-XL ?-[place] ?-Escalate GDMT with ARB and MRA as tolerated (intolerant to Entresto due to hypotension), this will need to be addressed in the outpatient setting given he is lost to follow-up  at times ?-Pursue repeat echo in several months in sinus rhythm and on maximally tolerated GDMT to reassess LVSF ?-CHF education  ? ?3. Demand ischemia: ?-High sensitivity troponin of 80 with a delta troponin of 56, felt to be in the setting of the above ?-Normal coronary arteries by cath in 04/2018 ?-No angina ?-No plans for inpatient ischemic evaluation at this time ?-Follow up as outpatient for further evaluation ? ?4. Valvular heart disease: ?-Moderate tricuspid regurgitation in setting of LV dysfunction, volume overload and RVSP of 64 mmHg ?-Moderate to severe mitral regurgitation ?-Diuresis and eventual rhythm management as above ?-Plan to reassess EF and valvular heart disease in 2-3 months ? ?5. HTN: ?-Blood pressure well controlled ?-Continue medications as above ? ?6. Noncompliance: ?-Medical adherence is recommended  ? ? ?For questions or updates, please contact Leonard ?Please consult [email or web address] for contact info under Cardiology/STEMI. ?  ? ?Signed, ?Christell Faith, PA-C ?[facility] ?Pager: [phone] ?04/10/2022, 9:36 AM ? ?

## 2022-04-12 NOTE — Progress Notes (Deleted)
Patient ID: Donald Molina, male    DOB: [birth year], 71 y.o.   MRN: [id number]  HPI  Donald Molina is a 71 y/o male with a history of atrial fibrillation, HTN, remote tobacco use and chronic heart failure.   Echo TEE report from 04/09/22 reviewed and showed an EF of 20-25% along with mild/moderate Donald but no atrial thrombus. Unsuccessful cardioversion. Echo report from 07/03/2019 reviewed and showed an EF of 30-35% along with mild/moderate Donald. TEE and cardioversion report from 05/07/18 reviewed and showed an EF of 20-25% along with trivial AR, mild/mod Donald & TR and small pleural effusion. Echo report from 05/03/18 reviewed which showed an EF of 25-30% along with moderate Donald/TR and moderately elevated PA pressure of 52 mm Hg.   RHC/ LHC done 05/05/18 showed normal coronary arteries, severely elevated filling pressures, moderate pulmonary HTN and severely reduced cardiac output. CO was 2.98 with cardiac index of 1.47  Admitted 04/06/22 due to SOB and chest pain. Found to be in AF RVR. Cardiology consult obtained. Initial given IV lasix with transition to oral diuretics. Cardioversion was unsuccessful so amiodarone was started. Elevated tropoinin thought to be due to demand ischemia. Discharged after 4 days.   He presents today for a follow-up visit although hasn't been seen since August 2020. He presents with a chief complaint of   Past Medical History:  Diagnosis Date   HFrEF (heart failure with reduced ejection fraction) (HCC)    a. 04/2018 Echo: EF 25-30%; b. 06/2018 Echo: EF 45-50%. diff HK, Gr1 DD. Nl RV fxn; c. 06/2019 Echo: EF 30-35%, diff HK, mod reduced RV fxn, mild BAE, mild-mod Donald, mild PS.   Hypertension    NICM (nonischemic cardiomyopathy) (HCC)    a. 04/2018 Echo: EF 25-30%; b. 06/2018 Echo: EF 45-50%; c. 06/2019 Echo: EF 30-35%.   Persistent atrial fibrillation (HCC)    a. Dx 04/2018 s/p DCCV and amio loading. CHA2DS2VASc 3-->eliquis.   Past Surgical History:  Procedure Laterality Date   CARDIAC  CATHETERIZATION     CARDIOVERSION N/A 04/09/2022   Procedure: CARDIOVERSION;  Surgeon: Debbe Odea, MD;  Location: [facility];  Service: Cardiovascular;  Laterality: N/A;   RIGHT/LEFT HEART CATH AND CORONARY ANGIOGRAPHY N/A 05/05/2018   Procedure: RIGHT/LEFT HEART CATH AND CORONARY ANGIOGRAPHY;  Surgeon: Iran Ouch, MD;  Location: [facility];  Service: Cardiovascular;  Laterality: N/A;   TEE WITHOUT CARDIOVERSION N/A 05/07/2018   Procedure: TRANSESOPHAGEAL ECHOCARDIOGRAM (TEE);  Surgeon: Yvonne Kendall, MD;  Location: [facility];  Service: Cardiovascular;  Laterality: N/A;   TEE WITHOUT CARDIOVERSION N/A 04/09/2022   Procedure: TRANSESOPHAGEAL ECHOCARDIOGRAM (TEE);  Surgeon: Debbe Odea, MD;  Location: [facility];  Service: Cardiovascular;  Laterality: N/A;   Family History  Problem Relation Age of Onset   Cancer Mother        pancreatic   Cervical cancer Mother    Dementia Father    Heart failure Father    Heart disease Sister    Social History   Tobacco Use   Smoking status: Former    Years: 10.00    Types: Cigarettes    Quit date: 05/28/1998    Years since quitting: 23.8   Smokeless tobacco: Former    Types: Chew    Quit date: 05/28/1998  Substance Use Topics   Alcohol use: Yes    Comment: 2-3, 40 oz beers/wk   Allergies  Allergen Reactions   Entresto [Sacubitril-Valsartan] Other (See Comments)    hypotension  Review of Systems  Constitutional:  Positive for fatigue (very little). Negative for appetite change.  HENT:  Negative for congestion, postnasal drip and sore throat.   Eyes: Negative.   Respiratory:  Negative for cough, chest tightness and shortness of breath.   Cardiovascular:  Negative for chest pain, palpitations and leg swelling.  Gastrointestinal:  Negative for abdominal distention and abdominal pain.  Endocrine: Negative.   Genitourinary: Negative.   Musculoskeletal:  Negative for back pain and neck pain.  Skin: Negative.    Allergic/Immunologic: Negative.   Neurological:  Negative for dizziness and light-headedness.  Hematological:  Negative for adenopathy. Does not bruise/bleed easily.  Psychiatric/Behavioral:  Negative for dysphoric mood and sleep disturbance (sleeping on 1 pillow). The patient is not nervous/anxious.      Physical Exam Vitals and nursing note reviewed.  Constitutional:      Appearance: He is well-developed.  HENT:     Head: Normocephalic and atraumatic.  Neck:     Vascular: No JVD.  Cardiovascular:     Rate and Rhythm: Normal rate and regular rhythm.  Pulmonary:     Effort: Pulmonary effort is normal.     Breath sounds: Normal breath sounds. No wheezing or rales.  Abdominal:     General: There is no distension.     Palpations: Abdomen is soft.  Musculoskeletal:        General: No tenderness.     Cervical back: Normal range of motion and neck supple.  Skin:    General: Skin is warm and dry.  Neurological:     Mental Status: He is alert and oriented to person, place, and time.  Psychiatric:        Behavior: Behavior normal.        Thought Content: Thought content normal.   Assessment & Plan:  1: Chronic heart failure with reduced ejection fraction- - NYHA class II - euvolemic today - weighing daily and he was reminded to call for an overnight weight gain of >2 pounds or a weekly weight gain of >5 pounds - not adding salt and trying to read food labels. Reminded to closely follow a 2000mg  diet  - has been walking ~ 20 minutes every morning - on GDMT of  - BNP 04/06/22 was 1383.6  2: HTN- - BP  - seeing PCP @ [facility]  - BMP 04/08/22 reviewed and showed sodium 141, potassium 4.2, creatinine 1.22 and GFR >60.   3: Atrial fibrillation- - unsuccessful cardioversion during recent admission - saw cardiology ([facility]) 08/16/20; returns 04/16/22   Medication bottles were reviewed.

## 2022-04-13 ENCOUNTER — Ambulatory Visit: Payer: Medicaid Other | Admitting: Family

## 2022-04-13 ENCOUNTER — Telehealth: Payer: Self-pay | Admitting: Family

## 2022-04-13 NOTE — Telephone Encounter (Signed)
Patient did not show for his [facility] Failure [facility] appointment on 04/13/22. Will attempt to reschedule.

## 2022-04-15 NOTE — Progress Notes (Signed)
Cardiology Office Note    Date:  04/16/2022   ID:  [name], DOB 1951/10/31, MRN [id number]  PCP:  Pcp, No  Cardiologist:  Lorine Bears, MD  Electrophysiologist:  None   Chief Complaint: Hospital follow up  History of Present Illness:   Donald Molina is a 71 y.o. male with history of normal coronary arteries by LHC in 04/2018, HFrEF secondary to NICM, persistent A-fib, HTN, and medication noncompliance leading to recurrent hospital admissions who presents for hospital follow-up as outlined below.  He was admitted to the hospital in 04/2018 with A-fib and volume overload.  Echo demonstrated an EF of 25 to 30%.  Cardiac cath showed normal coronary arteries.  Following diuresis and amiodarone loading, he underwent successful DCCV.  He had recurrent A-fib following discharge in the setting of medication noncompliance with subsequent successful cardioversion thereafter following initiation of medications.  Follow-up echo in 06/2018 showed an improved LVEF of 45 to 50%.  In 06/2019, he was readmitted with dyspnea recurrent A-fib in the setting of medication noncompliance.  EF was again found to be reduced at 30 to 35% with diffuse hypokinesis.  He was diuresed and placed back on beta-blocker and apixaban with plan for rhythm management in the outpatient setting.  He was last seen in the office in 07/2020 and was noted to be in sinus rhythm.  He had been lost to follow-up since then until he was admitted to the hospital in 03/2022 as outlined below.  He was admitted to the hospital on 04/06/2022 through 04/10/2022 with volume overload and recurrent A-fib in the context of medication noncompliance.  He reported having not filled his medications for at least a year.  High-sensitivity troponin peaked at 80 with subsequent downtrend.  BNP 1383.  Echo demonstrated an EF of 25%, global hypokinesis with LVH, indeterminate LV diastolic function parameters, moderately reduced RV systolic function with normal  ventricular cavity size and mildly increased ventricular wall thickness, elevated PASP estimated at 64 mmHg, moderately dilated left atrium moderate to severe mitral valve regurgitation, moderate tricuspid regurgitation, and an estimated right atrial pressure 15 mmHg.  He underwent diuresis and escalation of GDMT.  He subsequently underwent unusuccessful TEE guided DCCV with 2 shocks at 200 J.  Following this, it was recommended he be loaded with amiodarone with plans for repeat attempt of DCCV in the outpatient setting.  He comes in today and is doing well from a cardiac perspective.  No symptoms of angina or decompensation.  No significant lower extremity swelling, abdominal distention, orthopnea, dyspnea, palpitations, dizziness, presyncope, or syncope.  He has not missed any doses of his medications, including apixaban.  No falls, hematochezia, melena, hemoptysis, hematemesis, or hematuria.  His weight has remained stable by his home scale at 168 pounds.  He is not adding salt to food and drinking less than 2 L of liquids daily.   Labs independently reviewed: 03/2022 - Hgb 14.8, PLT 190, potassium 3.5, BUN 22, serum creatinine 1.22, TSH normal, albumin 4.1, AST/ALT normal 07/2020 - TC 201, TG 110, HDL 63, LDL 119  Past Medical History:  Diagnosis Date   HFrEF (heart failure with reduced ejection fraction) (HCC)    a. 04/2018 Echo: EF 25-30%; b. 06/2018 Echo: EF 45-50%. diff HK, Gr1 DD. Nl RV fxn; c. 06/2019 Echo: EF 30-35%, diff HK, mod reduced RV fxn, mild BAE, mild-mod MR, mild PS.   Hypertension    NICM (nonischemic cardiomyopathy) (HCC)    a. 04/2018 Echo: EF 25-30%; b. 06/2018  Echo: EF 45-50%; c. 06/2019 Echo: EF 30-35%.   Persistent atrial fibrillation (HCC)    a. Dx 04/2018 s/p DCCV and amio loading. CHA2DS2VASc 3-->eliquis.    Past Surgical History:  Procedure Laterality Date   CARDIAC CATHETERIZATION     CARDIOVERSION N/A 04/09/2022   Procedure: CARDIOVERSION;  Surgeon: Debbe Odea,  MD;  Location: [facility];  Service: Cardiovascular;  Laterality: N/A;   RIGHT/LEFT HEART CATH AND CORONARY ANGIOGRAPHY N/A 05/05/2018   Procedure: RIGHT/LEFT HEART CATH AND CORONARY ANGIOGRAPHY;  Surgeon: Iran Ouch, MD;  Location: [facility];  Service: Cardiovascular;  Laterality: N/A;   TEE WITHOUT CARDIOVERSION N/A 05/07/2018   Procedure: TRANSESOPHAGEAL ECHOCARDIOGRAM (TEE);  Surgeon: Yvonne Kendall, MD;  Location: [facility];  Service: Cardiovascular;  Laterality: N/A;   TEE WITHOUT CARDIOVERSION N/A 04/09/2022   Procedure: TRANSESOPHAGEAL ECHOCARDIOGRAM (TEE);  Surgeon: Debbe Odea, MD;  Location: [facility];  Service: Cardiovascular;  Laterality: N/A;    Current Medications: Current Meds  Medication Sig   [START ON 04/23/2022] amiodarone (PACERONE) 200 MG tablet Take 1 tablet (200 mg total) by mouth 2 (two) times daily for 7 days, THEN 1 tablet (200 mg total) daily for 23 days.   apixaban (ELIQUIS) 5 MG TABS tablet Take 1 tablet (5 mg total) by mouth 2 (two) times daily.   dapagliflozin propanediol (FARXIGA) 10 MG TABS tablet Take 1 tablet (10 mg total) by mouth daily before breakfast.   furosemide (LASIX) 40 MG tablet Take 1 tablet (40 mg total) by mouth 2 (two) times daily.   metoprolol succinate (TOPROL-XL) 100 MG 24 hr tablet Take 1 tablet (100 mg total) by mouth daily. Take with or immediately following a meal.   potassium chloride SA (KLOR-CON M) 20 MEQ tablet Take 2 tablets (40 mEq total) by mouth daily.   [DISCONTINUED] amiodarone (PACERONE) 400 MG tablet Take 1 tablet (400 mg total) by mouth 2 (two) times daily for 14 days.    Allergies:   Entresto [sacubitril-valsartan]   Social History   Socioeconomic History   Marital status: Divorced    Spouse name: Not on file   Number of children: 2   Years of education: 12   Highest education level: 12th grade  Occupational History   Occupation: retired  Tobacco Use   Smoking status: Former    Years: 10.00     Types: Cigarettes    Quit date: 05/28/1998    Years since quitting: 23.9   Smokeless tobacco: Former    Types: Chew    Quit date: 05/28/1998  Vaping Use   Vaping Use: Never used  Substance and Sexual Activity   Alcohol use: Yes    Comment: 2-3, 40 oz beers/wk   Drug use: Never   Sexual activity: Yes    Birth control/protection: Condom  Other Topics Concern   Not on file  Social History Narrative   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   Social Determinants of Health   Financial Resource Strain: Not on file  Food Insecurity: Not on file  Transportation Needs: Not on file  Physical Activity: Not on file  Stress: Not on file  Social Connections: Not on file     Family History:  The patient's family history includes Cancer in his mother; Cervical cancer in his mother; Dementia in his father; Heart disease in his sister; Heart failure in his father.  ROS:   12-point review of systems is negative unless otherwise noted in the HPI  EKGs/Labs/Other Studies Reviewed:    Studies reviewed were summarized above. The additional studies were reviewed today:  TEE 04/09/2022: 1. Left ventricular ejection fraction, by estimation, is 20 to 25%. The  left ventricle has severely decreased function. The left ventricle  demonstrates global hypokinesis.   2. Right ventricular systolic function is moderately reduced. The right  ventricular size is normal.   3. Left atrial size was mild to moderately dilated. No left atrial/left  atrial appendage thrombus was detected.   4. Right atrial size was moderately dilated.   5. The mitral valve is normal in structure. Mild to moderate mitral valve  regurgitation.   6. The aortic valve is tricuspid. Aortic valve regurgitation is not  visualized. __________  2D echo 04/06/2022: 1. Left ventricular ejection fraction, by estimation, is 25%. The left  ventricle has severely decreased function. The left ventricle demonstrates   global hypokinesis. There is mild left ventricular hypertrophy. Left  ventricular diastolic parameters are  indeterminate.   2. Right ventricular systolic function is moderately reduced. The right  ventricular size is normal. Mildly increased right ventricular wall  thickness. There is severely elevated pulmonary artery systolic pressure.  The estimated right ventricular  systolic pressure is 64.0 mmHg.   3. Left atrial size was moderately dilated.   4. The mitral valve is normal in structure. Moderate to severe mitral  valve regurgitation. No evidence of mitral stenosis.   5. Tricuspid valve regurgitation is moderate.   6. The aortic valve is tricuspid. Aortic valve regurgitation is not  visualized. No aortic stenosis is present.   7. The inferior vena cava is dilated in size with <50% respiratory  variability, suggesting right atrial pressure of 15 mmHg. __________  2D echo 07/03/2019: 1. The left ventricle has moderate-severely reduced systolic function,  with an ejection fraction of 30-35%. The cavity size was mild to  moderately dilated. Findings are consistent with dilated cardiomyopathy.  Left ventricular diastolic parameters were  normal. Left ventricular diffuse hypokinesis.   2. The right ventricle has moderately reduced systolic function. The  cavity was moderately enlarged. There is no increase in right ventricular  wall thickness.   3. Left atrial size was mildly dilated.   4. Right atrial size was mildly dilated.   5. The mitral valve is grossly normal. Mild thickening of the mitral  valve leaflet. Mitral valve regurgitation is mild to moderate by color  flow Doppler.   6. The aortic valve is grossly normal.   7. Mild valvular pulmonic stenosis.   8. The aorta is normal in size and structure.   9. The interatrial septum was not assessed. __________  2D echo 07/08/2018: - Left ventricle: The cavity size was at the upper limits of    normal. Wall thickness was  increased in a pattern of mild LVH.    Systolic function was mildly reduced. The estimated ejection    fraction was in the range of 45% to 50%. Diffuse hypokinesis.    Doppler parameters are consistent with abnormal left ventricular    relaxation (grade 1 diastolic dysfunction).  - Left atrium: The atrium was mildly dilated.  - Right ventricle: The cavity size was mildly dilated. Systolic    function was normal.   Impressions:   - Compared with prior echo from 05/04/18, LVEF has significantly    improved. Mitral regurgitation has decreased. __________  TEE 05/07/2018: - Left ventricle: Systolic function was severely reduced. The    estimated ejection fraction was in the range of  20% to 25%.  - Aortic valve: There was trivial regurgitation.  - Aortic root: The aortic root was mildly dilated.  - Mitral valve: There was mild to moderate regurgitation.  - Left atrium: No evidence of thrombus in the atrial cavity or    appendage.  - Right ventricle: Systolic function was reduced.  - Right atrium: No evidence of thrombus in the atrial cavity or    appendage.  - Tricuspid valve: There was mild-moderate regurgitation.  - Pericardium, extracardiac: There was a small left pleural    effusion.   Impressions:   - Successful cardioversion.  ___________  [facility] 05/05/2018: 1.  Normal coronary arteries. 2.  Severely reduced LV systolic function by echo.  Left ventricular angiography was not performed. 3.  Right heart catheterization showed severely elevated filling pressures, moderate pulmonary hypertension and severely reduced cardiac output.  Cardiac output was 2.98 with a cardiac index of 1.47.   Recommendations: The patient has nonischemic cardiomyopathy likely tachycardia induced.  He continues to be significantly volume overloaded and I recommend continuing IV diuresis. Resume heparin drip 8 hours after sheath pull.  A DOAC can be started tomorrow.  I recommend TEE guided cardioversion  before hospital discharge given degree of cardiomyopathy.  This can likely be done on Wednesday once volume overload improves.  I am going to start the patient on oral amiodarone to facilitate cardioversion. __________  2D echo 05/03/2018: - Left ventricle: The cavity size was normal. Systolic function was    severely reduced. The estimated ejection fraction was in the    range of 25% to 30%. Diffuse hypokinesis. Regional wall motion    abnormalities cannot be excluded. The study is not technically    sufficient to allow evaluation of LV diastolic function.  - Aortic root: The aortic root was mildly dilated  - Mitral valve: There was moderate regurgitation.  - Left atrium: The atrium was moderately dilated.  - Right ventricle: The cavity size was mildly to moderately    dilated. Wall thickness was normal. Systolic function was mildly    reduced.  - Right atrium: The atrium was mildly dilated.  - Tricuspid valve: There was moderate regurgitation.  - Pulmonary arteries: Systolic pressure was moderately elevated PA    peak pressure: 52 mm Hg (S).   Impressions:   - Rhythm is atrial fibrillation.   EKG:  EKG is ordered today.  The EKG ordered today demonstrates atrial flutter with variable AV block, 70 bpm, left axis deviation, LVH, nonspecific anterolateral ST-T changes  Recent Labs: 04/06/2022: ALT 19; B Natriuretic Peptide 1,383.6; TSH 0.619 04/08/2022: BUN 22; Sodium 141 04/09/2022: Creatinine, Ser 0.94; Potassium 3.5 04/10/2022: Hemoglobin 14.8; Platelets 190  Recent Lipid Panel    Component Value Date/Time   CHOL 201 (H) 08/04/2020 0950   TRIG 110 08/04/2020 0950   HDL 63 08/04/2020 0950   CHOLHDL 3.2 08/04/2020 0950   CHOLHDL 3.3 05/04/2018 0138   VLDL 13 05/04/2018 0138   LDLCALC 119 (H) 08/04/2020 0950    PHYSICAL EXAM:    VS:  BP 110/74 (BP Location: Left Arm, Patient Position: Sitting, Cuff Size: Normal)   Pulse 70   Ht 5\' 11"  (1.803 m)   Wt 164 lb 8 oz (74.6 kg)    BMI 22.94 kg/m   BMI: Body mass index is 22.94 kg/m.  Physical Exam Vitals reviewed.  Constitutional:      Appearance: He is well-developed.  HENT:     Head: Normocephalic and atraumatic.  Eyes:  General:        Right eye: No discharge.        Left eye: No discharge.  Neck:     Vascular: No JVD.  Cardiovascular:     Rate and Rhythm: Normal rate. Rhythm regularly irregular.     Pulses:          Posterior tibial pulses are 2+ on the right side and 2+ on the left side.     Heart sounds: S1 normal and S2 normal. Heart sounds not distant. No midsystolic click and no opening snap. Murmur heard.  High-pitched blowing holosystolic murmur is present with a grade of 2/6 at the apex.    No friction rub.  Pulmonary:     Effort: Pulmonary effort is normal. No respiratory distress.     Breath sounds: Normal breath sounds. No decreased breath sounds, wheezing or rales.  Chest:     Chest wall: No tenderness.  Abdominal:     General: There is no distension.     Palpations: Abdomen is soft.     Tenderness: There is no abdominal tenderness.  Musculoskeletal:     Cervical back: Normal range of motion.     Right lower leg: No edema.     Left lower leg: No edema.  Skin:    General: Skin is warm and dry.     Nails: There is no clubbing.  Neurological:     Mental Status: He is alert and oriented to person, place, and time.  Psychiatric:        Speech: Speech normal.        Behavior: Behavior normal.        Thought Content: Thought content normal.        Judgment: Judgment normal.    Wt Readings from Last 3 Encounters:  04/16/22 164 lb 8 oz (74.6 kg)  04/10/22 159 lb 13.3 oz (72.5 kg)  04/19/21 181 lb (82.1 kg)     ASSESSMENT & PLAN:   HFrEF secondary to NICM: He appears euvolemic and well compensated with NYHA class II symptoms.  Escalation of GDMT has been limited secondary to relative hypotension and concerns for patient follow-up.  I did discuss with him in great detail today  the importance of office visit and laboratory follow-up when looking to escalate evidence-based medical therapy, up to and including potential death.  We will check a BMP today.  If renal function and potassium allow, we will look to add low-dose spironolactone.  Relative hypotension precludes addition of ARB or ARNI at this time.  If we are able to start MRA, we will repeat a BMP 1 week after initiating therapy.  He will otherwise continue Farxiga, furosemide, and Toprol-XL.  We will look to repeat an approximately 2 to 3 months after restoring sinus rhythm.  If his cardiomyopathy persists at that time would recommend referral to the advanced heart failure clinic in [place] as well as to the structural heart clinic for evaluation of MitraClip.  CHF education.  Recurrent persistent A-fib/flutter: He remains in rate controlled A-fib/flutter.  Decrease amiodarone to 200 mg twice daily for 1 week followed by 200 mg daily thereafter.  Continue Toprol-XL as outlined above.  Given a CHA2DS2-VASc of at least 3, he remains on apixaban 5 mg twice daily (does not meet reduced dosing criteria), and is without symptoms concerning for bleeding.  He has not missed any doses of anticoagulation.  I will see him back in 3 weeks time, after amiodarone loading, if he  remains in A-fib we will pursue repeat DCCV on metoprolol and amiodarone.  Ideally, would look to avoid long-term use of amiodarone.  Normal coronary arteries with elevated high-sensitivity troponin: No symptoms concerning for angina.  Recent LHC showed normal coronary arteries.  Elevated high-sensitivity troponin likely in the setting of volume overload and A-fib/flutter.  He is on apixaban in place of aspirin to minimize bleeding risk.  Valvular heart disease: Look to repeat an echo in 2 to 3 months following restoration of sinus rhythm to reevaluate.  May need to consider referral to the structural heart team in [place] as outlined above.  HTN: Blood  pressure is well controlled in the office today.  Continue medical therapy as outlined above.  Medication noncompliance: The importance follow-up and medications was discussed in detail with the patient today.    Disposition: F/u with Dr. Kirke Corin or an APP in 3 weeks.   Medication Adjustments/Labs and Tests Ordered: Current medicines are reviewed at length with the patient today.  Concerns regarding medicines are outlined above. Medication changes, Labs and Tests ordered today are summarized above and listed in the Patient Instructions accessible in Encounters.   Signed, Eula Listen, PA-C 04/16/2022 3:44 PM     [facility] [place] [address] [place], [place] [address] [phone]

## 2022-04-16 ENCOUNTER — Telehealth: Payer: Self-pay | Admitting: Physician Assistant

## 2022-04-16 ENCOUNTER — Encounter: Payer: Self-pay | Admitting: Physician Assistant

## 2022-04-16 ENCOUNTER — Ambulatory Visit (INDEPENDENT_AMBULATORY_CARE_PROVIDER_SITE_OTHER): Payer: Medicare Other | Admitting: Physician Assistant

## 2022-04-16 ENCOUNTER — Other Ambulatory Visit
Admission: RE | Admit: 2022-04-16 | Discharge: 2022-04-16 | Disposition: A | Discharge status: Home / Self Care | Payer: Medicare Other | Attending: Physician Assistant | Admitting: Physician Assistant

## 2022-04-16 VITALS — BP 110/74 | HR 70 | Ht 71.0 in | Wt 164.5 lb

## 2022-04-16 DIAGNOSIS — I10 Essential (primary) hypertension: Secondary | ICD-10-CM

## 2022-04-16 DIAGNOSIS — I4819 Other persistent atrial fibrillation: Secondary | ICD-10-CM | POA: Diagnosis not present

## 2022-04-16 DIAGNOSIS — I38 Endocarditis, valve unspecified: Secondary | ICD-10-CM | POA: Insufficient documentation

## 2022-04-16 DIAGNOSIS — I5022 Chronic systolic (congestive) heart failure: Secondary | ICD-10-CM | POA: Insufficient documentation

## 2022-04-16 DIAGNOSIS — I428 Other cardiomyopathies: Secondary | ICD-10-CM | POA: Insufficient documentation

## 2022-04-16 DIAGNOSIS — Z91148 Patient's other noncompliance with medication regimen for other reason: Secondary | ICD-10-CM | POA: Insufficient documentation

## 2022-04-16 DIAGNOSIS — I110 Hypertensive heart disease with heart failure: Secondary | ICD-10-CM | POA: Diagnosis not present

## 2022-04-16 LAB — BASIC METABOLIC PANEL
Anion gap: 10 (ref 5–15)
BUN: 19 mg/dL (ref 8–23)
CO2: 25 mmol/L (ref 22–32)
Calcium: 9 mg/dL (ref 8.9–10.3)
Chloride: 101 mmol/L (ref 98–111)
Creatinine, Ser: 1.41 mg/dL — ABNORMAL HIGH (ref 0.61–1.24)
GFR, Estimated: 53 mL/min — ABNORMAL LOW (ref >60)
Glucose, Bld: 81 mg/dL (ref 70–99)
Potassium: 3.9 mmol/L (ref 3.5–5.1)
Sodium: 136 mmol/L (ref 135–145)

## 2022-04-16 MED ORDER — AMIODARONE HCL 200 MG PO TABS: ORAL_TABLET | ORAL | 0 refills | Status: DC

## 2022-04-16 NOTE — Telephone Encounter (Signed)
Attempted to call Mobile number and it states number is no longer in service.  Called home number and voicemail message requests to call back later with no option to leave message.

## 2022-04-16 NOTE — Patient Instructions (Addendum)
Medication Instructions:  Your physician has recommended you make the following change in your medication:   DECREASE Amiodarone to 200 mg twice dailyl for 1 week. THEN DECREASE STARTING 04/23/22 to 200 mg once daily   *If you need a refill on your cardiac medications before your next appointment, please call your pharmacy*   Lab Work: BMET today. Go over to [facility] entrance and check in at registration.   If you have labs (blood work) drawn today and your tests are completely normal, you will receive your results only by: [place] (if you have MyChart) OR A paper copy in the mail If you have any lab test that is abnormal or we need to change your treatment, we will call you to review the results.   Testing/Procedures: None   Follow-Up: At [facility], you and your health needs are our priority.  As part of our continuing mission to provide you with exceptional heart care, we have created designated Provider Care Teams.  These Care Teams include your primary Cardiologist (physician) and Advanced Practice Providers (APPs -  Physician Assistants and Nurse Practitioners) who all work together to provide you with the care you need, when you need it.  We recommend signing up for the patient portal called "MyChart".  Sign up information is provided on this After Visit Summary.  MyChart is used to connect with patients for Virtual Visits (Telemedicine).  Patients are able to view lab/test results, encounter notes, upcoming appointments, etc.  Non-urgent messages can be sent to your provider as well.   To learn more about what you can do with MyChart, go to [email or web address].    Your next appointment:   3 week(s)  The format for your next appointment:   In Person  Provider:   Kathlyn Sacramento, MD or Christell Faith, PA-C      Important Information About Sugar

## 2022-04-17 NOTE — Telephone Encounter (Signed)
Patient came in for appointment.  

## 2022-04-18 ENCOUNTER — Telehealth: Payer: Self-pay | Admitting: Physician Assistant

## 2022-04-18 DIAGNOSIS — I5022 Chronic systolic (congestive) heart failure: Secondary | ICD-10-CM

## 2022-04-18 DIAGNOSIS — I428 Other cardiomyopathies: Secondary | ICD-10-CM

## 2022-04-18 DIAGNOSIS — I4819 Other persistent atrial fibrillation: Secondary | ICD-10-CM

## 2022-04-18 NOTE — Telephone Encounter (Signed)
Attempted to contact the patient regarding results & PA recommendations. Home #- no answer/ no voice mail Mobile #- message states the # is not in service.   Will attempt to reach the patient at a later time.

## 2022-04-18 NOTE — Telephone Encounter (Signed)
Rise Mu, PA-C  04/18/2022  7:34 AM EDT     Labs show normal potassium with mildly elevated serum creatinine.  Decrease furosemide to 40 mg once daily and KCl to 20 mEq daily.  We will defer addition of spironolactone at this time given mild AKI.  Recommend follow-up BMP in 1 week to trend renal function, and at that time may consider addition of spironolactone.

## 2022-04-19 DIAGNOSIS — I10 Essential (primary) hypertension: Secondary | ICD-10-CM | POA: Diagnosis not present

## 2022-04-19 DIAGNOSIS — I509 Heart failure, unspecified: Secondary | ICD-10-CM | POA: Diagnosis not present

## 2022-04-19 DIAGNOSIS — E782 Mixed hyperlipidemia: Secondary | ICD-10-CM | POA: Diagnosis not present

## 2022-04-19 DIAGNOSIS — I4891 Unspecified atrial fibrillation: Secondary | ICD-10-CM | POA: Diagnosis not present

## 2022-04-19 DIAGNOSIS — N400 Enlarged prostate without lower urinary tract symptoms: Secondary | ICD-10-CM | POA: Diagnosis not present

## 2022-04-24 ENCOUNTER — Encounter: Payer: Self-pay | Admitting: *Deleted

## 2022-04-24 NOTE — Telephone Encounter (Signed)
No answer/No voicemail available. Mailed out letter to patient with information and request to call back for review.

## 2022-04-26 NOTE — Telephone Encounter (Signed)
No answer/No voicemail. Letter mailed out to patient with results and instructions.

## 2022-05-08 NOTE — H&P (View-Only) (Signed)
Cardiology Office Note    Date:  05/10/2022   ID:  Donald Molina, DOB [birth year], MRN [id number]  PCP:  Pcp, No  Cardiologist:  Lorine Bears, MD  Electrophysiologist:  None   Chief Complaint: Follow-up  History of Present Illness:   Donald Molina is a 71 y.o. male with history of normal coronary arteries by LHC in 04/2018, HFrEF secondary to NICM, persistent A-fib, HTN, and medication noncompliance leading to recurrent hospital admissions who presents for follow-up of his cardiomyopathy and A-fib.   He was admitted to the hospital in 04/2018 with A-fib and volume overload.  Echo demonstrated an EF of 25 to 30%.  Cardiac cath showed normal coronary arteries.  Following diuresis and amiodarone loading, he underwent successful DCCV.  He had recurrent A-fib following discharge in the setting of medication noncompliance with subsequent successful cardioversion thereafter following initiation of medications.  Follow-up echo in 06/2018 showed an improved LVEF of 45 to 50%.  In 06/2019, he was readmitted with dyspnea recurrent A-fib in the setting of medication noncompliance.  EF was again found to be reduced at 30 to 35% with diffuse hypokinesis.  He was diuresed and placed back on beta-blocker and apixaban with plan for rhythm management in the outpatient setting.  He was seen in the office in 07/2020 and was noted to be in sinus rhythm.  He had been lost to follow-up since then until he was admitted to the hospital in May of this year.   He was admitted to the hospital in 03/2022 with volume overload and recurrent A-fib in the context of medication noncompliance.  He reported having not filled his medications for at least a year.  High-sensitivity troponin peaked at 80 with subsequent downtrend.  BNP 1383.  Echo demonstrated an EF of 25%, global hypokinesis with LVH, indeterminate LV diastolic function parameters, moderately reduced RV systolic function with normal ventricular cavity size and mildly  increased ventricular wall thickness, elevated PASP estimated at 64 mmHg, moderately dilated left atrium moderate to severe mitral valve regurgitation, moderate tricuspid regurgitation, and an estimated right atrial pressure 15 mmHg.  He underwent diuresis and escalation of GDMT.  He subsequently underwent unusuccessful TEE guided DCCV with 2 shocks at 200 J.  Following this, it was recommended he be loaded with amiodarone with plans for repeat attempt of DCCV in the outpatient setting.  He was seen in hospital follow-up on 04/16/2022 and was without symptoms of angina or decompensation.  His weight remains stable by his home scale at 168 pounds.  He reported adherence to medications.  We were planning to escalate GDMT based on updated renal function/electrolytes.  Labs obtained at that time showed mild AKI with recommendation to decrease furosemide and KCl and defer escalation of GDMT until labs could be trended.  However, we were unable to get in contact with the patient, therefore a letter was sent to him.  He comes in accompanied by his daughter today and is doing well from a cardiac perspective, without symptoms of angina or decompensation.  No significant dizziness, presyncope, or syncope.  No lower extremity swelling, abdominal distention, orthopnea.  He does not add salt to food and monitor sodium content.  He is drinking a little over 2 L of liquid per day.  He has been adherent to all medications, including apixaban, and denies missing any doses.  No falls, hematochezia, or melena.  His weight remains stable at home.  He is interested in moving forward with DCCV.   Labs independently  reviewed: 03/2022 - potassium 3.9, BUN 19, serum creatinine 1.41, Hgb 14.8, PLT 190, TSH normal, albumin 4.1, AST/ALT normal 07/2020 - TC 201, TG 110, HDL 63, LDL 119  Past Medical History:  Diagnosis Date   HFrEF (heart failure with reduced ejection fraction) (HCC)    a. 04/2018 Echo: EF 25-30%; b. 06/2018 Echo: EF  45-50%. diff HK, Gr1 DD. Nl RV fxn; c. 06/2019 Echo: EF 30-35%, diff HK, mod reduced RV fxn, mild BAE, mild-mod MR, mild PS.   Hypertension    NICM (nonischemic cardiomyopathy) (HCC)    a. 04/2018 Echo: EF 25-30%; b. 06/2018 Echo: EF 45-50%; c. 06/2019 Echo: EF 30-35%.   Persistent atrial fibrillation (HCC)    a. Dx 04/2018 s/p DCCV and amio loading. CHA2DS2VASc 3-->eliquis.    Past Surgical History:  Procedure Laterality Date   CARDIAC CATHETERIZATION     CARDIOVERSION N/A 04/09/2022   Procedure: CARDIOVERSION;  Surgeon: Debbe Odea, MD;  Location: [facility] ORS;  Service: Cardiovascular;  Laterality: N/A;   RIGHT/LEFT HEART CATH AND CORONARY ANGIOGRAPHY N/A 05/05/2018   Procedure: RIGHT/LEFT HEART CATH AND CORONARY ANGIOGRAPHY;  Surgeon: Iran Ouch, MD;  Location: [facility] INVASIVE CV LAB;  Service: Cardiovascular;  Laterality: N/A;   TEE WITHOUT CARDIOVERSION N/A 05/07/2018   Procedure: TRANSESOPHAGEAL ECHOCARDIOGRAM (TEE);  Surgeon: Yvonne Kendall, MD;  Location: [facility] ORS;  Service: Cardiovascular;  Laterality: N/A;   TEE WITHOUT CARDIOVERSION N/A 04/09/2022   Procedure: TRANSESOPHAGEAL ECHOCARDIOGRAM (TEE);  Surgeon: Debbe Odea, MD;  Location: [facility] ORS;  Service: Cardiovascular;  Laterality: N/A;    Current Medications: Current Meds  Medication Sig   amiodarone (PACERONE) 200 MG tablet Take 1 tablet (200 mg total) by mouth daily.   apixaban (ELIQUIS) 5 MG TABS tablet Take 1 tablet (5 mg total) by mouth 2 (two) times daily.   dapagliflozin propanediol (FARXIGA) 10 MG TABS tablet Take 1 tablet (10 mg total) by mouth daily before breakfast.   furosemide (LASIX) 40 MG tablet Take 1 tablet (40 mg total) by mouth 2 (two) times daily.   metoprolol succinate (TOPROL-XL) 100 MG 24 hr tablet Take 1 tablet (100 mg total) by mouth daily. Take with or immediately following a meal.   potassium chloride SA (KLOR-CON M) 20 MEQ tablet Take 2 tablets (40 mEq total) by mouth daily.    [DISCONTINUED] amiodarone (PACERONE) 200 MG tablet Take 1 tablet (200 mg total) by mouth 2 (two) times daily for 7 days, THEN 1 tablet (200 mg total) daily for 23 days.    Allergies:   Entresto [sacubitril-valsartan]   Social History   Socioeconomic History   Marital status: Divorced    Spouse name: Not on file   Number of children: 2   Years of education: 12   Highest education level: 12th grade  Occupational History   Occupation: retired  Tobacco Use   Smoking status: Former    Years: 10.00    Types: Cigarettes    Quit date: 05/28/1998    Years since quitting: 23.9   Smokeless tobacco: Former    Types: Chew    Quit date: 05/28/1998  Vaping Use   Vaping Use: Never used  Substance and Sexual Activity   Alcohol use: Yes    Comment: 2-3, 40 oz beers/wk   Drug use: Never   Sexual activity: Yes    Birth control/protection: Condom  Other Topics Concern   Not on file  Social History Narrative   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  Social Determinants of Health   Financial Resource Strain: Low Risk  (05/28/2018)   Overall Financial Resource Strain (CARDIA)    Difficulty of Paying Living Expenses: Not hard at all  Food Insecurity: No Food Insecurity (05/28/2018)   Hunger Vital Sign    Worried About Running Out of Food in the Last Year: Never true    Ran Out of Food in the Last Year: Never true  Transportation Needs: No Transportation Needs (05/28/2018)   PRAPARE - Administrator, Civil Service (Medical): No    Lack of Transportation (Non-Medical): No  Physical Activity: Insufficiently Active (05/28/2018)   Exercise Vital Sign    Days of Exercise per Week: 7 days    Minutes of Exercise per Session: 20 min  Stress: No Stress Concern Present (05/28/2018)   [employer] of Occupational Health - Occupational Stress Questionnaire    Feeling of Stress : Not at all  Social Connections: Not on file     Family History:  The patient's family  history includes Cancer in his mother; Cervical cancer in his mother; Dementia in his father; Heart disease in his sister; Heart failure in his father.  ROS:   12-point review of systems is negative unless otherwise noted in HPI.   EKGs/Labs/Other Studies Reviewed:    Studies reviewed were summarized above. The additional studies were reviewed today:  TEE 04/09/2022: 1. Left ventricular ejection fraction, by estimation, is 20 to 25%. The  left ventricle has severely decreased function. The left ventricle  demonstrates global hypokinesis.   2. Right ventricular systolic function is moderately reduced. The right  ventricular size is normal.   3. Left atrial size was mild to moderately dilated. No left atrial/left  atrial appendage thrombus was detected.   4. Right atrial size was moderately dilated.   5. The mitral valve is normal in structure. Mild to moderate mitral valve  regurgitation.   6. The aortic valve is tricuspid. Aortic valve regurgitation is not  visualized. __________   2D echo 04/06/2022: 1. Left ventricular ejection fraction, by estimation, is 25%. The left  ventricle has severely decreased function. The left ventricle demonstrates  global hypokinesis. There is mild left ventricular hypertrophy. Left  ventricular diastolic parameters are  indeterminate.   2. Right ventricular systolic function is moderately reduced. The right  ventricular size is normal. Mildly increased right ventricular wall  thickness. There is severely elevated pulmonary artery systolic pressure.  The estimated right ventricular  systolic pressure is 64.0 mmHg.   3. Left atrial size was moderately dilated.   4. The mitral valve is normal in structure. Moderate to severe mitral  valve regurgitation. No evidence of mitral stenosis.   5. Tricuspid valve regurgitation is moderate.   6. The aortic valve is tricuspid. Aortic valve regurgitation is not  visualized. No aortic stenosis is present.    7. The inferior vena cava is dilated in size with <50% respiratory  variability, suggesting right atrial pressure of 15 mmHg. __________   2D echo 07/03/2019: 1. The left ventricle has moderate-severely reduced systolic function,  with an ejection fraction of 30-35%. The cavity size was mild to  moderately dilated. Findings are consistent with dilated cardiomyopathy.  Left ventricular diastolic parameters were  normal. Left ventricular diffuse hypokinesis.   2. The right ventricle has moderately reduced systolic function. The  cavity was moderately enlarged. There is no increase in right ventricular  wall thickness.   3. Left atrial size was mildly dilated.   4.  Right atrial size was mildly dilated.   5. The mitral valve is grossly normal. Mild thickening of the mitral  valve leaflet. Mitral valve regurgitation is mild to moderate by color  flow Doppler.   6. The aortic valve is grossly normal.   7. Mild valvular pulmonic stenosis.   8. The aorta is normal in size and structure.   9. The interatrial septum was not assessed. __________   2D echo 07/08/2018: - Left ventricle: The cavity size was at the upper limits of    normal. Wall thickness was increased in a pattern of mild LVH.    Systolic function was mildly reduced. The estimated ejection    fraction was in the range of 45% to 50%. Diffuse hypokinesis.    Doppler parameters are consistent with abnormal left ventricular    relaxation (grade 1 diastolic dysfunction).  - Left atrium: The atrium was mildly dilated.  - Right ventricle: The cavity size was mildly dilated. Systolic    function was normal.   Impressions:   - Compared with prior echo from 05/04/18, LVEF has significantly    improved. Mitral regurgitation has decreased. __________   TEE 05/07/2018: - Left ventricle: Systolic function was severely reduced. The    estimated ejection fraction was in the range of 20% to 25%.  - Aortic valve: There was trivial  regurgitation.  - Aortic root: The aortic root was mildly dilated.  - Mitral valve: There was mild to moderate regurgitation.  - Left atrium: No evidence of thrombus in the atrial cavity or    appendage.  - Right ventricle: Systolic function was reduced.  - Right atrium: No evidence of thrombus in the atrial cavity or    appendage.  - Tricuspid valve: There was mild-moderate regurgitation.  - Pericardium, extracardiac: There was a small left pleural    effusion.   Impressions:   - Successful cardioversion.  ___________   [facility] 05/05/2018: 1.  Normal coronary arteries. 2.  Severely reduced LV systolic function by echo.  Left ventricular angiography was not performed. 3.  Right heart catheterization showed severely elevated filling pressures, moderate pulmonary hypertension and severely reduced cardiac output.  Cardiac output was 2.98 with a cardiac index of 1.47.   Recommendations: The patient has nonischemic cardiomyopathy likely tachycardia induced.  He continues to be significantly volume overloaded and I recommend continuing IV diuresis. Resume heparin drip 8 hours after sheath pull.  A DOAC can be started tomorrow.  I recommend TEE guided cardioversion before hospital discharge given degree of cardiomyopathy.  This can likely be done on Wednesday once volume overload improves.  I am going to start the patient on oral amiodarone to facilitate cardioversion. __________   2D echo 05/03/2018: - Left ventricle: The cavity size was normal. Systolic function was    severely reduced. The estimated ejection fraction was in the    range of 25% to 30%. Diffuse hypokinesis. Regional wall motion    abnormalities cannot be excluded. The study is not technically    sufficient to allow evaluation of LV diastolic function.  - Aortic root: The aortic root was mildly dilated  - Mitral valve: There was moderate regurgitation.  - Left atrium: The atrium was moderately dilated.  - Right ventricle:  The cavity size was mildly to moderately    dilated. Wall thickness was normal. Systolic function was mildly    reduced.  - Right atrium: The atrium was mildly dilated.  - Tricuspid valve: There was moderate regurgitation.  - Pulmonary  arteries: Systolic pressure was moderately elevated PA    peak pressure: 52 mm Hg (S).   Impressions:   - Rhythm is atrial fibrillation.   EKG:  EKG is ordered today.  The EKG ordered today demonstrates atrial flutter with variable AV block, 74 bpm, nonspecific ST-T changes in leads V4 and V5 consistent with prior tracing  Recent Labs: 04/06/2022: B Natriuretic Peptide 1,383.6 05/10/2022: ALT 15; BUN 15; Creatinine, Ser 0.98; Hemoglobin 14.1; Platelets 180; Potassium 4.5; Sodium 142; TSH 1.004  Recent Lipid Panel    Component Value Date/Time   CHOL 201 (H) 08/04/2020 0950   TRIG 110 08/04/2020 0950   HDL 63 08/04/2020 0950   CHOLHDL 3.2 08/04/2020 0950   CHOLHDL 3.3 05/04/2018 0138   VLDL 13 05/04/2018 0138   LDLCALC 119 (H) 08/04/2020 0950    PHYSICAL EXAM:    VS:  BP 128/86   Pulse 74   Ht 5\' 11"  (1.803 m)   Wt 169 lb (76.7 kg)   SpO2 96%   BMI 23.57 kg/m   BMI: Body mass index is 23.57 kg/m.  Physical Exam Vitals reviewed.  Constitutional:      Appearance: He is well-developed.  HENT:     Head: Normocephalic and atraumatic.  Eyes:     General:        Right eye: No discharge.        Left eye: No discharge.  Neck:     Vascular: No JVD.  Cardiovascular:     Rate and Rhythm: Normal rate. Rhythm regularly irregular.     Pulses:          Posterior tibial pulses are 2+ on the right side and 2+ on the left side.     Heart sounds: S1 normal and S2 normal. Heart sounds not distant. No midsystolic click and no opening snap. Murmur heard.     High-pitched blowing holosystolic murmur is present with a grade of 2/6 at the apex.     No friction rub.  Pulmonary:     Effort: Pulmonary effort is normal. No respiratory distress.     Breath  sounds: Normal breath sounds. No decreased breath sounds, wheezing or rales.  Chest:     Chest wall: No tenderness.  Abdominal:     General: There is no distension.     Palpations: Abdomen is soft.     Tenderness: There is no abdominal tenderness.  Musculoskeletal:     Cervical back: Normal range of motion.     Right lower leg: No edema.     Left lower leg: No edema.  Skin:    General: Skin is warm and dry.     Nails: There is no clubbing.  Neurological:     Mental Status: He is alert and oriented to person, place, and time.  Psychiatric:        Speech: Speech normal.        Behavior: Behavior normal.        Thought Content: Thought content normal.        Judgment: Judgment normal.     Wt Readings from Last 3 Encounters:  05/10/22 169 lb (76.7 kg)  04/16/22 164 lb 8 oz (74.6 kg)  04/10/22 159 lb 13.3 oz (72.5 kg)     ASSESSMENT & PLAN:   HFrEF secondary to NICM: He appears euvolemic, well compensated with NYHA class II symptoms.  Escalation of GDMT has been limited secondary to relative hypotension and concerns regarding patient follow-up.  For example,  we were unable to get in touch with him to provide recommendations regarding his labs obtained at his last visit.  In this setting, we will defer escalation of GDMT at this time until it is certain that we can safely add/adjust medications with routine follow-up and with appropriate communication.  Check CMP and CBC.  For now, he remains on we will plan to undergo outpatient DCCV within the next 1 to 2 weeks with plans to repeat a limited echo 2 to 3 months thereafter to reevaluate his cardiomyopathy.  Historically, when in sinus rhythm, and with medication adherence, he has normalization of LV systolic function.  However, if his cardiomyopathy persists despite medication adherence and restoration of sinus rhythm, we will recommend referral to the advanced heart failure clinic in [place], as well as to the structural heart clinic  for evaluation of MitraClip, along with ischemic evaluation.  CHF education.  Recurrent persistent A-fib/flutter: He remains in rate controlled A-fib/flutter.  Decrease amiodarone to 200 mg daily.  Continue Toprol-XL as outlined above.  Given a CHA2DS2-VASc of at least 3, he remains on apixaban 5 mg twice daily (he does not meet reduced dosing criteria).  He is without symptoms concerning for bleeding.  Check CMP, CBC, and TSH.  He has not missed any doses of anticoagulation since he was last seen.  We will pursue DCCV within the next 1 to 2 weeks on metoprolol and amiodarone.  Ideally, we would like to avoid long-term use of amiodarone and perhaps this can be discontinued several months from now.  Normal coronary arteries with elevated high-sensitivity troponin: No symptoms concerning for angina.  Recent LHC showed normal coronary arteries.  Prior elevated high-sensitivity troponin was felt to be likely in the setting of volume overload and A-fib/flutter.  He is on apixaban in place of aspirin to minimize bleeding risk.  Valvular heart disease: Look to repeat echo in 2 to 3 months following restoration of sinus rhythm to reevaluate.  May need to consider referral to the structural heart team in [place] as outlined above.  HTN: Blood pressure is well controlled in the office today.  Continue medical therapy as outlined above.  Medication nonadherence: He reports adherence to his medications since he was last seen.  The importance of follow-up and medications has been discussed.   Shared Decision Making/Informed Consent{  The risks (stroke, cardiac arrhythmias rarely resulting in the need for a temporary or permanent pacemaker, skin irritation or burns and complications associated with conscious sedation including aspiration, arrhythmia, respiratory failure and death), benefits (restoration of normal sinus rhythm) and alternatives of a direct current cardioversion were explained in detail to Mr.  Buller and he agrees to proceed.      Disposition: F/u with Dr. Kirke Corin or an APP in 2 to 3 weeks.   Medication Adjustments/Labs and Tests Ordered: Current medicines are reviewed at length with the patient today.  Concerns regarding medicines are outlined above. Medication changes, Labs and Tests ordered today are summarized above and listed in the Patient Instructions accessible in Encounters.   Signed, Eula Listen, PA-C 05/10/2022 2:18 PM     [facility] [place] [address] [place], [place] [address] [phone]

## 2022-05-08 NOTE — Progress Notes (Signed)
 Cardiology Office Note    Date:  05/10/2022   ID:  Donald Molina, DOB 08/25/1951, MRN [id number]  PCP:  Pcp, No  Cardiologist:  Muhammad Arida, MD  Electrophysiologist:  None   Chief Complaint: Follow-up  History of Present Illness:   Donald Molina is a 71 y.o. male with history of normal coronary arteries by LHC in 04/2018, HFrEF secondary to NICM, persistent A-fib, HTN, and medication noncompliance leading to recurrent hospital admissions who presents for follow-up of his cardiomyopathy and A-fib.   He was admitted to the hospital in 04/2018 with A-fib and volume overload.  Echo demonstrated an EF of 25 to 30%.  Cardiac cath showed normal coronary arteries.  Following diuresis and amiodarone loading, he underwent successful DCCV.  He had recurrent A-fib following discharge in the setting of medication noncompliance with subsequent successful cardioversion thereafter following initiation of medications.  Follow-up echo in 06/2018 showed an improved LVEF of 45 to 50%.  In 06/2019, he was readmitted with dyspnea recurrent A-fib in the setting of medication noncompliance.  EF was again found to be reduced at 30 to 35% with diffuse hypokinesis.  He was diuresed and placed back on beta-blocker and apixaban with plan for rhythm management in the outpatient setting.  He was seen in the office in 07/2020 and was noted to be in sinus rhythm.  He had been lost to follow-up since then until he was admitted to the hospital in May of this year.   He was admitted to the hospital in 03/2022 with volume overload and recurrent A-fib in the context of medication noncompliance.  He reported having not filled his medications for at least a year.  High-sensitivity troponin peaked at 80 with subsequent downtrend.  BNP 1383.  Echo demonstrated an EF of 25%, global hypokinesis with LVH, indeterminate LV diastolic function parameters, moderately reduced RV systolic function with normal ventricular cavity size and mildly  increased ventricular wall thickness, elevated PASP estimated at 64 mmHg, moderately dilated left atrium moderate to severe mitral valve regurgitation, moderate tricuspid regurgitation, and an estimated right atrial pressure 15 mmHg.  He underwent diuresis and escalation of GDMT.  He subsequently underwent unusuccessful TEE guided DCCV with 2 shocks at 200 J.  Following this, it was recommended he be loaded with amiodarone with plans for repeat attempt of DCCV in the outpatient setting.  He was seen in hospital follow-up on 04/16/2022 and was without symptoms of angina or decompensation.  His weight remains stable by his home scale at 168 pounds.  He reported adherence to medications.  We were planning to escalate GDMT based on updated renal function/electrolytes.  Labs obtained at that time showed mild AKI with recommendation to decrease furosemide and KCl and defer escalation of GDMT until labs could be trended.  However, we were unable to get in contact with the patient, therefore a letter was sent to him.  He comes in accompanied by his daughter today and is doing well from a cardiac perspective, without symptoms of angina or decompensation.  No significant dizziness, presyncope, or syncope.  No lower extremity swelling, abdominal distention, orthopnea.  He does not add salt to food and monitor sodium content.  He is drinking a little over 2 L of liquid per day.  He has been adherent to all medications, including apixaban, and denies missing any doses.  No falls, hematochezia, or melena.  His weight remains stable at home.  He is interested in moving forward with DCCV.   Labs independently   reviewed: 03/2022 - potassium 3.9, BUN 19, serum creatinine 1.41, Hgb 14.8, PLT 190, TSH normal, albumin 4.1, AST/ALT normal 07/2020 - TC 201, TG 110, HDL 63, LDL 119  Past Medical History:  Diagnosis Date   HFrEF (heart failure with reduced ejection fraction) (HCC)    a. 04/2018 Echo: EF 25-30%; b. 06/2018 Echo: EF  45-50%. diff HK, Gr1 DD. Nl RV fxn; c. 06/2019 Echo: EF 30-35%, diff HK, mod reduced RV fxn, mild BAE, mild-mod MR, mild PS.   Hypertension    NICM (nonischemic cardiomyopathy) (HCC)    a. 04/2018 Echo: EF 25-30%; b. 06/2018 Echo: EF 45-50%; c. 06/2019 Echo: EF 30-35%.   Persistent atrial fibrillation (HCC)    a. Dx 04/2018 s/p DCCV and amio loading. CHA2DS2VASc 3-->eliquis.    Past Surgical History:  Procedure Laterality Date   CARDIAC CATHETERIZATION     CARDIOVERSION N/A 04/09/2022   Procedure: CARDIOVERSION;  Surgeon: Debbe Odea, MD;  Location: [facility] ORS;  Service: Cardiovascular;  Laterality: N/A;   RIGHT/LEFT HEART CATH AND CORONARY ANGIOGRAPHY N/A 05/05/2018   Procedure: RIGHT/LEFT HEART CATH AND CORONARY ANGIOGRAPHY;  Surgeon: Iran Ouch, MD;  Location: [facility] INVASIVE CV LAB;  Service: Cardiovascular;  Laterality: N/A;   TEE WITHOUT CARDIOVERSION N/A 05/07/2018   Procedure: TRANSESOPHAGEAL ECHOCARDIOGRAM (TEE);  Surgeon: Yvonne Kendall, MD;  Location: [facility] ORS;  Service: Cardiovascular;  Laterality: N/A;   TEE WITHOUT CARDIOVERSION N/A 04/09/2022   Procedure: TRANSESOPHAGEAL ECHOCARDIOGRAM (TEE);  Surgeon: Debbe Odea, MD;  Location: [facility] ORS;  Service: Cardiovascular;  Laterality: N/A;    Current Medications: Current Meds  Medication Sig   amiodarone (PACERONE) 200 MG tablet Take 1 tablet (200 mg total) by mouth daily.   apixaban (ELIQUIS) 5 MG TABS tablet Take 1 tablet (5 mg total) by mouth 2 (two) times daily.   dapagliflozin propanediol (FARXIGA) 10 MG TABS tablet Take 1 tablet (10 mg total) by mouth daily before breakfast.   furosemide (LASIX) 40 MG tablet Take 1 tablet (40 mg total) by mouth 2 (two) times daily.   metoprolol succinate (TOPROL-XL) 100 MG 24 hr tablet Take 1 tablet (100 mg total) by mouth daily. Take with or immediately following a meal.   potassium chloride SA (KLOR-CON M) 20 MEQ tablet Take 2 tablets (40 mEq total) by mouth daily.    [DISCONTINUED] amiodarone (PACERONE) 200 MG tablet Take 1 tablet (200 mg total) by mouth 2 (two) times daily for 7 days, THEN 1 tablet (200 mg total) daily for 23 days.    Allergies:   Entresto [sacubitril-valsartan]   Social History   Socioeconomic History   Marital status: Divorced    Spouse name: Not on file   Number of children: 2   Years of education: 12   Highest education level: 12th grade  Occupational History   Occupation: retired  Tobacco Use   Smoking status: Former    Years: 10.00    Types: Cigarettes    Quit date: 05/28/1998    Years since quitting: 23.9   Smokeless tobacco: Former    Types: Chew    Quit date: 05/28/1998  Vaping Use   Vaping Use: Never used  Substance and Sexual Activity   Alcohol use: Yes    Comment: 2-3, 40 oz beers/wk   Drug use: Never   Sexual activity: Yes    Birth control/protection: Condom  Other Topics Concern   Not on file  Social History Narrative   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  Social Determinants of Health   Financial Resource Strain: Low Risk  (05/28/2018)   Overall Financial Resource Strain (CARDIA)    Difficulty of Paying Living Expenses: Not hard at all  Food Insecurity: No Food Insecurity (05/28/2018)   Hunger Vital Sign    Worried About Running Out of Food in the Last Year: Never true    Ran Out of Food in the Last Year: Never true  Transportation Needs: No Transportation Needs (05/28/2018)   PRAPARE - Transportation    Lack of Transportation (Medical): No    Lack of Transportation (Non-Medical): No  Physical Activity: Insufficiently Active (05/28/2018)   Exercise Vital Sign    Days of Exercise per Week: 7 days    Minutes of Exercise per Session: 20 min  Stress: No Stress Concern Present (05/28/2018)   [employer] - Occupational Stress Questionnaire    Feeling of Stress : Not at all  Social Connections: Not on file     Family History:  The patient's family  history includes Cancer in his mother; Cervical cancer in his mother; Dementia in his father; Heart disease in his sister; Heart failure in his father.  ROS:   12-point review of systems is negative unless otherwise noted in HPI.   EKGs/Labs/Other Studies Reviewed:    Studies reviewed were summarized above. The additional studies were reviewed today:  TEE 04/09/2022: 1. Left ventricular ejection fraction, by estimation, is 20 to 25%. The  left ventricle has severely decreased function. The left ventricle  demonstrates global hypokinesis.   2. Right ventricular systolic function is moderately reduced. The right  ventricular size is normal.   3. Left atrial size was mild to moderately dilated. No left atrial/left  atrial appendage thrombus was detected.   4. Right atrial size was moderately dilated.   5. The mitral valve is normal in structure. Mild to moderate mitral valve  regurgitation.   6. The aortic valve is tricuspid. Aortic valve regurgitation is not  visualized. __________   2D echo 04/06/2022: 1. Left ventricular ejection fraction, by estimation, is 25%. The left  ventricle has severely decreased function. The left ventricle demonstrates  global hypokinesis. There is mild left ventricular hypertrophy. Left  ventricular diastolic parameters are  indeterminate.   2. Right ventricular systolic function is moderately reduced. The right  ventricular size is normal. Mildly increased right ventricular wall  thickness. There is severely elevated pulmonary artery systolic pressure.  The estimated right ventricular  systolic pressure is 64.0 mmHg.   3. Left atrial size was moderately dilated.   4. The mitral valve is normal in structure. Moderate to severe mitral  valve regurgitation. No evidence of mitral stenosis.   5. Tricuspid valve regurgitation is moderate.   6. The aortic valve is tricuspid. Aortic valve regurgitation is not  visualized. No aortic stenosis is present.    7. The inferior vena cava is dilated in size with <50% respiratory  variability, suggesting right atrial pressure of 15 mmHg. __________   2D echo 07/03/2019: 1. The left ventricle has moderate-severely reduced systolic function,  with an ejection fraction of 30-35%. The cavity size was mild to  moderately dilated. Findings are consistent with dilated cardiomyopathy.  Left ventricular diastolic parameters were  normal. Left ventricular diffuse hypokinesis.   2. The right ventricle has moderately reduced systolic function. The  cavity was moderately enlarged. There is no increase in right ventricular  wall thickness.   3. Left atrial size was mildly dilated.   4.   Right atrial size was mildly dilated.   5. The mitral valve is grossly normal. Mild thickening of the mitral  valve leaflet. Mitral valve regurgitation is mild to moderate by color  flow Doppler.   6. The aortic valve is grossly normal.   7. Mild valvular pulmonic stenosis.   8. The aorta is normal in size and structure.   9. The interatrial septum was not assessed. __________   2D echo 07/08/2018: - Left ventricle: The cavity size was at the upper limits of    normal. Wall thickness was increased in a pattern of mild LVH.    Systolic function was mildly reduced. The estimated ejection    fraction was in the range of 45% to 50%. Diffuse hypokinesis.    Doppler parameters are consistent with abnormal left ventricular    relaxation (grade 1 diastolic dysfunction).  - Left atrium: The atrium was mildly dilated.  - Right ventricle: The cavity size was mildly dilated. Systolic    function was normal.   Impressions:   - Compared with prior echo from 05/04/18, LVEF has significantly    improved. Mitral regurgitation has decreased. __________   TEE 05/07/2018: - Left ventricle: Systolic function was severely reduced. The    estimated ejection fraction was in the range of 20% to 25%.  - Aortic valve: There was trivial  regurgitation.  - Aortic root: The aortic root was mildly dilated.  - Mitral valve: There was mild to moderate regurgitation.  - Left atrium: No evidence of thrombus in the atrial cavity or    appendage.  - Right ventricle: Systolic function was reduced.  - Right atrium: No evidence of thrombus in the atrial cavity or    appendage.  - Tricuspid valve: There was mild-moderate regurgitation.  - Pericardium, extracardiac: There was a small left pleural    effusion.   Impressions:   - Successful cardioversion.  ___________   [facility] 05/05/2018: 1.  Normal coronary arteries. 2.  Severely reduced LV systolic function by echo.  Left ventricular angiography was not performed. 3.  Right heart catheterization showed severely elevated filling pressures, moderate pulmonary hypertension and severely reduced cardiac output.  Cardiac output was 2.98 with a cardiac index of 1.47.   Recommendations: The patient has nonischemic cardiomyopathy likely tachycardia induced.  He continues to be significantly volume overloaded and I recommend continuing IV diuresis. Resume heparin drip 8 hours after sheath pull.  A DOAC can be started tomorrow.  I recommend TEE guided cardioversion before hospital discharge given degree of cardiomyopathy.  This can likely be done on Wednesday once volume overload improves.  I am going to start the patient on oral amiodarone to facilitate cardioversion. __________   2D echo 05/03/2018: - Left ventricle: The cavity size was normal. Systolic function was    severely reduced. The estimated ejection fraction was in the    range of 25% to 30%. Diffuse hypokinesis. Regional wall motion    abnormalities cannot be excluded. The study is not technically    sufficient to allow evaluation of LV diastolic function.  - Aortic root: The aortic root was mildly dilated  - Mitral valve: There was moderate regurgitation.  - Left atrium: The atrium was moderately dilated.  - Right ventricle:  The cavity size was mildly to moderately    dilated. Wall thickness was normal. Systolic function was mildly    reduced.  - Right atrium: The atrium was mildly dilated.  - Tricuspid valve: There was moderate regurgitation.  - Pulmonary  arteries: Systolic pressure was moderately elevated PA    peak pressure: 52 mm Hg (S).   Impressions:   - Rhythm is atrial fibrillation.   EKG:  EKG is ordered today.  The EKG ordered today demonstrates atrial flutter with variable AV block, 74 bpm, nonspecific ST-T changes in leads V4 and V5 consistent with prior tracing  Recent Labs: 04/06/2022: B Natriuretic Peptide 1,383.6 05/10/2022: ALT 15; BUN 15; Creatinine, Ser 0.98; Hemoglobin 14.1; Platelets 180; Potassium 4.5; Sodium 142; TSH 1.004  Recent Lipid Panel    Component Value Date/Time   CHOL 201 (H) 08/04/2020 0950   TRIG 110 08/04/2020 0950   HDL 63 08/04/2020 0950   CHOLHDL 3.2 08/04/2020 0950   CHOLHDL 3.3 05/04/2018 0138   VLDL 13 05/04/2018 0138   LDLCALC 119 (H) 08/04/2020 0950    PHYSICAL EXAM:    VS:  BP 128/86   Pulse 74   Ht 5\' 11"  (1.803 m)   Wt 169 lb (76.7 kg)   SpO2 96%   BMI 23.57 kg/m   BMI: Body mass index is 23.57 kg/m.  Physical Exam Vitals reviewed.  Constitutional:      Appearance: He is well-developed.  HENT:     Head: Normocephalic and atraumatic.  Eyes:     General:        Right eye: No discharge.        Left eye: No discharge.  Neck:     Vascular: No JVD.  Cardiovascular:     Rate and Rhythm: Normal rate. Rhythm regularly irregular.     Pulses:          Posterior tibial pulses are 2+ on the right side and 2+ on the left side.     Heart sounds: S1 normal and S2 normal. Heart sounds not distant. No midsystolic click and no opening snap. Murmur heard.     High-pitched blowing holosystolic murmur is present with a grade of 2/6 at the apex.     No friction rub.  Pulmonary:     Effort: Pulmonary effort is normal. No respiratory distress.     Breath  sounds: Normal breath sounds. No decreased breath sounds, wheezing or rales.  Chest:     Chest wall: No tenderness.  Abdominal:     General: There is no distension.     Palpations: Abdomen is soft.     Tenderness: There is no abdominal tenderness.  Musculoskeletal:     Cervical back: Normal range of motion.     Right lower leg: No edema.     Left lower leg: No edema.  Skin:    General: Skin is warm and dry.     Nails: There is no clubbing.  Neurological:     Mental Status: He is alert and oriented to person, place, and time.  Psychiatric:        Speech: Speech normal.        Behavior: Behavior normal.        Thought Content: Thought content normal.        Judgment: Judgment normal.     Wt Readings from Last 3 Encounters:  05/10/22 169 lb (76.7 kg)  04/16/22 164 lb 8 oz (74.6 kg)  04/10/22 159 lb 13.3 oz (72.5 kg)     ASSESSMENT & PLAN:   HFrEF secondary to NICM: He appears euvolemic, well compensated with NYHA class II symptoms.  Escalation of GDMT has been limited secondary to relative hypotension and concerns regarding patient follow-up.  For example,  we were unable to get in touch with him to provide recommendations regarding his labs obtained at his last visit.  In this setting, we will defer escalation of GDMT at this time until it is certain that we can safely add/adjust medications with routine follow-up and with appropriate communication.  Check CMP and CBC.  For now, he remains on we will plan to undergo outpatient DCCV within the next 1 to 2 weeks with plans to repeat a limited echo 2 to 3 months thereafter to reevaluate his cardiomyopathy.  Historically, when in sinus rhythm, and with medication adherence, he has normalization of LV systolic function.  However, if his cardiomyopathy persists despite medication adherence and restoration of sinus rhythm, we will recommend referral to the advanced heart failure clinic in [place], as well as to the structural heart clinic  for evaluation of MitraClip, along with ischemic evaluation.  CHF education.  Recurrent persistent A-fib/flutter: He remains in rate controlled A-fib/flutter.  Decrease amiodarone to 200 mg daily.  Continue Toprol-XL as outlined above.  Given a CHA2DS2-VASc of at least 3, he remains on apixaban 5 mg twice daily (he does not meet reduced dosing criteria).  He is without symptoms concerning for bleeding.  Check CMP, CBC, and TSH.  He has not missed any doses of anticoagulation since he was last seen.  We will pursue DCCV within the next 1 to 2 weeks on metoprolol and amiodarone.  Ideally, we would like to avoid long-term use of amiodarone and perhaps this can be discontinued several months from now.  Normal coronary arteries with elevated high-sensitivity troponin: No symptoms concerning for angina.  Recent LHC showed normal coronary arteries.  Prior elevated high-sensitivity troponin was felt to be likely in the setting of volume overload and A-fib/flutter.  He is on apixaban in place of aspirin to minimize bleeding risk.  Valvular heart disease: Look to repeat echo in 2 to 3 months following restoration of sinus rhythm to reevaluate.  May need to consider referral to the structural heart team in [place] as outlined above.  HTN: Blood pressure is well controlled in the office today.  Continue medical therapy as outlined above.  Medication nonadherence: He reports adherence to his medications since he was last seen.  The importance of follow-up and medications has been discussed.   Shared Decision Making/Informed Consent{  The risks (stroke, cardiac arrhythmias rarely resulting in the need for a temporary or permanent pacemaker, skin irritation or burns and complications associated with conscious sedation including aspiration, arrhythmia, respiratory failure and death), benefits (restoration of normal sinus rhythm) and alternatives of a direct current cardioversion were explained in detail to Mr.  Buller and he agrees to proceed.      Disposition: F/u with Dr. Kirke Corin or an APP in 2 to 3 weeks.   Medication Adjustments/Labs and Tests Ordered: Current medicines are reviewed at length with the patient today.  Concerns regarding medicines are outlined above. Medication changes, Labs and Tests ordered today are summarized above and listed in the Patient Instructions accessible in Encounters.   Signed, Eula Listen, PA-C 05/10/2022 2:18 PM     [facility] [place] [address] [place], [place] [address] [phone]

## 2022-05-10 ENCOUNTER — Ambulatory Visit (INDEPENDENT_AMBULATORY_CARE_PROVIDER_SITE_OTHER): Payer: Medicare Other | Admitting: Physician Assistant

## 2022-05-10 ENCOUNTER — Encounter: Payer: Self-pay | Admitting: Physician Assistant

## 2022-05-10 ENCOUNTER — Other Ambulatory Visit: Payer: Self-pay | Admitting: Physician Assistant

## 2022-05-10 ENCOUNTER — Other Ambulatory Visit
Admission: RE | Admit: 2022-05-10 | Discharge: 2022-05-10 | Disposition: A | Discharge status: Home / Self Care | Payer: Medicare Other | Source: Ambulatory Visit | Attending: Physician Assistant | Admitting: Physician Assistant

## 2022-05-10 VITALS — BP 128/86 | HR 74 | Ht 71.0 in | Wt 169.0 lb

## 2022-05-10 DIAGNOSIS — I10 Essential (primary) hypertension: Secondary | ICD-10-CM

## 2022-05-10 DIAGNOSIS — I38 Endocarditis, valve unspecified: Secondary | ICD-10-CM | POA: Diagnosis not present

## 2022-05-10 DIAGNOSIS — Z91148 Patient's other noncompliance with medication regimen for other reason: Secondary | ICD-10-CM

## 2022-05-10 DIAGNOSIS — I428 Other cardiomyopathies: Secondary | ICD-10-CM

## 2022-05-10 DIAGNOSIS — I4819 Other persistent atrial fibrillation: Secondary | ICD-10-CM

## 2022-05-10 DIAGNOSIS — I5022 Chronic systolic (congestive) heart failure: Secondary | ICD-10-CM | POA: Diagnosis not present

## 2022-05-10 LAB — COMPREHENSIVE METABOLIC PANEL
ALT: 15 U/L (ref 0–44)
AST: 27 U/L (ref 15–41)
Albumin: 4 g/dL (ref 3.5–5.0)
Alkaline Phosphatase: 54 U/L (ref 38–126)
Anion gap: 7 (ref 5–15)
BUN: 15 mg/dL (ref 8–23)
CO2: 24 mmol/L (ref 22–32)
Calcium: 9.1 mg/dL (ref 8.9–10.3)
Chloride: 111 mmol/L (ref 98–111)
Creatinine, Ser: 0.98 mg/dL (ref 0.61–1.24)
GFR, Estimated: >60 mL/min (ref >60)
Glucose, Bld: 91 mg/dL (ref 70–99)
Potassium: 4.5 mmol/L (ref 3.5–5.1)
Sodium: 142 mmol/L (ref 135–145)
Total Bilirubin: 0.8 mg/dL (ref 0.3–1.2)
Total Protein: 7.8 g/dL (ref 6.5–8.1)

## 2022-05-10 LAB — CBC
HCT: 39.7 % (ref 39.0–52.0)
Hemoglobin: 14.1 g/dL (ref 13.0–17.0)
MCH: 30.9 pg (ref 26.0–34.0)
MCHC: 35.5 g/dL (ref 30.0–36.0)
MCV: 86.9 fL (ref 80.0–100.0)
Platelets: 180 10*3/uL (ref 150–400)
RBC: 4.57 MIL/uL (ref 4.22–5.81)
RDW: 12.9 % (ref 11.5–15.5)
WBC: 7.4 10*3/uL (ref 4.0–10.5)
nRBC: 0 % (ref 0.0–0.2)

## 2022-05-10 LAB — TSH: TSH: 1.004 u[IU]/mL (ref 0.350–4.500)

## 2022-05-10 MED ORDER — AMIODARONE HCL 200 MG PO TABS: 200.0000 mg | ORAL_TABLET | Freq: Every day | ORAL | 3 refills | Status: DC

## 2022-05-10 NOTE — Progress Notes (Signed)
Orders for DCCV have been signed and held. 

## 2022-05-10 NOTE — Patient Instructions (Signed)
Medication Instructions:  Your physician has recommended you make the following change in your medication:   DECREASE Amiodarone to 200 mg once daily   *If you need a refill on your cardiac medications before your next appointment, please call your pharmacy*   Lab Work: CBC, CMET, and TSH to be done today over at the Medical Mall Entrance at [facility] 1st desk on the right to check in (REGISTRATION)  Lab hours: Monday- Friday (7:30 am- 5:30 pm)   If you have labs (blood work) drawn today and your tests are completely normal, you will receive your results only by: MyChart Message (if you have MyChart) OR A paper copy in the mail If you have any lab test that is abnormal or we need to change your treatment, we will call you to review the results.   Testing/Procedures: You are scheduled for a Cardioversion on Thursday May 17, 2022 with Dr. Mariah Milling Please arrive at the [facility] of [facility] at 6:30 a.m. on the day of your procedure.  DIET INSTRUCTIONS:  Nothing to eat or drink after midnight except your medications with a small sip of water.      Medications:  HOLD Furosemide. YOU MAY TAKE ALL of your remaining medications with a small amount of water.   Must have a responsible person to drive you home.  Bring a current list of your medications and current insurance cards.    If you have any questions after you get home, please call the office at [phone]    Follow-Up: At [facility], you and your health needs are our priority.  As part of our continuing mission to provide you with exceptional heart care, we have created designated Provider Care Teams.  These Care Teams include your primary Cardiologist (physician) and Advanced Practice Providers (APPs -  Physician Assistants and Nurse Practitioners) who all work together to provide you with the care you need, when you need it.  We recommend signing up for the patient portal called "MyChart".  Sign up information is provided on  this After Visit Summary.  MyChart is used to connect with patients for Virtual Visits (Telemedicine).  Patients are able to view lab/test results, encounter notes, upcoming appointments, etc.  Non-urgent messages can be sent to your provider as well.   To learn more about what you can do with MyChart, go to [email or web address].    Your next appointment:   3-4 week(s)  The format for your next appointment:   In Person  Provider:   Eula Listen, PA-C      Important Information About Sugar

## 2022-05-11 ENCOUNTER — Telehealth: Payer: Self-pay | Admitting: *Deleted

## 2022-05-11 ENCOUNTER — Encounter: Payer: Self-pay | Admitting: *Deleted

## 2022-05-11 NOTE — Telephone Encounter (Signed)
-----   Message from Sondra Barges, PA-C sent at 05/10/2022  1:01 PM EDT ----- Thyroid function normal, kidney and liver function normal, potassium at goal, blood count normal.  Continue with planned DCCV and follow-up thereafter.  Defer escalation of GDMT given prior concerns regarding medication and follow-up adherence.

## 2022-05-11 NOTE — Telephone Encounter (Signed)
Called home number and it rang several times then answering machine responded saying to return call later. Will try mobile number.

## 2022-05-11 NOTE — Telephone Encounter (Signed)
Left voicemail message to call back for review of results and recommendations.  

## 2022-05-11 NOTE — Telephone Encounter (Signed)
Called mobile number "Not in service" will send letter with results and instructions to give [place] a call.

## 2022-05-15 NOTE — Telephone Encounter (Signed)
First line busy Mobile number not in service. Letter was mailed to patient with results and instructions to give [place] a call.

## 2022-05-16 MED ORDER — SODIUM CHLORIDE 0.9 % IV SOLN: INTRAVENOUS | Status: DC

## 2022-05-17 ENCOUNTER — Ambulatory Visit: Payer: Medicare Other | Admitting: Anesthesiology

## 2022-05-17 ENCOUNTER — Encounter: Payer: Self-pay | Admitting: Cardiovascular Disease

## 2022-05-17 ENCOUNTER — Encounter
Admission: RE | Disposition: A | Discharge status: Home / Self Care | Payer: Self-pay | Source: Ambulatory Visit | Attending: Cardiovascular Disease

## 2022-05-17 ENCOUNTER — Ambulatory Visit
Admission: RE | Admit: 2022-05-17 | Discharge: 2022-05-17 | Disposition: A | Discharge status: Home / Self Care | Payer: Medicare Other | Source: Ambulatory Visit | Attending: Cardiovascular Disease | Admitting: Cardiovascular Disease

## 2022-05-17 DIAGNOSIS — Z7901 Long term (current) use of anticoagulants: Secondary | ICD-10-CM | POA: Diagnosis not present

## 2022-05-17 DIAGNOSIS — I428 Other cardiomyopathies: Secondary | ICD-10-CM | POA: Insufficient documentation

## 2022-05-17 DIAGNOSIS — Z79899 Other long term (current) drug therapy: Secondary | ICD-10-CM | POA: Insufficient documentation

## 2022-05-17 DIAGNOSIS — I10 Essential (primary) hypertension: Secondary | ICD-10-CM | POA: Diagnosis not present

## 2022-05-17 DIAGNOSIS — Z91148 Patient's other noncompliance with medication regimen for other reason: Secondary | ICD-10-CM | POA: Diagnosis not present

## 2022-05-17 DIAGNOSIS — I4819 Other persistent atrial fibrillation: Secondary | ICD-10-CM | POA: Diagnosis not present

## 2022-05-17 DIAGNOSIS — Z87891 Personal history of nicotine dependence: Secondary | ICD-10-CM | POA: Insufficient documentation

## 2022-05-17 DIAGNOSIS — I081 Rheumatic disorders of both mitral and tricuspid valves: Secondary | ICD-10-CM | POA: Diagnosis not present

## 2022-05-17 DIAGNOSIS — I4892 Unspecified atrial flutter: Secondary | ICD-10-CM | POA: Insufficient documentation

## 2022-05-17 DIAGNOSIS — I110 Hypertensive heart disease with heart failure: Secondary | ICD-10-CM | POA: Insufficient documentation

## 2022-05-17 DIAGNOSIS — I5020 Unspecified systolic (congestive) heart failure: Secondary | ICD-10-CM | POA: Insufficient documentation

## 2022-05-17 DIAGNOSIS — I4891 Unspecified atrial fibrillation: Secondary | ICD-10-CM | POA: Diagnosis not present

## 2022-05-17 HISTORY — PX: CARDIOVERSION: SHX1299

## 2022-05-17 SURGERY — CARDIOVERSION
Anesthesia: General

## 2022-05-17 MED ORDER — PROPOFOL 1000 MG/100ML IV EMUL
INTRAVENOUS | Status: AC
  Filled 2022-05-17: qty 100

## 2022-05-17 MED ORDER — PROPOFOL 10 MG/ML IV BOLUS
INTRAVENOUS | Status: DC | PRN
  Administered 2022-05-17: 40 mg via INTRAVENOUS
  Administered 2022-05-17: 30 mg via INTRAVENOUS
  Administered 2022-05-17: 50 mg via INTRAVENOUS

## 2022-05-17 NOTE — Transfer of Care (Signed)
Immediate Anesthesia Transfer of Care Note  Patient: Donald Molina  Procedure(s) Performed: CARDIOVERSION  Patient Location: [facility]  Anesthesia Type:General  Level of Consciousness: drowsy  Airway & Oxygen Therapy: Patient Spontanous Breathing and Patient connected to face mask oxygen  Post-op Assessment: Report given to RN and Post -op Vital signs reviewed and stable  Post vital signs: Reviewed and stable  Last Vitals:  Vitals Value Taken Time  BP 139/106 05/17/22 0736  Temp    Pulse 76 05/17/22 0739  Resp 24 05/17/22 0739  SpO2 93 % 05/17/22 0739    Last Pain:  Vitals:   05/17/22 0701  TempSrc: Oral  PainSc: 0-No pain         Complications: No notable events documented.

## 2022-05-17 NOTE — Anesthesia Postprocedure Evaluation (Signed)
Anesthesia Post Note  Patient: Donald Molina  Procedure(s) Performed: CARDIOVERSION  Patient location during evaluation: PACU Anesthesia Type: General Level of consciousness: combative Pain management: pain level controlled Vital Signs Assessment: post-procedure vital signs reviewed and stable Respiratory status: spontaneous breathing, nonlabored ventilation and respiratory function stable Cardiovascular status: blood pressure returned to baseline and stable Postop Assessment: no apparent nausea or vomiting Anesthetic complications: no   No notable events documented.   Last Vitals:  Vitals:   05/17/22 0739 05/17/22 0745  BP:  138/88  Pulse: 76 (!) 59  Resp: (!) 24 (!) 22  Temp:    SpO2: 93% 96%    Last Pain:  Vitals:   05/17/22 0745  TempSrc:   PainSc: Asleep                 Foye Deer

## 2022-05-17 NOTE — CV Procedure (Signed)
Cardioversion procedure note °For atrial fibrillation, persistent ° °Procedure Details: ° °Consent: Risks of procedure as well as the alternatives and risks of each were explained to the (patient/caregiver).  Consent for procedure obtained. ° °Time Out: Verified patient identification, verified procedure, site/side was marked, verified correct patient position, special equipment/implants available, medications/allergies/relevent history reviewed, required imaging and test results available.  Performed ° °Patient placed on cardiac monitor, pulse oximetry, supplemental oxygen as necessary.   °Sedation given: propofol IV, Dr. Johnson °Pacer pads placed anterior and posterior chest. ° ° °Cardioverted 1 time(s).   °Cardioverted at  150 J. Synchronized biphasic °Converted to NSR ° ° °Evaluation: °Findings: Post procedure EKG shows: NSR °Complications: None °Patient did tolerate procedure well. ° °Time Spent Directly with the Patient: ° °45 minutes  ° °Donald Molina, M.D., Ph.D.  °

## 2022-05-18 ENCOUNTER — Encounter: Payer: Self-pay | Admitting: Cardiovascular Disease

## 2022-05-23 NOTE — Interval H&P Note (Signed)
History and Physical Interval Note:  05/23/2022 10:25 AM  Donald Molina  has presented today for surgery, with the diagnosis of Cardioversion  Afib.  The various methods of treatment have been discussed with the patient and family. After consideration of risks, benefits and other options for treatment, the patient has consented to  Procedure(s): CARDIOVERSION (N/A) as a surgical intervention.  The patient's history has been reviewed, patient examined, no change in status, stable for surgery.  I have reviewed the patient's chart and labs.  Questions were answered to the patient's satisfaction.     Julien Nordmann

## 2022-05-23 NOTE — H&P (Addendum)
H&P Addendum, pre-cardioversion ° °Patient was seen and evaluated prior to -cardioversion procedure °Symptoms, prior testing details again confirmed with the patient °Patient examined, no significant change from prior exam °Lab work reviewed in detail personally by myself °Patient understands risk and benefit of the procedure,  °The risks (stroke, cardiac arrhythmias rarely resulting in the need for a temporary or permanent pacemaker, skin irritation or burns and complications associated with conscious sedation including aspiration, arrhythmia, respiratory failure and death), benefits (restoration of normal sinus rhythm) and alternatives of a direct current cardioversion were explained in detail °Patient willing to proceed. ° °Signed, °Tim Amyra Vantuyl, MD, Ph.D °[facility]  °

## 2022-05-25 ENCOUNTER — Telehealth: Payer: Self-pay | Admitting: Cardiovascular Disease

## 2022-05-25 DIAGNOSIS — I4891 Unspecified atrial fibrillation: Secondary | ICD-10-CM

## 2022-05-25 MED ORDER — APIXABAN 5 MG PO TABS: 5.0000 mg | ORAL_TABLET | Freq: Two times a day (BID) | ORAL | 1 refills | Status: DC

## 2022-05-25 NOTE — Telephone Encounter (Signed)
*  STAT* If patient is at the pharmacy, call can be transferred to refill team.   1. Which medications need to be refilled? (please list name of each medication and dose if known)   apixaban (ELIQUIS) 5 MG TABS tablet (Expired)    2. Which pharmacy/location (including street and city if local pharmacy) is medication to be sent to? [id number] - [place], [place] - [address]  3. Do they need a 30 day or 90 day supply?  90 day

## 2022-05-25 NOTE — Telephone Encounter (Signed)
Refill Request.  

## 2022-05-25 NOTE — Telephone Encounter (Signed)
Eliquis 5mg  refill request received. Patient is 71 years old, weight-76.2kg, Crea-0.98 on 05/10/2022, Diagnosis-Afib, and last seen by 05/12/2022 on 05/10/2022. Dose is appropriate based on dosing criteria. Will send in refill to requested pharmacy.

## 2022-06-12 NOTE — Progress Notes (Signed)
Cardiology Office Note    Date:  06/15/2022   ID:  Donald Molina, DOB 26-Dec-1950, MRN [id number]  PCP:  Pcp, No  Cardiologist:  Lorine Bears, MD  Electrophysiologist:  None   Chief Complaint: Follow-up  History of Present Illness:   Donald Molina is a 71 y.o. male with history of normal coronary arteries by LHC in 04/2018, HFrEF secondary to NICM, persistent A-fib status post DCCV in 04/2022, HTN, and medication noncompliance leading to recurrent hospital admissions who presents for follow-up of his cardiomyopathy and A-fib.   He was admitted to the hospital in 04/2018 with A-fib and volume overload.  Echo demonstrated an EF of 25 to 30%.  Cardiac cath showed normal coronary arteries.  Following diuresis and amiodarone loading, he underwent successful DCCV.  He had recurrent A-fib following discharge in the setting of medication noncompliance with subsequent successful cardioversion thereafter following initiation of medications.  Follow-up echo in 06/2018 showed an improved LVEF of 45 to 50%.  In 06/2019, he was readmitted with dyspnea recurrent A-fib in the setting of medication noncompliance.  EF was again found to be reduced at 30 to 35% with diffuse hypokinesis.  He was diuresed and placed back on beta-blocker and apixaban with plan for rhythm management in the outpatient setting.  He was seen in the office in 07/2020 and was noted to be in sinus rhythm.  He had been lost to follow-up since then until he was admitted to the hospital in May of this year.   He was admitted to the hospital in 03/2022 with volume overload and recurrent A-fib in the context of medication noncompliance.  He reported having not filled his medications for at least a year.  High-sensitivity troponin peaked at 80 with subsequent downtrend.  BNP 1383.  Echo demonstrated an EF of 25%, global hypokinesis with LVH, indeterminate LV diastolic function parameters, moderately reduced RV systolic function with normal  ventricular cavity size and mildly increased ventricular wall thickness, elevated PASP estimated at 64 mmHg, moderately dilated left atrium moderate to severe mitral valve regurgitation, moderate tricuspid regurgitation, and an estimated right atrial pressure 15 mmHg.  He underwent diuresis and escalation of GDMT.  He subsequently underwent unusuccessful TEE guided DCCV with 2 shocks at 200 J.  Following this, it was recommended he be loaded with amiodarone with plans for repeat attempt of DCCV in the outpatient setting.   He was seen in hospital follow-up on 04/16/2022 and was without symptoms of angina or decompensation.  His weight remained stable by his home scale at 168 pounds.  He reported adherence to medications.  We were planning to escalate GDMT based on updated renal function/electrolytes.  Labs obtained at that time showed mild AKI with recommendation to decrease furosemide and KCl and defer escalation of GDMT until labs could be trended.  However, we were unable to get in contact with the patient, therefore a letter was sent to him.  He was last seen in the office on 05/10/2022 and was without symptoms of angina or decompensation.  He was adherent to his medications.  Escalation of GDMT was deferred given concerns regarding follow-up of labs, as we had been unable to get in touch with him via phone previously.  He underwent successful DCCV on 05/17/2022.  He comes in doing well from a cardiac perspective and is without symptoms of angina or decompensation.  No dyspnea, palpitations, dizziness, presyncope, syncope, lower extremity swelling, abdominal distention, orthopnea, or PND.  He reports adherence to all medications  and denies missing any doses.  No falls, hematochezia, or melena.  He is watching his salt intake.  He does indicate he is drinking a little over 2 L of liquid per day.  He continues to note gradual improvement in his functional status and indicates he feels better today than he has  in quite some time.  He does not have any active issues or concerns at this time.   Labs independently reviewed: 04/2022 - Hgb 14.1, PLT 180, potassium 4.5, BUN 15, serum creatinine 0.98, albumin 4.0, AST/ALT normal, TSH normal 07/2020 - TC 201, TG 110, HDL 63, LDL 119  Past Medical History:  Diagnosis Date   HFrEF (heart failure with reduced ejection fraction) (HCC)    a. 04/2018 Echo: EF 25-30%; b. 06/2018 Echo: EF 45-50%. diff HK, Gr1 DD. Nl RV fxn; c. 06/2019 Echo: EF 30-35%, diff HK, mod reduced RV fxn, mild BAE, mild-mod MR, mild PS.   Hypertension    NICM (nonischemic cardiomyopathy) (HCC)    a. 04/2018 Echo: EF 25-30%; b. 06/2018 Echo: EF 45-50%; c. 06/2019 Echo: EF 30-35%.   Persistent atrial fibrillation (HCC)    a. Dx 04/2018 s/p DCCV and amio loading. CHA2DS2VASc 3-->eliquis.    Past Surgical History:  Procedure Laterality Date   CARDIAC CATHETERIZATION     CARDIOVERSION N/A 04/09/2022   Procedure: CARDIOVERSION;  Surgeon: Debbe Odea, MD;  Location: [facility] ORS;  Service: Cardiovascular;  Laterality: N/A;   CARDIOVERSION N/A 05/17/2022   Procedure: CARDIOVERSION;  Surgeon: Antonieta Iba, MD;  Location: [facility] ORS;  Service: Cardiovascular;  Laterality: N/A;   RIGHT/LEFT HEART CATH AND CORONARY ANGIOGRAPHY N/A 05/05/2018   Procedure: RIGHT/LEFT HEART CATH AND CORONARY ANGIOGRAPHY;  Surgeon: Iran Ouch, MD;  Location: [facility] INVASIVE CV LAB;  Service: Cardiovascular;  Laterality: N/A;   TEE WITHOUT CARDIOVERSION N/A 05/07/2018   Procedure: TRANSESOPHAGEAL ECHOCARDIOGRAM (TEE);  Surgeon: Yvonne Kendall, MD;  Location: [facility] ORS;  Service: Cardiovascular;  Laterality: N/A;   TEE WITHOUT CARDIOVERSION N/A 04/09/2022   Procedure: TRANSESOPHAGEAL ECHOCARDIOGRAM (TEE);  Surgeon: Debbe Odea, MD;  Location: [facility] ORS;  Service: Cardiovascular;  Laterality: N/A;    Current Medications: Current Meds  Medication Sig   losartan (COZAAR) 25 MG tablet Take 1 tablet (25 mg total)  by mouth daily.   [DISCONTINUED] amiodarone (PACERONE) 200 MG tablet Take 1 tablet (200 mg total) by mouth daily.   [DISCONTINUED] apixaban (ELIQUIS) 5 MG TABS tablet Take 1 tablet (5 mg total) by mouth 2 (two) times daily.   [DISCONTINUED] furosemide (LASIX) 40 MG tablet Take 1 tablet (40 mg total) by mouth 2 (two) times daily.   [DISCONTINUED] metoprolol succinate (TOPROL-XL) 100 MG 24 hr tablet Take 1 tablet (100 mg total) by mouth daily. Take with or immediately following a meal.   [DISCONTINUED] potassium chloride SA (KLOR-CON M) 20 MEQ tablet Take 2 tablets (40 mEq total) by mouth daily.    Allergies:   Entresto [sacubitril-valsartan]   Social History   Socioeconomic History   Marital status: Divorced    Spouse name: Not on file   Number of children: 2   Years of education: 12   Highest education level: 12th grade  Occupational History   Occupation: retired  Tobacco Use   Smoking status: Former    Years: 10.00    Types: Cigarettes    Quit date: 05/28/1998    Years since quitting: 24.0   Smokeless tobacco: Former    Types: Chew    Quit date: 05/28/1998  Vaping Use   Vaping Use: Never used  Substance and Sexual Activity   Alcohol use: Yes    Comment: 2-3, 40 oz beers/wk   Drug use: Never   Sexual activity: Yes    Birth control/protection: Condom  Other Topics Concern   Not on file  Social History Narrative   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   Social Determinants of Health   Financial Resource Strain: Low Risk  (05/28/2018)   Overall Financial Resource Strain (CARDIA)    Difficulty of Paying Living Expenses: Not hard at all  Food Insecurity: No Food Insecurity (05/28/2018)   Hunger Vital Sign    Worried About Running Out of Food in the Last Year: Never true    Ran Out of Food in the Last Year: Never true  Transportation Needs: No Transportation Needs (05/28/2018)   PRAPARE - Administrator, Civil Service (Medical): No    Lack of  Transportation (Non-Medical): No  Physical Activity: Insufficiently Active (05/28/2018)   Exercise Vital Sign    Days of Exercise per Week: 7 days    Minutes of Exercise per Session: 20 min  Stress: No Stress Concern Present (05/28/2018)   [employer] of Occupational Health - Occupational Stress Questionnaire    Feeling of Stress : Not at all  Social Connections: Not on file     Family History:  The patient's family history includes Cancer in his mother; Cervical cancer in his mother; Dementia in his father; Heart disease in his sister; Heart failure in his father.  ROS:   12-point review of systems is negative unless otherwise noted in the HPI.   EKGs/Labs/Other Studies Reviewed:    Studies reviewed were summarized above. The additional studies were reviewed today:  TEE 04/09/2022: 1. Left ventricular ejection fraction, by estimation, is 20 to 25%. The  left ventricle has severely decreased function. The left ventricle  demonstrates global hypokinesis.   2. Right ventricular systolic function is moderately reduced. The right  ventricular size is normal.   3. Left atrial size was mild to moderately dilated. No left atrial/left  atrial appendage thrombus was detected.   4. Right atrial size was moderately dilated.   5. The mitral valve is normal in structure. Mild to moderate mitral valve  regurgitation.   6. The aortic valve is tricuspid. Aortic valve regurgitation is not  visualized. __________   2D echo 04/06/2022: 1. Left ventricular ejection fraction, by estimation, is 25%. The left  ventricle has severely decreased function. The left ventricle demonstrates  global hypokinesis. There is mild left ventricular hypertrophy. Left  ventricular diastolic parameters are  indeterminate.   2. Right ventricular systolic function is moderately reduced. The right  ventricular size is normal. Mildly increased right ventricular wall  thickness. There is severely elevated pulmonary  artery systolic pressure.  The estimated right ventricular  systolic pressure is 64.0 mmHg.   3. Left atrial size was moderately dilated.   4. The mitral valve is normal in structure. Moderate to severe mitral  valve regurgitation. No evidence of mitral stenosis.   5. Tricuspid valve regurgitation is moderate.   6. The aortic valve is tricuspid. Aortic valve regurgitation is not  visualized. No aortic stenosis is present.   7. The inferior vena cava is dilated in size with <50% respiratory  variability, suggesting right atrial pressure of 15 mmHg. __________   2D echo 07/03/2019: 1. The left ventricle has moderate-severely reduced systolic function,  with an  ejection fraction of 30-35%. The cavity size was mild to  moderately dilated. Findings are consistent with dilated cardiomyopathy.  Left ventricular diastolic parameters were  normal. Left ventricular diffuse hypokinesis.   2. The right ventricle has moderately reduced systolic function. The  cavity was moderately enlarged. There is no increase in right ventricular  wall thickness.   3. Left atrial size was mildly dilated.   4. Right atrial size was mildly dilated.   5. The mitral valve is grossly normal. Mild thickening of the mitral  valve leaflet. Mitral valve regurgitation is mild to moderate by color  flow Doppler.   6. The aortic valve is grossly normal.   7. Mild valvular pulmonic stenosis.   8. The aorta is normal in size and structure.   9. The interatrial septum was not assessed. __________   2D echo 07/08/2018: - Left ventricle: The cavity size was at the upper limits of    normal. Wall thickness was increased in a pattern of mild LVH.    Systolic function was mildly reduced. The estimated ejection    fraction was in the range of 45% to 50%. Diffuse hypokinesis.    Doppler parameters are consistent with abnormal left ventricular    relaxation (grade 1 diastolic dysfunction).  - Left atrium: The atrium was mildly  dilated.  - Right ventricle: The cavity size was mildly dilated. Systolic    function was normal.   Impressions:   - Compared with prior echo from 05/04/18, LVEF has significantly    improved. Mitral regurgitation has decreased. __________   TEE 05/07/2018: - Left ventricle: Systolic function was severely reduced. The    estimated ejection fraction was in the range of 20% to 25%.  - Aortic valve: There was trivial regurgitation.  - Aortic root: The aortic root was mildly dilated.  - Mitral valve: There was mild to moderate regurgitation.  - Left atrium: No evidence of thrombus in the atrial cavity or    appendage.  - Right ventricle: Systolic function was reduced.  - Right atrium: No evidence of thrombus in the atrial cavity or    appendage.  - Tricuspid valve: There was mild-moderate regurgitation.  - Pericardium, extracardiac: There was a small left pleural    effusion.   Impressions:   - Successful cardioversion.  ___________   [facility] 05/05/2018: 1.  Normal coronary arteries. 2.  Severely reduced LV systolic function by echo.  Left ventricular angiography was not performed. 3.  Right heart catheterization showed severely elevated filling pressures, moderate pulmonary hypertension and severely reduced cardiac output.  Cardiac output was 2.98 with a cardiac index of 1.47.   Recommendations: The patient has nonischemic cardiomyopathy likely tachycardia induced.  He continues to be significantly volume overloaded and I recommend continuing IV diuresis. Resume heparin drip 8 hours after sheath pull.  A DOAC can be started tomorrow.  I recommend TEE guided cardioversion before hospital discharge given degree of cardiomyopathy.  This can likely be done on Wednesday once volume overload improves.  I am going to start the patient on oral amiodarone to facilitate cardioversion. __________   2D echo 05/03/2018: - Left ventricle: The cavity size was normal. Systolic function was     severely reduced. The estimated ejection fraction was in the    range of 25% to 30%. Diffuse hypokinesis. Regional wall motion    abnormalities cannot be excluded. The study is not technically    sufficient to allow evaluation of LV diastolic function.  - Aortic root:  The aortic root was mildly dilated  - Mitral valve: There was moderate regurgitation.  - Left atrium: The atrium was moderately dilated.  - Right ventricle: The cavity size was mildly to moderately    dilated. Wall thickness was normal. Systolic function was mildly    reduced.  - Right atrium: The atrium was mildly dilated.  - Tricuspid valve: There was moderate regurgitation.  - Pulmonary arteries: Systolic pressure was moderately elevated PA    peak pressure: 52 mm Hg (S).   Impressions:   - Rhythm is atrial fibrillation.   EKG:  EKG is ordered today.  The EKG ordered today demonstrates sinus bradycardia, 50 bpm, left axis deviation, LVH, lateral T wave inversion consistent with prior tracings  Recent Labs: 04/06/2022: B Natriuretic Peptide 1,383.6 05/10/2022: ALT 15; BUN 15; Creatinine, Ser 0.98; Hemoglobin 14.1; Platelets 180; Potassium 4.5; Sodium 142; TSH 1.004  Recent Lipid Panel    Component Value Date/Time   CHOL 201 (H) 08/04/2020 0950   TRIG 110 08/04/2020 0950   HDL 63 08/04/2020 0950   CHOLHDL 3.2 08/04/2020 0950   CHOLHDL 3.3 05/04/2018 0138   VLDL 13 05/04/2018 0138   LDLCALC 119 (H) 08/04/2020 0950    PHYSICAL EXAM:    VS:  BP (!) 168/95 (BP Location: Right Arm, Patient Position: Sitting, Cuff Size: Normal)   Pulse (!) 50   Ht 5\' 9"  (1.753 m)   Wt 175 lb 12.8 oz (79.7 kg)   SpO2 97%   BMI 25.96 kg/m   BMI: Body mass index is 25.96 kg/m.  Physical Exam Vitals reviewed.  Constitutional:      Appearance: He is well-developed.  HENT:     Head: Normocephalic and atraumatic.  Eyes:     General:        Right eye: No discharge.        Left eye: No discharge.  Neck:     Vascular: No JVD.   Cardiovascular:     Rate and Rhythm: Regular rhythm. Bradycardia present.     Pulses:          Posterior tibial pulses are 2+ on the right side and 2+ on the left side.     Heart sounds: S1 normal and S2 normal. Heart sounds not distant. No midsystolic click and no opening snap. Murmur heard.     High-pitched blowing holosystolic murmur is present with a grade of 2/6 at the apex.     No friction rub.  Pulmonary:     Effort: Pulmonary effort is normal. No respiratory distress.     Breath sounds: Normal breath sounds. No decreased breath sounds, wheezing or rales.  Chest:     Chest wall: No tenderness.  Abdominal:     General: There is no distension.     Palpations: Abdomen is soft.     Tenderness: There is no abdominal tenderness.  Musculoskeletal:     Cervical back: Normal range of motion.     Right lower leg: No edema.     Left lower leg: No edema.  Skin:    General: Skin is warm and dry.     Nails: There is no clubbing.  Neurological:     Mental Status: He is alert and oriented to person, place, and time.  Psychiatric:        Speech: Speech normal.        Behavior: Behavior normal.        Thought Content: Thought content normal.  Judgment: Judgment normal.     Wt Readings from Last 3 Encounters:  06/15/22 175 lb 12.8 oz (79.7 kg)  05/17/22 168 lb (76.2 kg)  05/10/22 169 lb (76.7 kg)     ASSESSMENT & PLAN:   HFrEF secondary to NICM: He appears euvolemic and well compensated with NYHA class II symptoms.  We will escalate GDMT with the addition of losartan 25 mg daily.  He does have a documented intolerance to [facility] with noted "hypotension."  We will consider transitioning him from ARB with a rechallenge of Entresto and follow-up based on updated BP readings and lab values.  He will continue Toprol-XL 100 mg daily along with furosemide 40 mg twice daily.  Hopefully, now that he is maintaining sinus rhythm, we can de-escalate diuretic therapy.  Decrease KCl to 20 mEq  daily with the addition of ARB.  Check BMP today and again 1 week after initiating losartan.  The importance of follow-up labs and communication of recommendations thereafter was discussed with patient and family today.  Would look to continue escalation of GDMT as vitals and labs allow moving forward with possible addition of MRA and SGLT2 inhibitor thereafter.  We will look to repeat a limited echo in approximately 2 months time now that he is maintaining sinus rhythm and has been adherent to medical therapy.  Historically, he has had improvement in his EF while in sinus rhythm and with medication adherence.  However, if his cardiomyopathy persists, despite maintaining sinus rhythm and with medication adherence, we will plan to pursue ischemic evaluation, possible referral to the structural heart team for evaluation of mitral clinic, and referral to the advanced heart failure team.  CHF education.  Persistent A-fib/flutter: Maintaining sinus rhythm with a bradycardic rate.  Asymptomatic.  Given bradycardia and age, we will discontinue amiodarone.  Continue Toprol-XL as outlined above.  Given a CHA2DS2-VASc of at least 3 he remains on apixaban 5 mg twice daily (he does not meet reduced dosing criteria).  No falls or symptoms concerning for bleeding.  Recent CBC stable as outlined above.  Check BMP today.  Normal coronary arteries with elevated high-sensitivity troponin: No symptoms concerning for angina.  LHC in 2019 showed normal coronary arteries.  Prior elevated high-sensitivity troponin was felt to be likely secondary to volume overload and A-fib/flutter.  We will plan to pursue repeat echo in 2 months time as outlined above.  He remains on apixaban in place of aspirin to minimize bleeding risk.  Aggressive risk factor modification is recommended.  Valvular heart disease: Look to repeat an echo in approximately 2 months following restoration of sinus rhythm.  May need to consider referral to the  structural heart team in [place] as outlined above.  HTN: Blood pressure is mildly elevated in the office today, though typically well controlled.  We are adding losartan as outlined above.  Otherwise, he will continue on Toprol-XL.  Medication nonadherence: He reports adherence to his medications since his hospital discharge.  The importance of follow-up and medications have been discussed.  For any phone discussion, we should contact [place] at [phone].   Disposition: F/u with Dr. Kirke Corin or an APP in 1 month.   Medication Adjustments/Labs and Tests Ordered: Current medicines are reviewed at length with the patient today.  Concerns regarding medicines are outlined above. Medication changes, Labs and Tests ordered today are summarized above and listed in the Patient Instructions accessible in Encounters.   Signed, Eula Listen, PA-C 06/15/2022 8:59 AM     [facility] -  [address] [place], [place] [address] [phone]

## 2022-06-15 ENCOUNTER — Ambulatory Visit (INDEPENDENT_AMBULATORY_CARE_PROVIDER_SITE_OTHER): Payer: Medicare Other | Admitting: Physician Assistant

## 2022-06-15 ENCOUNTER — Encounter: Payer: Self-pay | Admitting: Physician Assistant

## 2022-06-15 ENCOUNTER — Other Ambulatory Visit
Admission: RE | Admit: 2022-06-15 | Discharge: 2022-06-15 | Disposition: A | Discharge status: Home / Self Care | Payer: Medicare Other | Source: Ambulatory Visit | Attending: Physician Assistant | Admitting: Physician Assistant

## 2022-06-15 VITALS — BP 168/95 | HR 50 | Ht 69.0 in | Wt 175.8 lb

## 2022-06-15 DIAGNOSIS — Z91148 Patient's other noncompliance with medication regimen for other reason: Secondary | ICD-10-CM | POA: Diagnosis not present

## 2022-06-15 DIAGNOSIS — I4819 Other persistent atrial fibrillation: Secondary | ICD-10-CM | POA: Insufficient documentation

## 2022-06-15 DIAGNOSIS — I428 Other cardiomyopathies: Secondary | ICD-10-CM | POA: Diagnosis not present

## 2022-06-15 DIAGNOSIS — I10 Essential (primary) hypertension: Secondary | ICD-10-CM | POA: Diagnosis not present

## 2022-06-15 DIAGNOSIS — I38 Endocarditis, valve unspecified: Secondary | ICD-10-CM

## 2022-06-15 DIAGNOSIS — I5022 Chronic systolic (congestive) heart failure: Secondary | ICD-10-CM

## 2022-06-15 LAB — BASIC METABOLIC PANEL
Anion gap: 9 (ref 5–15)
BUN: 16 mg/dL (ref 8–23)
CO2: 25 mmol/L (ref 22–32)
Calcium: 9 mg/dL (ref 8.9–10.3)
Chloride: 105 mmol/L (ref 98–111)
Creatinine, Ser: 0.89 mg/dL (ref 0.61–1.24)
GFR, Estimated: >60 mL/min (ref >60)
Glucose, Bld: 94 mg/dL (ref 70–99)
Potassium: 4.1 mmol/L (ref 3.5–5.1)
Sodium: 139 mmol/L (ref 135–145)

## 2022-06-15 MED ORDER — POTASSIUM CHLORIDE CRYS ER 20 MEQ PO TBCR
20.0000 meq | EXTENDED_RELEASE_TABLET | Freq: Every day | ORAL | 3 refills | Status: DC
Start: 2022-06-15 — End: 2023-11-07

## 2022-06-15 MED ORDER — METOPROLOL SUCCINATE ER 100 MG PO TB24: 100.0000 mg | ORAL_TABLET | Freq: Every day | ORAL | 3 refills | Status: DC

## 2022-06-15 MED ORDER — APIXABAN 5 MG PO TABS: 5.0000 mg | ORAL_TABLET | Freq: Two times a day (BID) | ORAL | 3 refills | Status: DC

## 2022-06-15 MED ORDER — FUROSEMIDE 40 MG PO TABS
40.0000 mg | ORAL_TABLET | Freq: Two times a day (BID) | ORAL | 3 refills | Status: DC
Start: 2022-06-15 — End: 2023-11-07

## 2022-06-15 MED ORDER — AMIODARONE HCL 200 MG PO TABS: 200.0000 mg | ORAL_TABLET | Freq: Every day | ORAL | 3 refills | Status: DC

## 2022-06-15 MED ORDER — LOSARTAN POTASSIUM 25 MG PO TABS: 25.0000 mg | ORAL_TABLET | Freq: Every day | ORAL | 3 refills | Status: DC

## 2022-06-15 NOTE — Patient Instructions (Addendum)
Medication Instructions:  Your physician has recommended you make the following change in your medication:   START losartan 25 mg once daily DECREASE Potassium to 20 mEq once daily   *If you need a refill on your cardiac medications before your next appointment, please call your pharmacy*   Lab Work: BMET today BMET in 1 week  These will be done over at the [employer] at [facility] then go to 1st desk on the right to check in (REGISTRATION). No appointment is needed.  Lab hours: Monday- Friday (7:30 am- 5:30 pm)  If you have labs (blood work) drawn today and your tests are completely normal, you will receive your results only by: MyChart Message (if you have MyChart) OR A paper copy in the mail If you have any lab test that is abnormal or we need to change your treatment, we will call you to review the results.   Testing/Procedures: None   Follow-Up: At [facility], you and your health needs are our priority.  As part of our continuing mission to provide you with exceptional heart care, we have created designated Provider Care Teams.  These Care Teams include your primary Cardiologist (physician) and Advanced Practice Providers (APPs -  Physician Assistants and Nurse Practitioners) who all work together to provide you with the care you need, when you need it.  We recommend signing up for the patient portal called "MyChart".  Sign up information is provided on this After Visit Summary.  MyChart is used to connect with patients for Virtual Visits (Telemedicine).  Patients are able to view lab/test results, encounter notes, upcoming appointments, etc.  Non-urgent messages can be sent to your provider as well.   To learn more about what you can do with MyChart, go to [email or web address].    Your next appointment:   1 month(s)  The format for your next appointment:   In Person  Provider:   Lorine Bears, MD or Eula Listen, PA-C        Important Information About  [employer]

## 2022-06-22 ENCOUNTER — Telehealth: Payer: Self-pay | Admitting: Physician Assistant

## 2022-06-22 NOTE — Telephone Encounter (Signed)
Left detailed voicemail message that patient is due for some labs to be done over at the [facility] and to call back if any questions.

## 2022-07-14 NOTE — Progress Notes (Unsigned)
Cardiology Office Note    Date:  07/16/2022   ID:  Donald Molina, DOB 25-Jun-1951, MRN [id number]  PCP:  Pcp, No  Cardiologist:  Lorine Bears, MD  Electrophysiologist:  None   Chief Complaint: Follow up  History of Present Illness:   Donald Molina is a 71 y.o. male with history of normal coronary arteries by LHC in 04/2018, HFrEF secondary to NICM, persistent A-fib status post DCCV in 04/2022, HTN, and medication noncompliance leading to recurrent hospital admissions who presents for follow-up of his cardiomyopathy and A-fib.   He was admitted to the hospital in 04/2018 with A-fib and volume overload.  Echo demonstrated an EF of 25 to 30%.  Cardiac cath showed normal coronary arteries.  Following diuresis and amiodarone loading, he underwent successful DCCV.  He had recurrent A-fib following discharge in the setting of medication noncompliance with subsequent successful cardioversion thereafter following initiation of medications.  Follow-up echo in 06/2018 showed an improved LVEF of 45 to 50%.  In 06/2019, he was readmitted with dyspnea recurrent A-fib in the setting of medication noncompliance.  EF was again found to be reduced at 30 to 35% with diffuse hypokinesis.  He was diuresed and placed back on beta-blocker and apixaban with plan for rhythm management in the outpatient setting.  He was seen in the office in 07/2020 and was noted to be in sinus rhythm.  He had been lost to follow-up since then until he was admitted to the hospital in May of this year.   He was admitted to the hospital in 03/2022 with volume overload and recurrent A-fib in the context of medication noncompliance.  He reported having not filled his medications for at least a year.  High-sensitivity troponin peaked at 80 with subsequent downtrend.  BNP 1383.  Echo demonstrated an EF of 25%, global hypokinesis with LVH, indeterminate LV diastolic function parameters, moderately reduced RV systolic function with normal  ventricular cavity size and mildly increased ventricular wall thickness, elevated PASP estimated at 64 mmHg, moderately dilated left atrium moderate to severe mitral valve regurgitation, moderate tricuspid regurgitation, and an estimated right atrial pressure 15 mmHg.  He underwent diuresis and escalation of GDMT.  He subsequently underwent unusuccessful TEE guided DCCV with 2 shocks at 200 J.  Following this, it was recommended he be loaded with amiodarone with plans for repeat attempt of DCCV in the outpatient setting.   He was seen in hospital follow-up on 04/16/2022 and was without symptoms of angina or decompensation.  His weight remained stable by his home scale at 168 pounds.  He reported adherence to medications.  We were planning to escalate GDMT based on updated renal function/electrolytes.  Labs obtained at that time showed mild AKI with recommendation to decrease furosemide and KCl and defer escalation of GDMT until labs could be trended.  However, we were unable to get in contact with the patient, therefore a letter was sent to him.   He was seen in the office on 05/10/2022 and was without symptoms of angina or decompensation.  He was adherent to his medications.  Escalation of GDMT was deferred given concerns regarding follow-up of labs, as we had been unable to get in touch with him via phone previously.  He underwent successful DCCV on 05/17/2022.  He was last seen in the office on 06/15/2022, and was without symptoms of angina or decompensation.  He reported adherence to all medications.  He continued to note gradual improvement in his functional status.  He was  started on losartan 25 mg with follow labs not completed.   He comes in doing well from a cardiac perspective and is without symptoms of angina or decompensation.  No dyspnea, palpitations, dizziness, presyncope, syncope, lower extremity swelling, abdominal distention, orthopnea, or PND.  No falls, hematochezia, or melena.  He reports  adherence to all medications.  He is watching his salt intake and continues to drink a little over 2 L of liquid per day.  He continues to feel better than he has in quite some time.  He does not have any active cardiac issues or concerns at this time.     Labs independently reviewed: 05/2022 - potassium 4.1, BUN 16, SCr 0.89 04/2022 - Hgb 14.1, PLT 180, albumin 4.0, AST/ALT normal, TSH normal 07/2020 - TC 201, TG 110, HDL 63, LDL 119    Past Medical History:  Diagnosis Date   HFrEF (heart failure with reduced ejection fraction) (HCC)    a. 04/2018 Echo: EF 25-30%; b. 06/2018 Echo: EF 45-50%. diff HK, Gr1 DD. Nl RV fxn; c. 06/2019 Echo: EF 30-35%, diff HK, mod reduced RV fxn, mild BAE, mild-mod MR, mild PS.   Hypertension    NICM (nonischemic cardiomyopathy) (HCC)    a. 04/2018 Echo: EF 25-30%; b. 06/2018 Echo: EF 45-50%; c. 06/2019 Echo: EF 30-35%.   Persistent atrial fibrillation (HCC)    a. Dx 04/2018 s/p DCCV and amio loading. CHA2DS2VASc 3-->eliquis.    Past Surgical History:  Procedure Laterality Date   CARDIAC CATHETERIZATION     CARDIOVERSION N/A 04/09/2022   Procedure: CARDIOVERSION;  Surgeon: Debbe Odea, MD;  Location: [facility] ORS;  Service: Cardiovascular;  Laterality: N/A;   CARDIOVERSION N/A 05/17/2022   Procedure: CARDIOVERSION;  Surgeon: Antonieta Iba, MD;  Location: [facility] ORS;  Service: Cardiovascular;  Laterality: N/A;   RIGHT/LEFT HEART CATH AND CORONARY ANGIOGRAPHY N/A 05/05/2018   Procedure: RIGHT/LEFT HEART CATH AND CORONARY ANGIOGRAPHY;  Surgeon: Iran Ouch, MD;  Location: [facility] INVASIVE CV [facility];  Service: Cardiovascular;  Laterality: N/A;   TEE WITHOUT CARDIOVERSION N/A 05/07/2018   Procedure: TRANSESOPHAGEAL ECHOCARDIOGRAM (TEE);  Surgeon: Yvonne Kendall, MD;  Location: [facility] ORS;  Service: Cardiovascular;  Laterality: N/A;   TEE WITHOUT CARDIOVERSION N/A 04/09/2022   Procedure: TRANSESOPHAGEAL ECHOCARDIOGRAM (TEE);  Surgeon: Debbe Odea, MD;  Location:  [facility] ORS;  Service: Cardiovascular;  Laterality: N/A;    Current Medications: Current Meds  Medication Sig   apixaban (ELIQUIS) 5 MG TABS tablet Take 1 tablet (5 mg total) by mouth 2 (two) times daily.   furosemide (LASIX) 40 MG tablet Take 1 tablet (40 mg total) by mouth 2 (two) times daily.   losartan (COZAAR) 25 MG tablet Take 1 tablet (25 mg total) by mouth daily.   metoprolol succinate (TOPROL-XL) 100 MG 24 hr tablet Take 1 tablet (100 mg total) by mouth daily. Take with or immediately following a meal.   potassium chloride SA (KLOR-CON M) 20 MEQ tablet Take 1 tablet (20 mEq total) by mouth daily.    Allergies:   Entresto [sacubitril-valsartan]   Social History   Socioeconomic History   Marital status: Divorced    Spouse name: Not on file   Number of children: 2   Years of education: 12   Highest education level: 12th grade  Occupational History   Occupation: retired  Tobacco Use   Smoking status: Former    Years: 10.00    Types: Cigarettes    Quit date: 05/28/1998    Years since quitting: 24.1  Smokeless tobacco: Former    Types: Chew    Quit date: 05/28/1998  Vaping Use   Vaping Use: Never used  Substance and Sexual Activity   Alcohol use: Yes    Comment: 2-3, 40 oz beers/wk   Drug use: Never   Sexual activity: Yes    Birth control/protection: Condom  Other Topics Concern   Not on file  Social History Narrative   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   Social Determinants of Health   Financial Resource Strain: Low Risk  (05/28/2018)   Overall Financial Resource Strain (CARDIA)    Difficulty of Paying Living Expenses: Not hard at all  Food Insecurity: No Food Insecurity (05/28/2018)   Hunger Vital Sign    Worried About Running Out of Food in the Last Year: Never true    Ran Out of Food in the Last Year: Never true  Transportation Needs: No Transportation Needs (05/28/2018)   PRAPARE - Administrator, Civil Service (Medical):  No    Lack of Transportation (Non-Medical): No  Physical Activity: Insufficiently Active (05/28/2018)   Exercise Vital Sign    Days of Exercise per Week: 7 days    Minutes of Exercise per Session: 20 min  Stress: No Stress Concern Present (05/28/2018)   [employer] of Occupational Health - Occupational Stress Questionnaire    Feeling of Stress : Not at all  Social Connections: Not on file     Family History:  The patient's family history includes Cancer in his mother; Cervical cancer in his mother; Dementia in his father; Heart disease in his sister; Heart failure in his father.  ROS:   12-point review of systems is negative unless otherwise noted in the HPI.   EKGs/Labs/Other Studies Reviewed:    Studies reviewed were summarized above. The additional studies were reviewed today:  TEE 04/09/2022: 1. Left ventricular ejection fraction, by estimation, is 20 to 25%. The  left ventricle has severely decreased function. The left ventricle  demonstrates global hypokinesis.   2. Right ventricular systolic function is moderately reduced. The right  ventricular size is normal.   3. Left atrial size was mild to moderately dilated. No left atrial/left  atrial appendage thrombus was detected.   4. Right atrial size was moderately dilated.   5. The mitral valve is normal in structure. Mild to moderate mitral valve  regurgitation.   6. The aortic valve is tricuspid. Aortic valve regurgitation is not  visualized. __________   2D echo 04/06/2022: 1. Left ventricular ejection fraction, by estimation, is 25%. The left  ventricle has severely decreased function. The left ventricle demonstrates  global hypokinesis. There is mild left ventricular hypertrophy. Left  ventricular diastolic parameters are  indeterminate.   2. Right ventricular systolic function is moderately reduced. The right  ventricular size is normal. Mildly increased right ventricular wall  thickness. There is severely  elevated pulmonary artery systolic pressure.  The estimated right ventricular  systolic pressure is 64.0 mmHg.   3. Left atrial size was moderately dilated.   4. The mitral valve is normal in structure. Moderate to severe mitral  valve regurgitation. No evidence of mitral stenosis.   5. Tricuspid valve regurgitation is moderate.   6. The aortic valve is tricuspid. Aortic valve regurgitation is not  visualized. No aortic stenosis is present.   7. The inferior vena cava is dilated in size with <50% respiratory  variability, suggesting right atrial pressure of 15 mmHg. __________  2D echo 07/03/2019: 1. The left ventricle has moderate-severely reduced systolic function,  with an ejection fraction of 30-35%. The cavity size was mild to  moderately dilated. Findings are consistent with dilated cardiomyopathy.  Left ventricular diastolic parameters were  normal. Left ventricular diffuse hypokinesis.   2. The right ventricle has moderately reduced systolic function. The  cavity was moderately enlarged. There is no increase in right ventricular  wall thickness.   3. Left atrial size was mildly dilated.   4. Right atrial size was mildly dilated.   5. The mitral valve is grossly normal. Mild thickening of the mitral  valve leaflet. Mitral valve regurgitation is mild to moderate by color  flow Doppler.   6. The aortic valve is grossly normal.   7. Mild valvular pulmonic stenosis.   8. The aorta is normal in size and structure.   9. The interatrial septum was not assessed. __________   2D echo 07/08/2018: - Left ventricle: The cavity size was at the upper limits of    normal. Wall thickness was increased in a pattern of mild LVH.    Systolic function was mildly reduced. The estimated ejection    fraction was in the range of 45% to 50%. Diffuse hypokinesis.    Doppler parameters are consistent with abnormal left ventricular    relaxation (grade 1 diastolic dysfunction).  - Left atrium: The  atrium was mildly dilated.  - Right ventricle: The cavity size was mildly dilated. Systolic    function was normal.   Impressions:   - Compared with prior echo from 05/04/18, LVEF has significantly    improved. Mitral regurgitation has decreased. __________   TEE 05/07/2018: - Left ventricle: Systolic function was severely reduced. The    estimated ejection fraction was in the range of 20% to 25%.  - Aortic valve: There was trivial regurgitation.  - Aortic root: The aortic root was mildly dilated.  - Mitral valve: There was mild to moderate regurgitation.  - Left atrium: No evidence of thrombus in the atrial cavity or    appendage.  - Right ventricle: Systolic function was reduced.  - Right atrium: No evidence of thrombus in the atrial cavity or    appendage.  - Tricuspid valve: There was mild-moderate regurgitation.  - Pericardium, extracardiac: There was a small left pleural    effusion.   Impressions:   - Successful cardioversion.  ___________   [facility] 05/05/2018: 1.  Normal coronary arteries. 2.  Severely reduced LV systolic function by echo.  Left ventricular angiography was not performed. 3.  Right heart catheterization showed severely elevated filling pressures, moderate pulmonary hypertension and severely reduced cardiac output.  Cardiac output was 2.98 with a cardiac index of 1.47.   Recommendations: The patient has nonischemic cardiomyopathy likely tachycardia induced.  He continues to be significantly volume overloaded and I recommend continuing IV diuresis. Resume heparin drip 8 hours after sheath pull.  A DOAC can be started tomorrow.  I recommend TEE guided cardioversion before hospital discharge given degree of cardiomyopathy.  This can likely be done on Wednesday once volume overload improves.  I am going to start the patient on oral amiodarone to facilitate cardioversion. __________   2D echo 05/03/2018: - Left ventricle: The cavity size was normal. Systolic  function was    severely reduced. The estimated ejection fraction was in the    range of 25% to 30%. Diffuse hypokinesis. Regional wall motion    abnormalities cannot be excluded. The study is not technically  sufficient to allow evaluation of LV diastolic function.  - Aortic root: The aortic root was mildly dilated  - Mitral valve: There was moderate regurgitation.  - Left atrium: The atrium was moderately dilated.  - Right ventricle: The cavity size was mildly to moderately    dilated. Wall thickness was normal. Systolic function was mildly    reduced.  - Right atrium: The atrium was mildly dilated.  - Tricuspid valve: There was moderate regurgitation.  - Pulmonary arteries: Systolic pressure was moderately elevated PA    peak pressure: 52 mm Hg (S).   Impressions:   - Rhythm is atrial fibrillation.   EKG:  EKG is ordered today.  The EKG ordered today demonstrates NSR, 70 bpm, left anterior fascicular block, LVH, and nonspecific ST-T changes improved from prior tracing  Recent Labs: 04/06/2022: B Natriuretic Peptide 1,383.6 05/10/2022: ALT 15; Hemoglobin 14.1; Platelets 180; TSH 1.004 07/16/2022: BUN 19; Creatinine, Ser 1.09; Potassium 4.2; Sodium 137  Recent Lipid Panel    Component Value Date/Time   CHOL 201 (H) 08/04/2020 0950   TRIG 110 08/04/2020 0950   HDL 63 08/04/2020 0950   CHOLHDL 3.2 08/04/2020 0950   CHOLHDL 3.3 05/04/2018 0138   VLDL 13 05/04/2018 0138   LDLCALC 119 (H) 08/04/2020 0950    PHYSICAL EXAM:    VS:  BP 136/80 (BP Location: Left Arm, Patient Position: Sitting, Cuff Size: Normal)   Pulse 70   Ht  (1.803 m)   Wt 178 lb 4 oz (80.9 kg)   SpO2 98%   BMI 24.86 kg/m   BMI: Body mass index is 24.86 kg/m.  Physical Exam Vitals reviewed.  Constitutional:      Appearance: He is well-developed.  HENT:     Head: Normocephalic and atraumatic.  Eyes:     General:        Right eye: No discharge.        Left eye: No discharge.  Neck:      Vascular: No JVD.  Cardiovascular:     Rate and Rhythm: Normal rate and regular rhythm.     Pulses:          Posterior tibial pulses are 2+ on the right side and 2+ on the left side.     Heart sounds: S1 normal and S2 normal. Heart sounds not distant. No midsystolic click and no opening snap. Murmur heard.     High-pitched blowing holosystolic murmur is present with a grade of 2/6 at the apex.     No friction rub.  Pulmonary:     Effort: Pulmonary effort is normal. No respiratory distress.     Breath sounds: Normal breath sounds. No decreased breath sounds, wheezing or rales.  Chest:     Chest wall: No tenderness.  Abdominal:     General: There is no distension.  Musculoskeletal:     Cervical back: Normal range of motion.     Right lower leg: No edema.     Left lower leg: No edema.  Skin:    General: Skin is warm and dry.     Nails: There is no clubbing.  Neurological:     Mental Status: He is alert and oriented to person, place, and time.  Psychiatric:        Speech: Speech normal.        Behavior: Behavior normal.        Thought Content: Thought content normal.        Judgment: Judgment normal.  Wt Readings from Last 3 Encounters:  07/16/22 178 lb 4 oz (80.9 kg)  06/15/22 175 lb 12.8 oz (79.7 kg)  05/17/22 168 lb (76.2 kg)     ASSESSMENT & PLAN:   HFrEF secondary to NICM: He continues to appear euvolemic and well compensated with NYHA class II symptoms.  Escalation of GDMT has been limited by laboratory follow-up and with our ability to get in contact with the patient/family for recommendations based on labs.  We will obtain a BMP today for follow-up of losartan which was started at last visit given follow-up BMP was not obtained 1 week after initiating therapy.  Otherwise, he will remain on Toprol-XL and furosemide with KCl.  I do have concerns regarding further escalation of GDMT with MRA or SGLT2 inhibitor given lack of laboratory follow-up.  We will repeat a limited  echo in 1 month on GDMT and while maintaining sinus rhythm with adherence to medical therapy.  Historically, he has had improvement in his EF when in sinus rhythm and with medication adherence.  However, if his cardiomyopathy persists, despite maintaining sinus rhythm and with medication adherence, we will need to pursue ischemic evaluation with possible referral to the structural heart team for evaluation of his mitral valve along with referral to the advanced heart failure team.  CHF education.  Persistent Afib/flutter: Maintaining sinus rhythm on Toprol-XL.  CHA2DS2-VASc 3.  He remains on apixaban 5 mg twice daily (he does not meet reduced dosing criteria).  No falls or symptoms concerning for bleeding.  Recent CBC stable as outlined above.  Normal coronary arteries: No symptoms concerning for angina.  LHC in 2019 showed normal coronary arteries.  Repeat limited echo as outlined above.  He remains on apixaban in place of aspirin to minimize bleeding risk.  Aggressive risk factor modification recommended.  Valvular heart disease: Update limited echo as outlined above following restoration of sinus rhythm.  May need to consider referral to the structural heart team as outlined above.  HTN: Blood pressure is reasonably controlled in the office today.  He remains on losartan and Toprol-XL.  Medication nonadherence: He reports adherence to his medications.  The importance of follow-up and medications have been discussed in detail.    Disposition: F/u with Dr. Kirke Corin or an APP in 2 months.   Medication Adjustments/Labs and Tests Ordered: Current medicines are reviewed at length with the patient today.  Concerns regarding medicines are outlined above. Medication changes, Labs and Tests ordered today are summarized above and listed in the Patient Instructions accessible in Encounters.   Signed, Eula Listen, PA-C 07/16/2022 10:06 AM     [facility] [place] [address] [place], [place] [address] [phone]

## 2022-07-16 ENCOUNTER — Other Ambulatory Visit
Admission: RE | Admit: 2022-07-16 | Discharge: 2022-07-16 | Disposition: A | Discharge status: Home / Self Care | Payer: Medicare Other | Attending: Physician Assistant | Admitting: Physician Assistant

## 2022-07-16 ENCOUNTER — Ambulatory Visit (INDEPENDENT_AMBULATORY_CARE_PROVIDER_SITE_OTHER): Payer: Medicare Other | Admitting: Physician Assistant

## 2022-07-16 ENCOUNTER — Encounter: Payer: Self-pay | Admitting: Physician Assistant

## 2022-07-16 VITALS — BP 136/80 | HR 70 | Ht 71.0 in | Wt 178.2 lb

## 2022-07-16 DIAGNOSIS — I10 Essential (primary) hypertension: Secondary | ICD-10-CM | POA: Diagnosis not present

## 2022-07-16 DIAGNOSIS — I428 Other cardiomyopathies: Secondary | ICD-10-CM

## 2022-07-16 DIAGNOSIS — I5022 Chronic systolic (congestive) heart failure: Secondary | ICD-10-CM | POA: Diagnosis not present

## 2022-07-16 DIAGNOSIS — I4819 Other persistent atrial fibrillation: Secondary | ICD-10-CM

## 2022-07-16 DIAGNOSIS — I38 Endocarditis, valve unspecified: Secondary | ICD-10-CM

## 2022-07-16 DIAGNOSIS — Z91148 Patient's other noncompliance with medication regimen for other reason: Secondary | ICD-10-CM | POA: Diagnosis not present

## 2022-07-16 LAB — BASIC METABOLIC PANEL
Anion gap: 8 (ref 5–15)
BUN: 19 mg/dL (ref 8–23)
CO2: 24 mmol/L (ref 22–32)
Calcium: 9.2 mg/dL (ref 8.9–10.3)
Chloride: 105 mmol/L (ref 98–111)
Creatinine, Ser: 1.09 mg/dL (ref 0.61–1.24)
GFR, Estimated: >60 mL/min (ref >60)
Glucose, Bld: 96 mg/dL (ref 70–99)
Potassium: 4.2 mmol/L (ref 3.5–5.1)
Sodium: 137 mmol/L (ref 135–145)

## 2022-07-16 NOTE — Patient Instructions (Addendum)
Medication Instructions:  Your physician recommends that you continue on your current medications as directed. Please refer to the Current Medication list given to you today.  *If you need a refill on your cardiac medications before your next appointment, please call your pharmacy*   Lab Work: BMET today over at the [facility] entrance then check in at registration.   If you have labs (blood work) drawn today and your tests are completely normal, you will receive your results only by: MyChart Message (if you have MyChart) OR A paper copy in the mail If you have any lab test that is abnormal or we need to change your treatment, we will call you to review the results.   Testing/Procedures: Your physician has requested that you have an Limited Echocardiogram in one month.   Echocardiography is a painless test that uses sound waves to create images of your heart. It provides your doctor with information about the size and shape of your heart and how well your heart's chambers and valves are working. This procedure takes approximately one hour. There are no restrictions for this procedure.    Follow-Up: At [facility], you and your health needs are our priority.  As part of our continuing mission to provide you with exceptional heart care, we have created designated Provider Care Teams.  These Care Teams include your primary Cardiologist (physician) and Advanced Practice Providers (APPs -  Physician Assistants and Nurse Practitioners) who all work together to provide you with the care you need, when you need it.  We recommend signing up for the patient portal called "MyChart".  Sign up information is provided on this After Visit Summary.  MyChart is used to connect with patients for Virtual Visits (Telemedicine).  Patients are able to view lab/test results, encounter notes, upcoming appointments, etc.  Non-urgent messages can be sent to your provider as well.   To learn more about what  you can do with MyChart, go to [email or web address].    Your next appointment:   2 month(s)  The format for your next appointment:   In Person  Provider:   Lorine Bears, MD or Eula Listen, PA-C      Important Information About Sugar

## 2022-08-15 ENCOUNTER — Ambulatory Visit: Payer: Medicare Other | Attending: Physician Assistant

## 2022-08-15 DIAGNOSIS — I5020 Unspecified systolic (congestive) heart failure: Secondary | ICD-10-CM | POA: Diagnosis not present

## 2022-08-15 DIAGNOSIS — I110 Hypertensive heart disease with heart failure: Secondary | ICD-10-CM | POA: Insufficient documentation

## 2022-08-15 DIAGNOSIS — Z87891 Personal history of nicotine dependence: Secondary | ICD-10-CM | POA: Diagnosis not present

## 2022-08-15 DIAGNOSIS — I4891 Unspecified atrial fibrillation: Secondary | ICD-10-CM | POA: Diagnosis not present

## 2022-08-15 DIAGNOSIS — I428 Other cardiomyopathies: Secondary | ICD-10-CM | POA: Insufficient documentation

## 2022-08-15 LAB — ECHOCARDIOGRAM LIMITED
Area-P 1/2: 1.63 cm2
Calc EF: 50.1 %
S' Lateral: 4 cm
Single Plane A2C EF: 51.2 %
Single Plane A4C EF: 50.1 %

## 2022-08-16 ENCOUNTER — Telehealth: Payer: Self-pay | Admitting: *Deleted

## 2022-08-16 MED ORDER — METOPROLOL SUCCINATE ER 100 MG PO TB24: 50.0000 mg | ORAL_TABLET | Freq: Every day | ORAL | 3 refills | Status: DC

## 2022-08-16 NOTE — Telephone Encounter (Signed)
Reviewed results and recommendations with patients sister per release form. Discussed instructions to cut toprol in half. She repeated this information back to me with no further questions at this time.

## 2022-08-16 NOTE — Telephone Encounter (Signed)
Attempted to call patient on both numbers listed. Unable to reach patient and could not leave a message.

## 2022-08-16 NOTE — Telephone Encounter (Signed)
Pt sister returning call  °

## 2022-08-16 NOTE — Telephone Encounter (Signed)
-----   Message from Kavin Leech, RN sent at 08/15/2022  4:48 PM EDT -----  ----- Message ----- From: Rise Mu, PA-C Sent: 08/15/2022   3:09 PM EDT To: [facility] Triage  Please inform the patient his echo showed a low normal pump function of 50 to 55%, normal wall motion, slightly stiffened heart, normal right-sided pump function, no significant valvular abnormalities, and a normal pressure within the upper right chamber of the heart.  He was bradycardic during the study.  When compared to prior echo from 03/2022, while in A-fib, his pump function has improved and is now back to low normal.  With regards to his bradycardia noted during the study, please have him reduce metoprolol succinate to 50 mg daily.  Follow-up as planned next month.  Overall very good news.

## 2022-08-16 NOTE — Telephone Encounter (Signed)
Patients sister will give him a call with instructions to give [place] a call back.

## 2022-09-18 NOTE — Progress Notes (Signed)
Cardiology Office Note    Date:  09/21/2022   ID:  Donald Molina, DOB Sep 01, 1951, MRN [id number]  PCP:  Pcp, No  Cardiologist:  Lorine Bears, MD  Electrophysiologist:  None   Chief Complaint: Follow up  History of Present Illness:   Donald Molina is a 71 y.o. male with history of normal coronary arteries by LHC in 04/2018, HFrEF secondary to NICM, persistent A-fib status post DCCV in 04/2022, HTN, and medication noncompliance leading to recurrent hospital admissions who presents for follow-up of his cardiomyopathy and A-fib.   He was admitted to the hospital in 04/2018 with A-fib and volume overload.  Echo demonstrated an EF of 25 to 30%.  Cardiac cath showed normal coronary arteries.  Following diuresis and amiodarone loading, he underwent successful DCCV.  He had recurrent A-fib following discharge in the setting of medication noncompliance with subsequent successful cardioversion thereafter following initiation of medications.  Follow-up echo in 06/2018 showed an improved LVEF of 45 to 50%.  In 06/2019, he was readmitted with dyspnea recurrent A-fib in the setting of medication noncompliance.  EF was again found to be reduced at 30 to 35% with diffuse hypokinesis.  He was diuresed and placed back on beta-blocker and apixaban with plan for rhythm management in the outpatient setting.  He was seen in the office in 07/2020 and was noted to be in sinus rhythm.  He had been lost to follow-up since then until he was admitted to the hospital in May of this year.   He was admitted to the hospital in 03/2022 with volume overload and recurrent A-fib in the context of medication noncompliance.  He reported having not filled his medications for at least a year.  High-sensitivity troponin peaked at 80 with subsequent downtrend.  BNP 1383.  Echo demonstrated an EF of 25%, global hypokinesis with LVH, indeterminate LV diastolic function parameters, moderately reduced RV systolic function with normal  ventricular cavity size and mildly increased ventricular wall thickness, elevated PASP estimated at 64 mmHg, moderately dilated left atrium moderate to severe mitral valve regurgitation, moderate tricuspid regurgitation, and an estimated right atrial pressure 15 mmHg.  He underwent diuresis and escalation of GDMT.  He subsequently underwent unusuccessful TEE guided DCCV with 2 shocks at 200 J.  Following this, it was recommended he be loaded with amiodarone with plans for repeat attempt of DCCV in the outpatient setting.   He was seen in hospital follow-up on 04/16/2022 and was without symptoms of angina or decompensation.  His weight remained stable by his home scale at 168 pounds.  He reported adherence to medications.  We were planning to escalate GDMT based on updated renal function/electrolytes.  Labs obtained at that time showed mild AKI with recommendation to decrease furosemide and KCl and defer escalation of GDMT until labs could be trended.  However, we were unable to get in contact with the patient, therefore a letter was sent to him.   He was seen in the office on 05/10/2022 and was without symptoms of angina or decompensation.  He was adherent to his medications.  Escalation of GDMT was deferred given concerns regarding follow-up of labs, as we had been unable to get in touch with him via phone previously.  He underwent successful DCCV on 05/17/2022.   He was seen in the office on 06/15/2022, and was without symptoms of angina or decompensation.  He reported adherence to all medications.  He continued to note gradual improvement in his functional status.  He was  started on losartan 25 mg with follow labs not completed.   He was last seen in the office in 06/2022 and remained without symptoms of angina or decompensation.  He continued to report adherence to all medications.  Further escalation of GDMT remained limited due to concerns regarding patient follow-up.  Subsequent echo on 08/15/2022  demonstrated an improvement in his LV systolic function with an EF of 50 to 55%, no regional wall motion abnormalities, grade 1 diastolic dysfunction, normal RV systolic function and ventricular cavity size, no significant valvular abnormalities, and an estimated right atrial pressure of 3 mmHg.  Patient was bradycardic in the study with a rate of 44 bpm.  Given this, it was recommended he reduce Toprol-XL to 50 mg daily.   He comes in doing well from a cardiac perspective, and is without symptoms of angina or decompensation.  He did not reduce his metoprolol as outlined above.  He is without symptoms of dyspnea, palpitations, dizziness, presyncope, syncope, lower extremity swelling, abdominal distention, orthopnea, or PND.  No falls, hematochezia, or melena.  He does watch his salt intake, though continues to drink a little over 2 L of liquid per day.  He reports adherence to all cardiac medications.  His weight is down 2 pounds today when compared to his last office visit.  He does not have any active cardiac issues or concerns at this time.  Family member present also feels like the patient is doing well.   Labs independently reviewed: 06/2022 - potassium 4.2, BUN 19, serum creatinine 1.09 04/2022 - Hgb 14.1, PLT 180, albumin 4.0, AST/ALT normal, TSH normal 07/2020 - TC 201, TG 110, HDL 63, LDL 119  Past Medical History:  Diagnosis Date   HFrEF (heart failure with reduced ejection fraction) ([facility])    a. 04/2018 Echo: EF 25-30%; b. 06/2018 Echo: EF 45-50%. diff HK, Gr1 DD. Nl RV fxn; c. 06/2019 Echo: EF 30-35%, diff HK, mod reduced RV fxn, mild BAE, mild-mod MR, mild PS.   Hypertension    NICM (nonischemic cardiomyopathy) ([facility])    a. 04/2018 Echo: EF 25-30%; b. 06/2018 Echo: EF 45-50%; c. 06/2019 Echo: EF 30-35%.   Persistent atrial fibrillation ([facility])    a. Dx 04/2018 s/p DCCV and amio loading. CHA2DS2VASc 3-->eliquis.    Past Surgical History:  Procedure Laterality Date   CARDIAC CATHETERIZATION      CARDIOVERSION N/A 04/09/2022   Procedure: CARDIOVERSION;  Surgeon: Kate Sable, MD;  Location: [facility];  Service: Cardiovascular;  Laterality: N/A;   CARDIOVERSION N/A 05/17/2022   Procedure: CARDIOVERSION;  Surgeon: Minna Merritts, MD;  Location: [facility];  Service: Cardiovascular;  Laterality: N/A;   RIGHT/LEFT HEART CATH AND CORONARY ANGIOGRAPHY N/A 05/05/2018   Procedure: RIGHT/LEFT HEART CATH AND CORONARY ANGIOGRAPHY;  Surgeon: Wellington Hampshire, MD;  Location: [facility];  Service: Cardiovascular;  Laterality: N/A;   TEE WITHOUT CARDIOVERSION N/A 05/07/2018   Procedure: TRANSESOPHAGEAL ECHOCARDIOGRAM (TEE);  Surgeon: Nelva Bush, MD;  Location: [facility];  Service: Cardiovascular;  Laterality: N/A;   TEE WITHOUT CARDIOVERSION N/A 04/09/2022   Procedure: TRANSESOPHAGEAL ECHOCARDIOGRAM (TEE);  Surgeon: Kate Sable, MD;  Location: [facility];  Service: Cardiovascular;  Laterality: N/A;    Current Medications: Current Meds  Medication Sig   apixaban (ELIQUIS) 5 MG TABS tablet Take 1 tablet (5 mg total) by mouth 2 (two) times daily.   furosemide (LASIX) 40 MG tablet Take 1 tablet (40 mg total) by mouth 2 (two) times daily.   losartan (COZAAR)  25 MG tablet Take 1 tablet (25 mg total) by mouth daily.   metoprolol succinate (TOPROL-XL) 100 MG 24 hr tablet Take 0.5 tablets (50 mg total) by mouth daily. Take with or immediately following a meal.   potassium chloride SA (KLOR-CON M) 20 MEQ tablet Take 1 tablet (20 mEq total) by mouth daily.    Allergies:   Entresto [sacubitril-valsartan]   Social History   Socioeconomic History   Marital status: Divorced    Spouse name: Not on file   Number of children: 2   Years of education: 12   Highest education level: 12th grade  Occupational History   Occupation: retired  Tobacco Use   Smoking status: Former    Years: 10.00    Types: Cigarettes    Quit date: 05/28/1998    Years since quitting: 24.3   Smokeless tobacco:  Former    Types: Chew    Quit date: 05/28/1998  Vaping Use   Vaping Use: Never used  Substance and Sexual Activity   Alcohol use: Yes    Comment: 2-3, 40 oz beers/wk   Drug use: Never   Sexual activity: Yes    Birth control/protection: Condom  Other Topics Concern   Not on file  Social History Narrative   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   Social Determinants of Health   Financial Resource Strain: Low Risk  (05/28/2018)   Overall Financial Resource Strain (CARDIA)    Difficulty of Paying Living Expenses: Not hard at all  Food Insecurity: No Food Insecurity (05/28/2018)   Hunger Vital Sign    Worried About Running Out of Food in the Last Year: Never true    Ran Out of Food in the Last Year: Never true  Transportation Needs: No Transportation Needs (05/28/2018)   PRAPARE - Administrator, Civil Service (Medical): No    Lack of Transportation (Non-Medical): No  Physical Activity: Insufficiently Active (05/28/2018)   Exercise Vital Sign    Days of Exercise per Week: 7 days    Minutes of Exercise per Session: 20 min  Stress: No Stress Concern Present (05/28/2018)   [employer] of Occupational Health - Occupational Stress Questionnaire    Feeling of Stress : Not at all  Social Connections: Not on file     Family History:  The patient's family history includes Cancer in his mother; Cervical cancer in his mother; Dementia in his father; Heart disease in his sister; Heart failure in his father.  ROS:   12-point review of systems is negative unless otherwise noted in the HPI.   EKGs/Labs/Other Studies Reviewed:    Studies reviewed were summarized above. The additional studies were reviewed today:  Limited echo 08/15/2022: 1. Left ventricular ejection fraction, by estimation, is 50 to 55%. The  left ventricle has low normal function. The left ventricle has no regional  wall motion abnormalities. Left ventricular diastolic parameters are   consistent with Grade I diastolic  dysfunction (impaired relaxation). The average left ventricular global  longitudinal strain is -15.3 %.   2. Right ventricular systolic function is normal. The right ventricular  size is normal. Tricuspid regurgitation signal is inadequate for assessing  PA pressure.   3. The mitral valve is normal in structure. No evidence of mitral valve  regurgitation. No evidence of mitral stenosis.   4. The aortic valve is tricuspid. Aortic valve regurgitation is not  visualized. No aortic stenosis is present.   5. The inferior vena cava  is normal in size with greater than 50%  respiratory variability, suggesting right atrial pressure of 3 mmHg.   6. bradycardia noted, rate 44 bpm   Comparison(s): Previous LVEF was reported as 20-25%. __________  TEE 04/09/2022: 1. Left ventricular ejection fraction, by estimation, is 20 to 25%. The  left ventricle has severely decreased function. The left ventricle  demonstrates global hypokinesis.   2. Right ventricular systolic function is moderately reduced. The right  ventricular size is normal.   3. Left atrial size was mild to moderately dilated. No left atrial/left  atrial appendage thrombus was detected.   4. Right atrial size was moderately dilated.   5. The mitral valve is normal in structure. Mild to moderate mitral valve  regurgitation.   6. The aortic valve is tricuspid. Aortic valve regurgitation is not  visualized. __________   2D echo 04/06/2022: 1. Left ventricular ejection fraction, by estimation, is 25%. The left  ventricle has severely decreased function. The left ventricle demonstrates  global hypokinesis. There is mild left ventricular hypertrophy. Left  ventricular diastolic parameters are  indeterminate.   2. Right ventricular systolic function is moderately reduced. The right  ventricular size is normal. Mildly increased right ventricular wall  thickness. There is severely elevated pulmonary  artery systolic pressure.  The estimated right ventricular  systolic pressure is 64.0 mmHg.   3. Left atrial size was moderately dilated.   4. The mitral valve is normal in structure. Moderate to severe mitral  valve regurgitation. No evidence of mitral stenosis.   5. Tricuspid valve regurgitation is moderate.   6. The aortic valve is tricuspid. Aortic valve regurgitation is not  visualized. No aortic stenosis is present.   7. The inferior vena cava is dilated in size with <50% respiratory  variability, suggesting right atrial pressure of 15 mmHg. __________   2D echo 07/03/2019: 1. The left ventricle has moderate-severely reduced systolic function,  with an ejection fraction of 30-35%. The cavity size was mild to  moderately dilated. Findings are consistent with dilated cardiomyopathy.  Left ventricular diastolic parameters were  normal. Left ventricular diffuse hypokinesis.   2. The right ventricle has moderately reduced systolic function. The  cavity was moderately enlarged. There is no increase in right ventricular  wall thickness.   3. Left atrial size was mildly dilated.   4. Right atrial size was mildly dilated.   5. The mitral valve is grossly normal. Mild thickening of the mitral  valve leaflet. Mitral valve regurgitation is mild to moderate by color  flow Doppler.   6. The aortic valve is grossly normal.   7. Mild valvular pulmonic stenosis.   8. The aorta is normal in size and structure.   9. The interatrial septum was not assessed. __________   2D echo 07/08/2018: - Left ventricle: The cavity size was at the upper limits of    normal. Wall thickness was increased in a pattern of mild LVH.    Systolic function was mildly reduced. The estimated ejection    fraction was in the range of 45% to 50%. Diffuse hypokinesis.    Doppler parameters are consistent with abnormal left ventricular    relaxation (grade 1 diastolic dysfunction).  - Left atrium: The atrium was mildly  dilated.  - Right ventricle: The cavity size was mildly dilated. Systolic    function was normal.   Impressions:   - Compared with prior echo from 05/04/18, LVEF has significantly    improved. Mitral regurgitation has decreased. __________  TEE 05/07/2018: - Left ventricle: Systolic function was severely reduced. The    estimated ejection fraction was in the range of 20% to 25%.  - Aortic valve: There was trivial regurgitation.  - Aortic root: The aortic root was mildly dilated.  - Mitral valve: There was mild to moderate regurgitation.  - Left atrium: No evidence of thrombus in the atrial cavity or    appendage.  - Right ventricle: Systolic function was reduced.  - Right atrium: No evidence of thrombus in the atrial cavity or    appendage.  - Tricuspid valve: There was mild-moderate regurgitation.  - Pericardium, extracardiac: There was a small left pleural    effusion.   Impressions:   - Successful cardioversion.  ___________   [facility] 05/05/2018: 1.  Normal coronary arteries. 2.  Severely reduced LV systolic function by echo.  Left ventricular angiography was not performed. 3.  Right heart catheterization showed severely elevated filling pressures, moderate pulmonary hypertension and severely reduced cardiac output.  Cardiac output was 2.98 with a cardiac index of 1.47.   Recommendations: The patient has nonischemic cardiomyopathy likely tachycardia induced.  He continues to be significantly volume overloaded and I recommend continuing IV diuresis. Resume heparin drip 8 hours after sheath pull.  A DOAC can be started tomorrow.  I recommend TEE guided cardioversion before hospital discharge given degree of cardiomyopathy.  This can likely be done on Wednesday once volume overload improves.  I am going to start the patient on oral amiodarone to facilitate cardioversion. __________   2D echo 05/03/2018: - Left ventricle: The cavity size was normal. Systolic function was     severely reduced. The estimated ejection fraction was in the    range of 25% to 30%. Diffuse hypokinesis. Regional wall motion    abnormalities cannot be excluded. The study is not technically    sufficient to allow evaluation of LV diastolic function.  - Aortic root: The aortic root was mildly dilated  - Mitral valve: There was moderate regurgitation.  - Left atrium: The atrium was moderately dilated.  - Right ventricle: The cavity size was mildly to moderately    dilated. Wall thickness was normal. Systolic function was mildly    reduced.  - Right atrium: The atrium was mildly dilated.  - Tricuspid valve: There was moderate regurgitation.  - Pulmonary arteries: Systolic pressure was moderately elevated PA    peak pressure: 52 mm Hg (S).   Impressions:   - Rhythm is atrial fibrillation.   EKG:  EKG is ordered today.  The EKG ordered today demonstrates sinus bradycardia, 48 bpm, left axis deviation, LVH with diffuse T wave inversion involving the anterolateral and inferior leads, which have previously been noted.  Recent Labs: 04/06/2022: B Natriuretic Peptide 1,383.6 05/10/2022: ALT 15; Hemoglobin 14.1; Platelets 180; TSH 1.004 07/16/2022: BUN 19; Creatinine, Ser 1.09; Potassium 4.2; Sodium 137  Recent Lipid Panel    Component Value Date/Time   CHOL 201 (H) 08/04/2020 0950   TRIG 110 08/04/2020 0950   HDL 63 08/04/2020 0950   CHOLHDL 3.2 08/04/2020 0950   CHOLHDL 3.3 05/04/2018 0138   VLDL 13 05/04/2018 0138   LDLCALC 119 (H) 08/04/2020 0950    PHYSICAL EXAM:    VS:  BP (!) 158/100 (BP Location: Left Arm, Patient Position: Sitting, Cuff Size: Normal)   Pulse (!) 48   Ht 5\' 11"  (1.803 m)   Wt 176 lb 6 oz (80 kg)   SpO2 96%   BMI 24.60 kg/m  BMI: Body mass index is 24.6 kg/m.  Physical Exam Vitals reviewed.  Constitutional:      Appearance: He is well-developed.  HENT:     Head: Normocephalic and atraumatic.  Eyes:     General:        Right eye: No discharge.         Left eye: No discharge.  Neck:     Vascular: No JVD.  Cardiovascular:     Rate and Rhythm: Normal rate and regular rhythm.     Pulses:          Posterior tibial pulses are 2+ on the right side and 2+ on the left side.     Heart sounds: Normal heart sounds, S1 normal and S2 normal. Heart sounds not distant. No midsystolic click and no opening snap. No murmur heard.    No friction rub.  Pulmonary:     Effort: Pulmonary effort is normal. No respiratory distress.     Breath sounds: Normal breath sounds. No decreased breath sounds, wheezing or rales.  Chest:     Chest wall: No tenderness.  Abdominal:     General: There is no distension.  Musculoskeletal:     Cervical back: Normal range of motion.     Right lower leg: No edema.     Left lower leg: No edema.  Skin:    General: Skin is warm and dry.     Nails: There is no clubbing.  Neurological:     Mental Status: He is alert and oriented to person, place, and time.  Psychiatric:        Speech: Speech normal.        Behavior: Behavior normal.        Thought Content: Thought content normal.        Judgment: Judgment normal.     Wt Readings from Last 3 Encounters:  09/21/22 176 lb 6 oz (80 kg)  07/16/22 178 lb 4 oz (80.9 kg)  06/15/22 175 lb 12.8 oz (79.7 kg)     ASSESSMENT & PLAN:   HFrEF secondary to NICM: He is euvolemic and well compensated with NYHA class II symptoms.  Decrease Toprol-XL to 50 mg daily, which was previously recommended, due to underlying bradycardia.  Otherwise, he remains on losartan and furosemide with recent renal function and electrolytes noted to be stable.  Escalation of GDMT has been limited by laboratory follow-up and with our ability to get in contact with the patient/family for recommendations based on lab work.  We will defer escalation of GDMT at this time given this, and in the context of subsequent normalization of LV systolic function.  CHF education.  Persistent Afib/flutter: Maintaining  sinus rhythm with a bradycardic rate.  He is asymptomatic.  Again, recommend reduce Toprol-XL to 50 mg daily.  CHA2DS2-VASc at 3.  He remains on apixaban 5 mg twice daily (he does not meet reduced dosing criteria).  No falls or symptoms concerning for bleeding.  Recent lab work stable as outlined above.  Normal coronary arteries: No symptoms concerning for angina.  LHC in 2019 showed normal coronary arteries.  Repeat limited echo showed normalization of LV systolic function.  He remains on apixaban in place of aspirin to minimize bleeding risk.  Aggressive risk factor modification is recommended.  Valvular heart disease: Updated echo showed no significant valvular disease.  HTN: Blood pressure is mildly elevated in the office today, though this may be due to marked bradycardia.  Medical therapy as outlined above.  Medication nonadherence: He reports adherence to medications.  The importance of follow-up and medications have been discussed in detail.    Disposition: F/u with Dr. Kirke Corin or an APP in 6 months.   Medication Adjustments/Labs and Tests Ordered: Current medicines are reviewed at length with the patient today.  Concerns regarding medicines are outlined above. Medication changes, Labs and Tests ordered today are summarized above and listed in the Patient Instructions accessible in Encounters.   Signed, Eula Listen, PA-C 09/21/2022 9:24 AM     [facility] [place] [address] [place], [place] [address] [phone]

## 2022-09-21 ENCOUNTER — Encounter: Payer: Self-pay | Admitting: Physician Assistant

## 2022-09-21 ENCOUNTER — Ambulatory Visit: Payer: Medicare Other | Attending: Physician Assistant | Admitting: Physician Assistant

## 2022-09-21 VITALS — BP 158/100 | HR 48 | Ht 71.0 in | Wt 176.4 lb

## 2022-09-21 DIAGNOSIS — I4819 Other persistent atrial fibrillation: Secondary | ICD-10-CM | POA: Diagnosis not present

## 2022-09-21 DIAGNOSIS — I5022 Chronic systolic (congestive) heart failure: Secondary | ICD-10-CM | POA: Diagnosis not present

## 2022-09-21 DIAGNOSIS — I428 Other cardiomyopathies: Secondary | ICD-10-CM

## 2022-09-21 DIAGNOSIS — I10 Essential (primary) hypertension: Secondary | ICD-10-CM

## 2022-09-21 DIAGNOSIS — Z91148 Patient's other noncompliance with medication regimen for other reason: Secondary | ICD-10-CM | POA: Diagnosis not present

## 2022-09-21 NOTE — Patient Instructions (Addendum)
Medication Instructions:  Your physician has recommended you make the following change in your medication:   DECREASE Metoprolol to 50 mg once daily   *If you need a refill on your cardiac medications before your next appointment, please call your pharmacy*   Lab Work: None  If you have labs (blood work) drawn today and your tests are completely normal, you will receive your results only by: [facility] (if you have MyChart) OR A paper copy in the mail If you have any lab test that is abnormal or we need to change your treatment, we will call you to review the results.   Testing/Procedures: None   Follow-Up: At [facility], you and your health needs are our priority.  As part of our continuing mission to provide you with exceptional heart care, we have created designated Provider Care Teams.  These Care Teams include your primary Cardiologist (physician) and Advanced Practice Providers (APPs -  Physician Assistants and Nurse Practitioners) who all work together to provide you with the care you need, when you need it.  We recommend signing up for the patient portal called "MyChart".  Sign up information is provided on this After Visit Summary.  MyChart is used to connect with patients for Virtual Visits (Telemedicine).  Patients are able to view lab/test results, encounter notes, upcoming appointments, etc.  Non-urgent messages can be sent to your provider as well.   To learn more about what you can do with MyChart, go to [email or web address].    Your next appointment:   6 month(s)  The format for your next appointment:   In Person  Provider:   Kathlyn Sacramento, MD or Christell Faith, PA-C        Important Information About Sugar

## 2023-07-13 ENCOUNTER — Inpatient Hospital Stay
Admission: EM | Admit: 2023-07-13 | Discharge: 2023-07-15 | DRG: 291 | Disposition: A | Discharge status: Home / Self Care | Payer: Medicare HMO | Attending: Student in an Organized Health Care Education/Training Program | Admitting: Student in an Organized Health Care Education/Training Program

## 2023-07-13 ENCOUNTER — Encounter: Payer: Self-pay | Admitting: Intensive Care

## 2023-07-13 ENCOUNTER — Observation Stay (HOSPITAL_COMMUNITY)
Admit: 2023-07-13 | Discharge: 2023-07-13 | Disposition: A | Discharge status: Home / Self Care | Payer: Medicare HMO | Attending: Physician Assistant | Admitting: Physician Assistant

## 2023-07-13 ENCOUNTER — Emergency Department: Payer: Medicare HMO

## 2023-07-13 ENCOUNTER — Other Ambulatory Visit: Payer: Self-pay

## 2023-07-13 DIAGNOSIS — U071 COVID-19: Secondary | ICD-10-CM

## 2023-07-13 DIAGNOSIS — I480 Paroxysmal atrial fibrillation: Secondary | ICD-10-CM

## 2023-07-13 DIAGNOSIS — I4819 Other persistent atrial fibrillation: Secondary | ICD-10-CM | POA: Diagnosis present

## 2023-07-13 DIAGNOSIS — I509 Heart failure, unspecified: Secondary | ICD-10-CM | POA: Diagnosis not present

## 2023-07-13 DIAGNOSIS — Z87891 Personal history of nicotine dependence: Secondary | ICD-10-CM

## 2023-07-13 DIAGNOSIS — I4892 Unspecified atrial flutter: Secondary | ICD-10-CM | POA: Diagnosis not present

## 2023-07-13 DIAGNOSIS — Z820 Family history of epilepsy and other diseases of the nervous system: Secondary | ICD-10-CM

## 2023-07-13 DIAGNOSIS — I10 Essential (primary) hypertension: Secondary | ICD-10-CM

## 2023-07-13 DIAGNOSIS — I214 Non-ST elevation (NSTEMI) myocardial infarction: Secondary | ICD-10-CM | POA: Diagnosis present

## 2023-07-13 DIAGNOSIS — I110 Hypertensive heart disease with heart failure: Principal | ICD-10-CM | POA: Diagnosis present

## 2023-07-13 DIAGNOSIS — I428 Other cardiomyopathies: Secondary | ICD-10-CM

## 2023-07-13 DIAGNOSIS — Z7901 Long term (current) use of anticoagulants: Secondary | ICD-10-CM

## 2023-07-13 DIAGNOSIS — I5043 Acute on chronic combined systolic (congestive) and diastolic (congestive) heart failure: Secondary | ICD-10-CM | POA: Diagnosis present

## 2023-07-13 DIAGNOSIS — I2489 Other forms of acute ischemic heart disease: Secondary | ICD-10-CM | POA: Diagnosis not present

## 2023-07-13 DIAGNOSIS — Z8049 Family history of malignant neoplasm of other genital organs: Secondary | ICD-10-CM

## 2023-07-13 DIAGNOSIS — Z8249 Family history of ischemic heart disease and other diseases of the circulatory system: Secondary | ICD-10-CM

## 2023-07-13 DIAGNOSIS — I5031 Acute diastolic (congestive) heart failure: Secondary | ICD-10-CM | POA: Diagnosis not present

## 2023-07-13 DIAGNOSIS — Z800 Family history of malignant neoplasm of digestive organs: Secondary | ICD-10-CM | POA: Diagnosis not present

## 2023-07-13 DIAGNOSIS — I081 Rheumatic disorders of both mitral and tricuspid valves: Secondary | ICD-10-CM | POA: Diagnosis present

## 2023-07-13 DIAGNOSIS — I5023 Acute on chronic systolic (congestive) heart failure: Secondary | ICD-10-CM | POA: Diagnosis not present

## 2023-07-13 DIAGNOSIS — I4891 Unspecified atrial fibrillation: Secondary | ICD-10-CM | POA: Diagnosis present

## 2023-07-13 DIAGNOSIS — R000 Tachycardia, unspecified: Secondary | ICD-10-CM | POA: Diagnosis present

## 2023-07-13 DIAGNOSIS — R7989 Other specified abnormal findings of blood chemistry: Secondary | ICD-10-CM | POA: Diagnosis not present

## 2023-07-13 DIAGNOSIS — Z79899 Other long term (current) drug therapy: Secondary | ICD-10-CM | POA: Diagnosis not present

## 2023-07-13 DIAGNOSIS — R0989 Other specified symptoms and signs involving the circulatory and respiratory systems: Secondary | ICD-10-CM | POA: Diagnosis not present

## 2023-07-13 DIAGNOSIS — Z7189 Other specified counseling: Secondary | ICD-10-CM

## 2023-07-13 DIAGNOSIS — I517 Cardiomegaly: Secondary | ICD-10-CM | POA: Diagnosis not present

## 2023-07-13 DIAGNOSIS — Z888 Allergy status to other drugs, medicaments and biological substances status: Secondary | ICD-10-CM | POA: Diagnosis not present

## 2023-07-13 DIAGNOSIS — R0602 Shortness of breath: Secondary | ICD-10-CM | POA: Diagnosis not present

## 2023-07-13 DIAGNOSIS — R1030 Lower abdominal pain, unspecified: Secondary | ICD-10-CM | POA: Diagnosis not present

## 2023-07-13 LAB — ECHOCARDIOGRAM COMPLETE
AR max vel: 2.06 cm2
AV Area VTI: 2.69 cm2
AV Area mean vel: 2.18 cm2
AV Mean grad: 1 mmHg
AV Peak grad: 2.3 mmHg
Ao pk vel: 0.76 m/s
Area-P 1/2: 6.22 cm2
Calc EF: 25 %
Height: 71 in
MV VTI: 1.85 cm2
S' Lateral: 4.3 cm
Single Plane A2C EF: 21.2 %
Single Plane A4C EF: 29.4 %
Weight: 2752 oz

## 2023-07-13 LAB — CBC
HCT: 39.5 % (ref 39.0–52.0)
Hemoglobin: 14.8 g/dL (ref 13.0–17.0)
MCH: 32.5 pg (ref 26.0–34.0)
MCHC: 37.5 g/dL — ABNORMAL HIGH (ref 30.0–36.0)
MCV: 86.8 fL (ref 80.0–100.0)
Platelets: 176 10*3/uL (ref 150–400)
RBC: 4.55 MIL/uL (ref 4.22–5.81)
RDW: 12.3 % (ref 11.5–15.5)
WBC: 7.4 10*3/uL (ref 4.0–10.5)
nRBC: 0 % (ref 0.0–0.2)

## 2023-07-13 LAB — BRAIN NATRIURETIC PEPTIDE: B Natriuretic Peptide: 1668.8 pg/mL — ABNORMAL HIGH (ref 0.0–100.0)

## 2023-07-13 LAB — COMPREHENSIVE METABOLIC PANEL
ALT: 22 U/L (ref 0–44)
AST: 48 U/L — ABNORMAL HIGH (ref 15–41)
Albumin: 4.5 g/dL (ref 3.5–5.0)
Alkaline Phosphatase: 56 U/L (ref 38–126)
Anion gap: 10 (ref 5–15)
BUN: 12 mg/dL (ref 8–23)
CO2: 23 mmol/L (ref 22–32)
Calcium: 9.1 mg/dL (ref 8.9–10.3)
Chloride: 104 mmol/L (ref 98–111)
Creatinine, Ser: 0.9 mg/dL (ref 0.61–1.24)
GFR, Estimated: >60 mL/min (ref >60)
Glucose, Bld: 110 mg/dL — ABNORMAL HIGH (ref 70–99)
Potassium: 4 mmol/L (ref 3.5–5.1)
Sodium: 137 mmol/L (ref 135–145)
Total Bilirubin: 2.2 mg/dL — ABNORMAL HIGH (ref 0.3–1.2)
Total Protein: 8.5 g/dL — ABNORMAL HIGH (ref 6.5–8.1)

## 2023-07-13 LAB — TSH: TSH: 2.199 u[IU]/mL (ref 0.350–4.500)

## 2023-07-13 LAB — TROPONIN I (HIGH SENSITIVITY)
Troponin I (High Sensitivity): 21 ng/L — ABNORMAL HIGH (ref <18)
Troponin I (High Sensitivity): 20 ng/L — ABNORMAL HIGH (ref <18)

## 2023-07-13 LAB — SARS CORONAVIRUS 2 BY RT PCR: SARS Coronavirus 2 by RT PCR: POSITIVE — AB

## 2023-07-13 MED ORDER — FUROSEMIDE 40 MG PO TABS
40.0000 mg | ORAL_TABLET | Freq: Two times a day (BID) | ORAL | Status: DC
  Administered 2023-07-13 – 2023-07-15 (×4): 40 mg via ORAL
  Filled 2023-07-13 (×4): qty 1

## 2023-07-13 MED ORDER — LOSARTAN POTASSIUM 25 MG PO TABS
25.0000 mg | ORAL_TABLET | Freq: Every day | ORAL | Status: DC
  Administered 2023-07-13 – 2023-07-15 (×3): 25 mg via ORAL
  Filled 2023-07-13 (×3): qty 1

## 2023-07-13 MED ORDER — POTASSIUM CHLORIDE CRYS ER 20 MEQ PO TBCR
20.0000 meq | EXTENDED_RELEASE_TABLET | Freq: Every day | ORAL | Status: DC
  Administered 2023-07-13 – 2023-07-15 (×3): 20 meq via ORAL
  Filled 2023-07-13 (×3): qty 1

## 2023-07-13 MED ORDER — METOPROLOL TARTRATE 50 MG PO TABS
50.0000 mg | ORAL_TABLET | Freq: Four times a day (QID) | ORAL | Status: DC
  Administered 2023-07-13 – 2023-07-14 (×6): 50 mg via ORAL
  Filled 2023-07-13 (×7): qty 1

## 2023-07-13 MED ORDER — METOPROLOL TARTRATE 50 MG PO TABS: 100.0000 mg | ORAL_TABLET | Freq: Two times a day (BID) | ORAL | Status: DC

## 2023-07-13 MED ORDER — DILTIAZEM HCL 25 MG/5ML IV SOLN
15.0000 mg | Freq: Once | INTRAVENOUS | Status: AC
  Administered 2023-07-13: 15 mg via INTRAVENOUS
  Filled 2023-07-13: qty 5

## 2023-07-13 MED ORDER — APIXABAN 5 MG PO TABS
5.0000 mg | ORAL_TABLET | Freq: Two times a day (BID) | ORAL | Status: DC
  Administered 2023-07-13 – 2023-07-15 (×5): 5 mg via ORAL
  Filled 2023-07-13 (×5): qty 1

## 2023-07-13 MED ORDER — DILTIAZEM HCL-DEXTROSE 125-5 MG/125ML-% IV SOLN (PREMIX)
5.0000 mg/h | INTRAVENOUS | Status: DC
  Administered 2023-07-13: 5 mg/h via INTRAVENOUS
  Filled 2023-07-13: qty 125

## 2023-07-13 MED ORDER — FUROSEMIDE 10 MG/ML IJ SOLN
40.0000 mg | Freq: Once | INTRAMUSCULAR | Status: AC
  Administered 2023-07-13: 40 mg via INTRAVENOUS
  Filled 2023-07-13: qty 4

## 2023-07-13 MED ORDER — METOPROLOL TARTRATE 50 MG PO TABS: 50.0000 mg | ORAL_TABLET | Freq: Two times a day (BID) | ORAL | Status: DC

## 2023-07-13 MED ORDER — METOPROLOL TARTRATE 5 MG/5ML IV SOLN: 5.0000 mg | INTRAVENOUS | Status: DC | PRN

## 2023-07-13 NOTE — ED Notes (Signed)
Pt given menu, denies other needs at this time

## 2023-07-13 NOTE — H&P (Signed)
History and Physical    Donald Molina ZOX:[id number] DOB: 17-Feb-1951 DOA: 07/13/2023  PCP: Pcp, No (Confirm with patient/family/NH records and if not entered, this has to be entered at [facility] point of entry) Patient coming from: Home   I have personally briefly reviewed patient's old medical records in [facility]  Chief Complaint: Shortness of breath  HPI: Donald Molina is a 72 y.o. male with medical history significant of PAF status post cardioversion July 2023, on Eliquis, chronic combined HFrEF and HFpEF with recovered LVEF, HTN, NICM, presented with worsening of increasing shortness of breath.  Symptoms started 2 days ago, with new onset of dyspnea, at first was associated with shortness of breath on exertion but last 2 nights patient unable to sleep on flat bed and this morning, even feels shortness of breath at rest.  Denies any cough, no leg swelling, no chest pains no palpitations.  ED Course: Heart rate in the 130-150s, blood pressure significantly elevated 160/110, no hypoxia.  Chest x-ray showed cardiomegaly and pulmonary congestion.  K4.0, creatinine 0.9, hemoglobin 14.8 WBC 7.4.  Patient was started on Cardizem and bolus plus Cardizem drip.  1 dose of IV Lasix given in the ED  Review of Systems: As per HPI otherwise 14 point review of systems negative.    Past Medical History:  Diagnosis Date   CHF (congestive heart failure) (HCC)    HFrEF (heart failure with reduced ejection fraction) (HCC)    a. 04/2018 Echo: EF 25-30%; b. 06/2018 Echo: EF 45-50%. diff HK, Gr1 DD. Nl RV fxn; c. 06/2019 Echo: EF 30-35%, diff HK, mod reduced RV fxn, mild BAE, mild-mod MR, mild PS.   Hypertension    NICM (nonischemic cardiomyopathy) (HCC)    a. 04/2018 Echo: EF 25-30%; b. 06/2018 Echo: EF 45-50%; c. 06/2019 Echo: EF 30-35%.   Persistent atrial fibrillation (HCC)    a. Dx 04/2018 s/p DCCV and amio loading. CHA2DS2VASc 3-->eliquis.    Past Surgical History:  Procedure Laterality Date    CARDIAC CATHETERIZATION     CARDIOVERSION N/A 04/09/2022   Procedure: CARDIOVERSION;  Surgeon: Debbe Odea, MD;  Location: [facility] ORS;  Service: Cardiovascular;  Laterality: N/A;   CARDIOVERSION N/A 05/17/2022   Procedure: CARDIOVERSION;  Surgeon: Antonieta Iba, MD;  Location: [facility] ORS;  Service: Cardiovascular;  Laterality: N/A;   RIGHT/LEFT HEART CATH AND CORONARY ANGIOGRAPHY N/A 05/05/2018   Procedure: RIGHT/LEFT HEART CATH AND CORONARY ANGIOGRAPHY;  Surgeon: Iran Ouch, MD;  Location: [facility] INVASIVE CV LAB;  Service: Cardiovascular;  Laterality: N/A;   TEE WITHOUT CARDIOVERSION N/A 05/07/2018   Procedure: TRANSESOPHAGEAL ECHOCARDIOGRAM (TEE);  Surgeon: Yvonne Kendall, MD;  Location: [facility] ORS;  Service: Cardiovascular;  Laterality: N/A;   TEE WITHOUT CARDIOVERSION N/A 04/09/2022   Procedure: TRANSESOPHAGEAL ECHOCARDIOGRAM (TEE);  Surgeon: Debbe Odea, MD;  Location: [facility] ORS;  Service: Cardiovascular;  Laterality: N/A;     reports that he quit smoking about 25 years ago. His smoking use included cigarettes. He started smoking about 35 years ago. He quit smokeless tobacco use about 25 years ago.  His smokeless tobacco use included chew. He reports current alcohol use of about 12.0 standard drinks of alcohol per week. He reports that he does not use drugs.  Allergies  Allergen Reactions   Entresto [Sacubitril-Valsartan] Other (See Comments)    hypotension    Family History  Problem Relation Age of Onset   Cancer Mother        pancreatic   Cervical cancer Mother  Dementia Father    Heart failure Father    Heart disease Sister      Prior to Admission medications   Medication Sig Start Date End Date Taking? Authorizing Provider  apixaban (ELIQUIS) 5 MG TABS tablet Take 1 tablet (5 mg total) by mouth 2 (two) times daily. 06/15/22 12/12/22  Sondra Barges, PA-C  furosemide (LASIX) 40 MG tablet Take 1 tablet (40 mg total) by mouth 2 (two) times daily. 06/15/22   Dunn,  Raymon Mutton, PA-C  losartan (COZAAR) 25 MG tablet Take 1 tablet (25 mg total) by mouth daily. 06/15/22 09/21/22  Sondra Barges, PA-C  metoprolol succinate (TOPROL-XL) 100 MG 24 hr tablet Take 0.5 tablets (50 mg total) by mouth daily. Take with or immediately following a meal. 08/16/22   Dunn, Raymon Mutton, PA-C  potassium chloride SA (KLOR-CON M) 20 MEQ tablet Take 1 tablet (20 mEq total) by mouth daily. 06/15/22   Sondra Barges, PA-C    Physical Exam: Vitals:   07/13/23 0925 07/13/23 0930 07/13/23 1010 07/13/23 1015  BP: (!) 134/90 (!) 125/109 (!) 136/119 (!) 140/107  Pulse:  (!) 120    Resp: (!) 27 (!) 31 (!) 28 (!) 34  Temp:      TempSrc:      SpO2: 100% 98% 98% 99%  Weight:      Height:        Constitutional: NAD, calm, comfortable Vitals:   07/13/23 0925 07/13/23 0930 07/13/23 1010 07/13/23 1015  BP: (!) 134/90 (!) 125/109 (!) 136/119 (!) 140/107  Pulse:  (!) 120    Resp: (!) 27 (!) 31 (!) 28 (!) 34  Temp:      TempSrc:      SpO2: 100% 98% 98% 99%  Weight:      Height:       Eyes: PERRL, lids and conjunctivae normal ENMT: Mucous membranes are moist. Posterior pharynx clear of any exudate or lesions.Normal dentition.  Neck: normal, supple, no masses, no thyromegaly Respiratory: clear to auscultation bilaterally, no wheezing, fine crackles on the bilateral lower fields. Normal respiratory effort. No accessory muscle use.  Cardiovascular: Irregular heartbeat, tachycardia. No extremity edema. 2+ pedal pulses. No carotid bruits.  Abdomen: no tenderness, no masses palpated. No hepatosplenomegaly. Bowel sounds positive.  Musculoskeletal: no clubbing / cyanosis. No joint deformity upper and lower extremities. Good ROM, no contractures. Normal muscle tone.  Skin: no rashes, lesions, ulcers. No induration Neurologic: CN 2-12 grossly intact. Sensation intact, DTR normal. Strength 5/5 in all 4.  Psychiatric: Normal judgment and insight. Alert and oriented x 3. Normal mood.     Labs on Admission:  I have personally reviewed following labs and imaging studies  CBC: Recent Labs  Lab 07/13/23 0821  WBC 7.4  HGB 14.8  HCT 39.5  MCV 86.8  PLT 176   Basic Metabolic Panel: Recent Labs  Lab 07/13/23 0821  NA 137  K 4.0  CL 104  CO2 23  GLUCOSE 110*  BUN 12  CREATININE 0.90  CALCIUM 9.1   GFR: Estimated Creatinine Clearance: 79 mL/min (by C-G formula based on SCr of 0.9 mg/dL). Liver Function Tests: Recent Labs  Lab 07/13/23 0821  AST 48*  ALT 22  ALKPHOS 56  BILITOT 2.2*  PROT 8.5*  ALBUMIN 4.5   No results for input(s): "LIPASE", "AMYLASE" in the last 168 hours. No results for input(s): "AMMONIA" in the last 168 hours. Coagulation Profile: No results for input(s): "INR", "PROTIME" in the last 168 hours.  Cardiac Enzymes: No results for input(s): "CKTOTAL", "CKMB", "CKMBINDEX", "TROPONINI" in the last 168 hours. BNP (last 3 results) No results for input(s): "PROBNP" in the last 8760 hours. HbA1C: No results for input(s): "HGBA1C" in the last 72 hours. CBG: No results for input(s): "GLUCAP" in the last 168 hours. Lipid Profile: No results for input(s): "CHOL", "HDL", "LDLCALC", "TRIG", "CHOLHDL", "LDLDIRECT" in the last 72 hours. Thyroid Function Tests: No results for input(s): "TSH", "T4TOTAL", "FREET4", "T3FREE", "THYROIDAB" in the last 72 hours. Anemia Panel: No results for input(s): "VITAMINB12", "FOLATE", "FERRITIN", "TIBC", "IRON", "RETICCTPCT" in the last 72 hours. Urine analysis: No results found for: "COLORURINE", "APPEARANCEUR", "LABSPEC", "PHURINE", "GLUCOSEU", "HGBUR", "BILIRUBINUR", "KETONESUR", "PROTEINUR", "UROBILINOGEN", "NITRITE", "LEUKOCYTESUR"  Radiological Exams on Admission: DG Chest Port 1 View  Result Date: 07/13/2023 CLINICAL DATA:  Shortness of breath for several days. Worse while ambulating and lying flat. Lower abdominal pain. History of CHF. EXAM: PORTABLE CHEST 1 VIEW COMPARISON:  04/06/2022 FINDINGS: Cardiac enlargement, stable.  No signs of pleural effusion. Increased pulmonary vascular congestion. No airspace consolidation. The visualized osseous structures are unremarkable. IMPRESSION: Cardiac enlargement and increased pulmonary vascular congestion. Electronically Signed   By: Signa Kell M.D.   On: 07/13/2023 09:23    EKG: Independently reviewed.  A flutter with 2-1 transduction  Assessment/Plan Principal Problem:   A-fib (HCC) Active Problems:   Acute on chronic combined systolic and diastolic CHF (congestive heart failure) (HCC)   Atrial fibrillation with rapid ventricular response (HCC)  (please populate well all problems here in Problem List. (For example, if patient is on BP meds at home and you resume or decide to hold them, it is a problem that needs to be her. Same for CAD, COPD, HLD and so on)  A flutter with RVR -Expect this will be difficult to control rhythm, consult cardiology text message cardiology. -Continue Cardizem drip and titrate up, admitted to PCU for rate control and close monitoring -Increased metoprolol from 50 to 100 mg twice daily -Continue Eliquis -TSH  Acute on chronic combined HFrEF and HFpEF decompensation -Secondary to poorly controlled A-fib, management as above -Received 1 dose of IV Lasix in the ED and breathing symptoms significantly improved.  Plan to resume p.o. Lasix this evening and recheck chest x-ray tomorrow -Echocardiogram  HTN, poorly controlled -Increase metoprolol, continue losartan   DVT prophylaxis: Eliquis Code Status: Full code Family Communication: None at bedside Disposition Plan: Expect less than 2 midnight hospital stay Consults called: Cardiology Dr. Mayford Knife  Admission status: PCU   Emeline General MD [facility] Pager [phone]  07/13/2023, 10:17 AM

## 2023-07-13 NOTE — ED Notes (Signed)
Pt ordered himself lunch, denies other needs at this time

## 2023-07-13 NOTE — ED Notes (Addendum)
Pt resting in bed, pt denies any needs at this time. Family at bedside

## 2023-07-13 NOTE — Consult Note (Cosign Needed Addendum)
Cardiology Consultation:   Patient ID: Donald Molina; [id number]; 1951-02-08   Admit date: 07/13/2023 Date of Consult: 07/13/2023  Primary Care Provider: Pcp, No Primary Cardiologist: Kirke Corin Primary Electrophysiologist:  None   Patient Profile:   Donald Molina is a 72 y.o. male with a hx of normal coronary arteries by LHC in 04/2018, HFrEF secondary to NICM, persistent A-fib status post DCCV in 04/2022, HTN, and medication noncompliance leading to recurrent hospital admissions who is being seen today for the evaluation of Afib with RVR at the request of Dr. Chipper Herb.  History of Present Illness:   Donald Molina was admitted to the hospital in 04/2018 with A-fib and volume overload.  Echo demonstrated an EF of 25 to 30%.  Cardiac cath showed normal coronary arteries.  Following diuresis and amiodarone loading, he underwent successful DCCV.  He had recurrent A-fib following discharge in the setting of medication noncompliance with subsequent successful cardioversion thereafter following initiation of medications.  Follow-up echo in 06/2018 showed an improved LVEF of 45 to 50%.  In 06/2019, he was readmitted with dyspnea recurrent A-fib in the setting of medication noncompliance.  EF was again found to be reduced at 30 to 35% with diffuse hypokinesis.  He was diuresed and placed back on beta-blocker and apixaban with plan for rhythm management in the outpatient setting.  He was seen in the office in 07/2020 and was noted to be in sinus rhythm.  He had been lost to follow-up since then until he was admitted to the hospital in May of 2023 with volume overload and recurrent A-fib in the context of medication noncompliance.  He reported having not filled his medications for at least a year.  Echo demonstrated an EF of 25%, global hypokinesis with LVH, indeterminate LV diastolic function parameters, moderately reduced RV systolic function with normal ventricular cavity size and mildly increased ventricular wall  thickness, elevated PASP estimated at 64 mmHg, moderately dilated left atrium moderate to severe mitral valve regurgitation, moderate tricuspid regurgitation, and an estimated right atrial pressure 15 mmHg.  He underwent diuresis and escalation of GDMT.  He subsequently underwent unusuccessful TEE guided DCCV with 2 shocks at 200 J.  Following this, it was recommended he be loaded with amiodarone with plans for repeat attempt of DCCV in the outpatient setting.  Escalation in management of GDMT was limited by patient follow-up.  He subsequently underwent successful DCCV in 04/2022.  Following restoration of sinus rhythm, he underwent echo on 08/15/2022 that demonstrated an improvement in his LV systolic function with an EF of 50 to 55%, no regional wall motion abnormalities, grade 1 diastolic dysfunction, normal RV systolic function and ventricular cavity size, no significant valvular abnormalities, and an estimated right atrial pressure of 3 mmHg.  In the setting of sinus bradycardia, Toprol has previously been reduced.  He was last seen in the office in 08/2022, and was maintaining sinus rhythm with a mildly bradycardic rate.  He presented to [facility] ED on 07/13/2023 with a 2-day history of increased shortness of breath with associated orthopnea.  Without chest pain, palpitations, dizziness, presyncope, or syncope.  No lower extremity swelling, abdominal distention, or early satiety.  He was found to be in A-fib with RVR with ventricular rates in the 120s to 140s bpm.  BP in the 160s to 140s mmHg systolic.  Oxygen saturation 99 to 100% on room air.  Afebrile.  Chest x-ray with increased pulmonary vascular congestion.  BNP 1668.  High-sensitivity troponin 21.  Potassium 4.0, BUN 12,  serum creatinine 0.9, AST 48, WBC 7.4, Hgb 14.8, PLT 176.In the ED, he received IV diltiazem 15 mg along with IV Lasix and was placed on a diltiazem drip.  Currently, he reports his dyspnea has resolved and his breathing is back to baseline.   He reports vigorous urine output with IV Lasix.  He does report having missed one dose of furosemide in the past week, though otherwise indicates he has been adherent to all medications, including twice daily dosed apixaban.  No falls, hematochezia, or melena.  He is unaware he is back in A-fib at this time.  Ventricular rates in the low 100s at rest trending to the 120s bpm with movement in exam room.    Past Medical History:  Diagnosis Date   CHF (congestive heart failure) (HCC)    HFrEF (heart failure with reduced ejection fraction) (HCC)    a. 04/2018 Echo: EF 25-30%; b. 06/2018 Echo: EF 45-50%. diff HK, Gr1 DD. Nl RV fxn; c. 06/2019 Echo: EF 30-35%, diff HK, mod reduced RV fxn, mild BAE, mild-mod MR, mild PS.   Hypertension    NICM (nonischemic cardiomyopathy) (HCC)    a. 04/2018 Echo: EF 25-30%; b. 06/2018 Echo: EF 45-50%; c. 06/2019 Echo: EF 30-35%.   Persistent atrial fibrillation (HCC)    a. Dx 04/2018 s/p DCCV and amio loading. CHA2DS2VASc 3-->eliquis.    Past Surgical History:  Procedure Laterality Date   CARDIAC CATHETERIZATION     CARDIOVERSION N/A 04/09/2022   Procedure: CARDIOVERSION;  Surgeon: Debbe Odea, MD;  Location: [facility] ORS;  Service: Cardiovascular;  Laterality: N/A;   CARDIOVERSION N/A 05/17/2022   Procedure: CARDIOVERSION;  Surgeon: Antonieta Iba, MD;  Location: [facility] ORS;  Service: Cardiovascular;  Laterality: N/A;   RIGHT/LEFT HEART CATH AND CORONARY ANGIOGRAPHY N/A 05/05/2018   Procedure: RIGHT/LEFT HEART CATH AND CORONARY ANGIOGRAPHY;  Surgeon: Iran Ouch, MD;  Location: [facility] INVASIVE CV LAB;  Service: Cardiovascular;  Laterality: N/A;   TEE WITHOUT CARDIOVERSION N/A 05/07/2018   Procedure: TRANSESOPHAGEAL ECHOCARDIOGRAM (TEE);  Surgeon: Yvonne Kendall, MD;  Location: [facility] ORS;  Service: Cardiovascular;  Laterality: N/A;   TEE WITHOUT CARDIOVERSION N/A 04/09/2022   Procedure: TRANSESOPHAGEAL ECHOCARDIOGRAM (TEE);  Surgeon: Debbe Odea, MD;   Location: [facility] ORS;  Service: Cardiovascular;  Laterality: N/A;     Home Meds: Prior to Admission medications   Medication Sig Start Date End Date Taking? Authorizing Provider  apixaban (ELIQUIS) 5 MG TABS tablet Take 1 tablet (5 mg total) by mouth 2 (two) times daily. 06/15/22 07/13/23 Yes Jahquan Klugh, Raymon Mutton, PA-C  furosemide (LASIX) 40 MG tablet Take 1 tablet (40 mg total) by mouth 2 (two) times daily. 06/15/22  Yes Linzy Darling, Raymon Mutton, PA-C  losartan (COZAAR) 25 MG tablet Take 1 tablet (25 mg total) by mouth daily. 06/15/22 07/13/23 Yes Tyjanae Bartek, Raymon Mutton, PA-C  metoprolol succinate (TOPROL-XL) 100 MG 24 hr tablet Take 0.5 tablets (50 mg total) by mouth daily. Take with or immediately following a meal. 08/16/22  Yes Tagen Brethauer, Raymon Mutton, PA-C  potassium chloride SA (KLOR-CON M) 20 MEQ tablet Take 1 tablet (20 mEq total) by mouth daily. Patient not taking: Reported on 07/13/2023 06/15/22   Sondra Barges, PA-C    Inpatient Medications: Scheduled Meds:  apixaban  5 mg Oral BID   furosemide  40 mg Oral BID   losartan  25 mg Oral Daily   metoprolol tartrate  100 mg Oral BID   potassium chloride SA  20 mEq Oral Daily   Continuous  Infusions:  diltiazem (CARDIZEM) infusion 10 mg/hr (07/13/23 1016)   PRN Meds:   Allergies:   Allergies  Allergen Reactions   Entresto [Sacubitril-Valsartan] Other (See Comments)    hypotension    Social History:   Social History   Socioeconomic History   Marital status: Divorced    Spouse name: Not on file   Number of children: 2   Years of education: 12   Highest education level: 12th grade  Occupational History   Occupation: retired  Tobacco Use   Smoking status: Former    Current packs/day: 0.00    Types: Cigarettes    Start date: 05/28/1988    Quit date: 05/28/1998    Years since quitting: 25.1   Smokeless tobacco: Former    Types: Chew    Quit date: 05/28/1998  Vaping Use   Vaping status: Never Used  Substance and Sexual Activity   Alcohol use: Yes    Alcohol/week: 12.0  standard drinks of alcohol    Types: 12 Cans of beer per week   Drug use: Never   Sexual activity: Yes    Birth control/protection: Condom  Other Topics Concern   Not on file  Social History Narrative   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   Social Determinants of Health   Financial Resource Strain: Low Risk  (08/12/2019)   Received from [facility], [facility]   Overall Financial Resource Strain (CARDIA)    Difficulty of Paying Living Expenses: Not hard at all  Food Insecurity: No Food Insecurity (08/12/2019)   Received from [facility], [facility]   Hunger Vital Sign    Worried About Running Out of Food in the Last Year: Never true    Ran Out of Food in the Last Year: Never true  Transportation Needs: No Transportation Needs (08/12/2019)   Received from [facility], [facility]   [facility] - Transportation    Lack of Transportation (Medical): No    Lack of Transportation (Non-Medical): No  Physical Activity: Insufficiently Active (05/28/2018)   Exercise Vital Sign    Days of Exercise per Week: 7 days    Minutes of Exercise per Session: 20 min  Stress: No Stress Concern Present (05/28/2018)   [employer] [facility] [employer] - Occupational Stress Questionnaire    Feeling of Stress : Not at all  Social Connections: Not on file  Intimate Partner Violence: Not At Risk (05/28/2018)   Humiliation, Afraid, Rape, and Kick questionnaire    Fear of Current or Ex-Partner: No    Emotionally Abused: No    Physically Abused: No    Sexually Abused: No     Family History:   Family History  Problem Relation Age of Onset   Cancer Mother        pancreatic   Cervical cancer Mother    Dementia Father    Heart failure Father    Heart disease Sister     ROS:  Review of Systems  Constitutional:  Negative for chills, diaphoresis, fever, malaise/fatigue and weight loss.  HENT:  Negative for congestion.   Eyes:  Negative for discharge  and redness.  Respiratory:  Positive for shortness of breath. Negative for cough, sputum production and wheezing.   Cardiovascular:  Positive for orthopnea. Negative for chest pain, palpitations, claudication, leg swelling and PND.  Gastrointestinal:  Negative for abdominal pain, blood in stool, heartburn, melena, nausea and vomiting.  Musculoskeletal:  Negative for falls and  myalgias.  Skin:  Negative for rash.  Neurological:  Negative for dizziness, tingling, tremors, sensory change, speech change, focal weakness, loss of consciousness and weakness.  Endo/Heme/Allergies:  Does not bruise/bleed easily.  Psychiatric/Behavioral:  Negative for substance abuse. The patient is not nervous/anxious.   All other systems reviewed and are negative.     Physical Exam/Data:   Vitals:   07/13/23 0925 07/13/23 0930 07/13/23 1010 07/13/23 1015  BP: (!) 134/90 (!) 125/109 (!) 136/119 (!) 140/107  Pulse:  (!) 120    Resp: (!) 27 (!) 31 (!) 28 (!) 34  Temp:      TempSrc:      SpO2: 100% 98% 98% 99%  Weight:      Height:        Intake/Output Summary (Last 24 hours) at 07/13/2023 1056 Last data filed at 07/13/2023 1030 Gross per 24 hour  Intake 4.03 ml  Output 675 ml  Net -670.97 ml   Filed Weights   07/13/23 0813  Weight: 78 kg   Body mass index is 23.99 kg/m.   Physical Exam: General: Well developed, well nourished, in no acute distress. Head: Normocephalic, atraumatic, sclera non-icteric, no xanthomas, nares without discharge.  Neck: Negative for carotid bruits. JVD not elevated. Lungs: Clear bilaterally to auscultation without wheezes, rales, or rhonchi. Breathing is unlabored. Heart: Mildly tachycardic and irregularly irregular with S1 S2. No murmurs, rubs, or gallops appreciated. Abdomen: Soft, non-tender, non-distended with normoactive bowel sounds. No hepatomegaly. No rebound/guarding. No obvious abdominal masses. Msk:  Strength and tone appear normal for age. Extremities: No  clubbing or cyanosis. No edema. Distal pedal pulses are 2+ and equal bilaterally. Neuro: Alert and oriented X 3. No facial asymmetry. No focal deficit. Moves all extremities spontaneously. Psych:  Responds to questions appropriately with a normal affect.   EKG:  The EKG was personally reviewed and demonstrates: A-fib with RVR, 130 bpm, left axis deviation, poor R wave progression along the precordial leads, nonspecific ST-T changes, baseline artifact, and LVH Telemetry:  Telemetry was personally reviewed and demonstrates: A-fib with RVR with ventricular rates ranging from the low 100s to 120s bpm  Weights: Filed Weights   07/13/23 0813  Weight: 78 kg    Relevant CV Studies:  Limited echo 08/15/2022: 1. Left ventricular ejection fraction, by estimation, is 50 to 55%. The  left ventricle has low normal function. The left ventricle has no regional  wall motion abnormalities. Left ventricular diastolic parameters are  consistent with Grade I diastolic  dysfunction (impaired relaxation). The average left ventricular global  longitudinal strain is -15.3 %.   2. Right ventricular systolic function is normal. The right ventricular  size is normal. Tricuspid regurgitation signal is inadequate for assessing  PA pressure.   3. The mitral valve is normal in structure. No evidence of mitral valve  regurgitation. No evidence of mitral stenosis.   4. The aortic valve is tricuspid. Aortic valve regurgitation is not  visualized. No aortic stenosis is present.   5. The inferior vena cava is normal in size with greater than 50%  respiratory variability, suggesting right atrial pressure of 3 mmHg.   6. bradycardia noted, rate 44 bpm   Comparison(s): Previous LVEF was reported as 20-25%. __________   TEE 04/09/2022: 1. Left ventricular ejection fraction, by estimation, is 20 to 25%. The  left ventricle has severely decreased function. The left ventricle  demonstrates global hypokinesis.   2. Right  ventricular systolic function is moderately reduced. The right  ventricular size  is normal.   3. Left atrial size was mild to moderately dilated. No left atrial/left  atrial appendage thrombus was detected.   4. Right atrial size was moderately dilated.   5. The mitral valve is normal in structure. Mild to moderate mitral valve  regurgitation.   6. The aortic valve is tricuspid. Aortic valve regurgitation is not  visualized. __________   2D echo 04/06/2022: 1. Left ventricular ejection fraction, by estimation, is 25%. The left  ventricle has severely decreased function. The left ventricle demonstrates  global hypokinesis. There is mild left ventricular hypertrophy. Left  ventricular diastolic parameters are  indeterminate.   2. Right ventricular systolic function is moderately reduced. The right  ventricular size is normal. Mildly increased right ventricular wall  thickness. There is severely elevated pulmonary artery systolic pressure.  The estimated right ventricular  systolic pressure is 64.0 mmHg.   3. Left atrial size was moderately dilated.   4. The mitral valve is normal in structure. Moderate to severe mitral  valve regurgitation. No evidence of mitral stenosis.   5. Tricuspid valve regurgitation is moderate.   6. The aortic valve is tricuspid. Aortic valve regurgitation is not  visualized. No aortic stenosis is present.   7. The inferior vena cava is dilated in size with <50% respiratory  variability, suggesting right atrial pressure of 15 mmHg. __________   2D echo 07/03/2019: 1. The left ventricle has moderate-severely reduced systolic function,  with an ejection fraction of 30-35%. The cavity size was mild to  moderately dilated. Findings are consistent with dilated cardiomyopathy.  Left ventricular diastolic parameters were  normal. Left ventricular diffuse hypokinesis.   2. The right ventricle has moderately reduced systolic function. The  cavity was moderately  enlarged. There is no increase in right ventricular  wall thickness.   3. Left atrial size was mildly dilated.   4. Right atrial size was mildly dilated.   5. The mitral valve is grossly normal. Mild thickening of the mitral  valve leaflet. Mitral valve regurgitation is mild to moderate by color  flow Doppler.   6. The aortic valve is grossly normal.   7. Mild valvular pulmonic stenosis.   8. The aorta is normal in size and structure.   9. The interatrial septum was not assessed. __________   2D echo 07/08/2018: - Left ventricle: The cavity size was at the upper limits of    normal. Wall thickness was increased in a pattern of mild LVH.    Systolic function was mildly reduced. The estimated ejection    fraction was in the range of 45% to 50%. Diffuse hypokinesis.    Doppler parameters are consistent with abnormal left ventricular    relaxation (grade 1 diastolic dysfunction).  - Left atrium: The atrium was mildly dilated.  - Right ventricle: The cavity size was mildly dilated. Systolic    function was normal.   Impressions:   - Compared with prior echo from 05/04/18, LVEF has significantly    improved. Mitral regurgitation has decreased. __________   TEE 05/07/2018: - Left ventricle: Systolic function was severely reduced. The    estimated ejection fraction was in the range of 20% to 25%.  - Aortic valve: There was trivial regurgitation.  - Aortic root: The aortic root was mildly dilated.  - Mitral valve: There was mild to moderate regurgitation.  - Left atrium: No evidence of thrombus in the atrial cavity or    appendage.  - Right ventricle: Systolic function was reduced.  -  Right atrium: No evidence of thrombus in the atrial cavity or    appendage.  - Tricuspid valve: There was mild-moderate regurgitation.  - Pericardium, extracardiac: There was a small left pleural    effusion.   Impressions:   - Successful cardioversion.  ___________   [facility] 05/05/2018: 1.  Normal  coronary arteries. 2.  Severely reduced LV systolic function by echo.  Left ventricular angiography was not performed. 3.  Right heart catheterization showed severely elevated filling pressures, moderate pulmonary hypertension and severely reduced cardiac output.  Cardiac output was 2.98 with a cardiac index of 1.47.   Recommendations: The patient has nonischemic cardiomyopathy likely tachycardia induced.  He continues to be significantly volume overloaded and I recommend continuing IV diuresis. Resume heparin drip 8 hours after sheath pull.  A DOAC can be started tomorrow.  I recommend TEE guided cardioversion before hospital discharge given degree of cardiomyopathy.  This can likely be done on Wednesday once volume overload improves.  I am going to start the patient on oral amiodarone to facilitate cardioversion. __________   2D echo 05/03/2018: - Left ventricle: The cavity size was normal. Systolic function was    severely reduced. The estimated ejection fraction was in the    range of 25% to 30%. Diffuse hypokinesis. Regional wall motion    abnormalities cannot be excluded. The study is not technically    sufficient to allow evaluation of LV diastolic function.  - Aortic root: The aortic root was mildly dilated  - Mitral valve: There was moderate regurgitation.  - Left atrium: The atrium was moderately dilated.  - Right ventricle: The cavity size was mildly to moderately    dilated. Wall thickness was normal. Systolic function was mildly    reduced.  - Right atrium: The atrium was mildly dilated.  - Tricuspid valve: There was moderate regurgitation.  - Pulmonary arteries: Systolic pressure was moderately elevated PA    peak pressure: 52 mm Hg (S).   Impressions:   - Rhythm is atrial fibrillation.   Laboratory Data:  Chemistry Recent Labs  Lab 07/13/23 0821  NA 137  K 4.0  CL 104  CO2 23  GLUCOSE 110*  BUN 12  CREATININE 0.90  CALCIUM 9.1  GFRNONAA >60  ANIONGAP 10     Recent Labs  Lab 07/13/23 0821  PROT 8.5*  ALBUMIN 4.5  AST 48*  ALT 22  ALKPHOS 56  BILITOT 2.2*   Hematology Recent Labs  Lab 07/13/23 0821  WBC 7.4  RBC 4.55  HGB 14.8  HCT 39.5  MCV 86.8  MCH 32.5  MCHC 37.5*  RDW 12.3  PLT 176   Cardiac EnzymesNo results for input(s): "TROPONINI" in the last 168 hours. No results for input(s): "TROPIPOC" in the last 168 hours.  BNP Recent Labs  Lab 07/13/23 0821  BNP 1,668.8*    DDimer No results for input(s): "DDIMER" in the last 168 hours.  Radiology/Studies:  [facility] Chest Port 1 View  Result Date: 07/13/2023 IMPRESSION: Cardiac enlargement and increased pulmonary vascular congestion. Electronically Signed   By: Signa Kell M.D.   On: 07/13/2023 09:23    Assessment and Plan:   1. Persistent Afib with RVR: -He remains in A-fib with suboptimally controlled ventricular rates -For added ventricular rate control, transition Lopressor 100 mg twice daily to 50 mg 4 times daily -Remains on diltiazem drip, wean as able -CHA2DS2-VASc at least 3 (CHF, HTN, age x 1) -Remains on apixaban 5 mg twice daily and does not meet  reduced dosing criteria, reports adherence to anticoagulation and denies symptoms concerning for bleeding or falls -Should his ventricular rates remain difficult to control, would need to pursue cardioversion early next week, prior to discharge.  He reports adherence to apixaban, and denies missing any doses -TSH normal, potassium at goal, check magnesium  2.  HFimpEF secondary NICM: -Volume status improved with breathing back to baseline -Likely secondary to A-fib with RVR -Obtain echo once ventricular rate has improved -Metoprolol as outlined above with recommendation to consolidate back to Toprol-XL for ease of dosing prior to discharge -PTA losartan  -Escalate GDMT pending echo findings once ventricular rate has improved -Repeat chest x-ray for 8/18 pending, trend BNP at that time as well  3.  Elevated  high-sensitivity troponin: -Prior LHC in 2019 showed normal coronary arteries. -Initial high-sensitivity troponin mildly elevated at 21, await delta troponin -Likely supply/demand ischemia in the setting of A-fib with RVR and volume overload, await delta troponin -No indication for heparin drip at this time -Echo pending  4.  HTN: -Blood pressure improving, though remains suboptimally controlled -Metoprolol and losartan ordered as outlined above  5.  Medication noncompliance: -Reports adherence to all medications outside of missing 1 dose of furosemide in the past week -Importance of medication adherence discussed in detail      For questions or updates, please contact [facility] Please consult [email or web address] for contact info under Cardiology/STEMI.   Signed, Eula Listen, PA-C [facility] Pager: [phone] 07/13/2023, 10:56 AM

## 2023-07-13 NOTE — ED Triage Notes (Signed)
Patient c/o sob for a few days. Worsening with ambulating and laying flat. Reports lower abdominal pain.  History CHF

## 2023-07-13 NOTE — ED Notes (Signed)
Echo at bedside

## 2023-07-13 NOTE — ED Provider Notes (Signed)
[facility] Provider Note    Event Date/Time   First MD Initiated Contact with Patient 07/13/23 0818     (approximate)   History   Shortness of breath   HPI  Donald Molina is a 72 y.o. male with a history of heart failure, hypertension, atrial fibrillation who presents with shortness of breath primarily when lying flat.  He reports this started over the last 2 days.  It is better when he goes and sits on his porch.  He denies chest pain to me.  Reports compliance with his Eliquis and his metoprolol and his fluid pills     Physical Exam   Triage Vital Signs: ED Triage Vitals  Encounter Vitals Group     BP 07/13/23 0813 (!) 161/111     Systolic BP Percentile --      Diastolic BP Percentile --      Pulse Rate 07/13/23 0813 (!) 137     Resp 07/13/23 0813 (!) 33     Temp 07/13/23 0813 98.2 F (36.8 C)     Temp Source 07/13/23 0813 Oral     SpO2 07/13/23 0812 97 %     Weight 07/13/23 0813 78 kg (172 lb)     Height 07/13/23 0813 1.803 m (5\' 11" )     Head Circumference --      Peak Flow --      Pain Score 07/13/23 0813 8     Pain Loc --      Pain Education --      Exclude from Growth Chart --     Most recent vital signs: Vitals:   07/13/23 0925 07/13/23 0930  BP: (!) 134/90 (!) 125/109  Pulse:  (!) 120  Resp: (!) 27 (!) 31  Temp:    SpO2: 100% 98%     General: Awake, no distress.  CV:  Good peripheral perfusion.  Resp:  Mild tachypnea bibasilar Rales Abd:  No distention.  Other:  No significant lower extremity edema   ED Results / Procedures / Treatments   Labs (all labs ordered are listed, but only abnormal results are displayed) Labs Reviewed  CBC - Abnormal; Notable for the following components:      Result Value   MCHC 37.5 (*)    All other components within normal limits  COMPREHENSIVE METABOLIC PANEL - Abnormal; Notable for the following components:   Glucose, Bld 110 (*)    Total Protein 8.5 (*)    AST 48 (*)     Total Bilirubin 2.2 (*)    All other components within normal limits  BRAIN NATRIURETIC PEPTIDE - Abnormal; Notable for the following components:   B Natriuretic Peptide 1,668.8 (*)    All other components within normal limits  TROPONIN I (HIGH SENSITIVITY) - Abnormal; Notable for the following components:   Troponin I (High Sensitivity) 21 (*)    All other components within normal limits     EKG  ED ECG REPORT I, Jene Every, the attending physician, personally viewed and interpreted this ECG.  Date: 07/13/2023  Rhythm: Atrial fibrillation QRS Axis: normal Intervals: Abnormal ST/T Wave abnormalities: normal Narrative Interpretation: A-fib with RVR    RADIOLOGY Chest x-ray viewed interpreted by me, pulmonary vascular congestion, no overt edema    PROCEDURES:  Critical Care performed: yes  CRITICAL CARE Performed by: Jene Every   Total critical care time: 30 minutes  Critical care time was exclusive of separately billable procedures and treating other patients.  Critical  care was necessary to treat or prevent imminent or life-threatening deterioration.  Critical care was time spent personally by me on the following activities: development of treatment plan with patient and/or surrogate as well as nursing, discussions with consultants, evaluation of patient's response to treatment, examination of patient, obtaining history from patient or surrogate, ordering and performing treatments and interventions, ordering and review of laboratory studies, ordering and review of radiographic studies, pulse oximetry and re-evaluation of patient's condition.   Procedures   MEDICATIONS ORDERED IN ED: Medications  diltiazem (CARDIZEM) 125 mg in dextrose 5% 125 mL (1 mg/mL) infusion (5 mg/hr Intravenous New Bag/Given 07/13/23 0927)  furosemide (LASIX) injection 40 mg (40 mg Intravenous Given 07/13/23 0919)  diltiazem (CARDIZEM) injection 15 mg (15 mg Intravenous Given 07/13/23  0918)     IMPRESSION / MDM / ASSESSMENT AND PLAN / ED COURSE  I reviewed the triage vital signs and the nursing notes. Patient's presentation is most consistent with severe exacerbation of chronic illness.  Patient presents with shortness of breath primarily when lying flat.  He has a history of CHF and atrial fibrillation.  Heart rate is elevated, he is mildly tachypneic here, bibasilar Rales, strongly suspicious for CHF exacerbation, pulmonary edema.  Differential also includes pneumonia, A-fib with RVR as a cause of shortness of breath.  Heart rate is improved with rest, pending chest x-ray, labs, BNP  Heart rate elevated again, given IV Cardizem, IV Cardizem drip, IV Lasix, chest x-ray consistent with pulmonary vascular congestion, troponin mildly elevated, BNP over 1500  Discussed with the hospitalist for admission      FINAL CLINICAL IMPRESSION(S) / ED DIAGNOSES   Final diagnoses:  Acute on chronic congestive heart failure, unspecified heart failure type (HCC)  Atrial fibrillation with rapid ventricular response (HCC)     Rx / DC Orders   ED Discharge Orders     None        Note:  This document was prepared using Dragon voice recognition software and may include unintentional dictation errors.   Jene Every, MD 07/13/23 [id number]

## 2023-07-13 NOTE — ED Notes (Signed)
ERP informed of pt heart rate, no orders at this time.

## 2023-07-13 NOTE — Progress Notes (Signed)
  Echocardiogram 2D Echocardiogram has been performed.  Donald Molina 07/13/2023, 5:15 PM

## 2023-07-13 NOTE — ED Notes (Addendum)
Hospitalist messaged in regards to pt vitals signs and current cardiac rhythm with concerns of giving the pt PO metoprolol while the diltiazem is going, Diltiazem discontinued, per provider give metoprolol. No other orders at this time

## 2023-07-13 NOTE — ED Notes (Signed)
Pt resting in bed, denies needs at this time.

## 2023-07-13 NOTE — ED Notes (Signed)
PT given urinal 

## 2023-07-14 ENCOUNTER — Observation Stay: Payer: Medicare HMO

## 2023-07-14 DIAGNOSIS — I509 Heart failure, unspecified: Secondary | ICD-10-CM | POA: Diagnosis not present

## 2023-07-14 DIAGNOSIS — I428 Other cardiomyopathies: Secondary | ICD-10-CM | POA: Diagnosis not present

## 2023-07-14 DIAGNOSIS — I5023 Acute on chronic systolic (congestive) heart failure: Secondary | ICD-10-CM | POA: Diagnosis not present

## 2023-07-14 DIAGNOSIS — R0989 Other specified symptoms and signs involving the circulatory and respiratory systems: Secondary | ICD-10-CM | POA: Diagnosis not present

## 2023-07-14 DIAGNOSIS — I10 Essential (primary) hypertension: Secondary | ICD-10-CM | POA: Diagnosis not present

## 2023-07-14 DIAGNOSIS — I4891 Unspecified atrial fibrillation: Secondary | ICD-10-CM | POA: Diagnosis not present

## 2023-07-14 DIAGNOSIS — R7989 Other specified abnormal findings of blood chemistry: Secondary | ICD-10-CM

## 2023-07-14 DIAGNOSIS — I4892 Unspecified atrial flutter: Secondary | ICD-10-CM | POA: Diagnosis not present

## 2023-07-14 DIAGNOSIS — I517 Cardiomegaly: Secondary | ICD-10-CM | POA: Diagnosis not present

## 2023-07-14 DIAGNOSIS — Z7189 Other specified counseling: Secondary | ICD-10-CM

## 2023-07-14 LAB — BASIC METABOLIC PANEL
Anion gap: 11 (ref 5–15)
BUN: 23 mg/dL (ref 8–23)
CO2: 23 mmol/L (ref 22–32)
Calcium: 9 mg/dL (ref 8.9–10.3)
Chloride: 103 mmol/L (ref 98–111)
Creatinine, Ser: 1.03 mg/dL (ref 0.61–1.24)
GFR, Estimated: >60 mL/min (ref >60)
Glucose, Bld: 97 mg/dL (ref 70–99)
Potassium: 3.6 mmol/L (ref 3.5–5.1)
Sodium: 137 mmol/L (ref 135–145)

## 2023-07-14 LAB — MAGNESIUM: Magnesium: 1.7 mg/dL (ref 1.7–2.4)

## 2023-07-14 LAB — BRAIN NATRIURETIC PEPTIDE: B Natriuretic Peptide: 1561 pg/mL — ABNORMAL HIGH (ref 0.0–100.0)

## 2023-07-14 MED ORDER — ZINC SULFATE 220 (50 ZN) MG PO CAPS
220.0000 mg | ORAL_CAPSULE | Freq: Every day | ORAL | Status: DC
  Administered 2023-07-15: 220 mg via ORAL
  Filled 2023-07-14: qty 1

## 2023-07-14 MED ORDER — PROSIGHT PO TABS
1.0000 | ORAL_TABLET | Freq: Every day | ORAL | Status: DC
  Administered 2023-07-14 – 2023-07-15 (×2): 1 via ORAL
  Filled 2023-07-14 (×2): qty 1

## 2023-07-14 NOTE — Care Management Obs Status (Signed)
MEDICARE OBSERVATION STATUS NOTIFICATION   Patient Details  Name: Donald Molina MRN: [id number] Date of Birth: 01-Nov-1951   Medicare Observation Status Notification Given:  Yes    Bing Quarry, RN 07/14/2023, 5:16 PM

## 2023-07-14 NOTE — Progress Notes (Addendum)
Progress Note  Patient Name: Donald Molina Date of Encounter: 07/14/2023  [facility] Cardiologist: Lorine Bears, MD   Patient Profile     Subjective   Feeling better.  COVID + this admit.  Admitted with increased SOB and aflutter with RVR and Cxray c/w acute CHF.  Started on IV Cardizem gtt and now back on home does of Lasix 40mg  BID.  UOP 2.15L yesterday and net - 1.57L since admit.   Inpatient Medications    Scheduled Meds:  apixaban  5 mg Oral BID   furosemide  40 mg Oral BID   losartan  25 mg Oral Daily   metoprolol tartrate  50 mg Oral QID   multivitamin  1 tablet Oral Daily   potassium chloride SA  20 mEq Oral Daily   zinc sulfate  220 mg Oral Daily   Continuous Infusions:  PRN Meds: metoprolol tartrate   Vital Signs    Vitals:   07/14/23 0500 07/14/23 0842 07/14/23 1248 07/14/23 1625  BP:  118/74 105/73 114/77  Pulse:  65 (!) 51 97  Resp:  17 17 17   Temp:  97.9 F (36.6 C) 97.6 F (36.4 C) 98 F (36.7 C)  TempSrc:      SpO2:  98% 92% 100%  Weight: 76.6 kg     Height:        Intake/Output Summary (Last 24 hours) at 07/14/2023 1707 Last data filed at 07/14/2023 0500 Gross per 24 hour  Intake 300 ml  Output 675 ml  Net -375 ml      07/14/2023    5:00 AM 07/13/2023    5:19 PM 07/13/2023    8:13 AM  Last 3 Weights  Weight (lbs) 168 lb 14 oz 166 lb 9.6 oz 172 lb  Weight (kg) 76.6 kg 75.569 kg 78.019 kg      Telemetry    Atrial flutter withHR in the 110's  - Personally Reviewed  ECG    No new EKG to review - Personally Reviewed  Physical Exam   GEN: No acute distress.   Neck: No JVD Cardiac: irregular and tachy, no murmurs, rubs, or gallops.  Respiratory: Clear to auscultation bilaterally. GI: Soft, nontender, non-distended  MS: No edema; No deformity. Neuro:  Nonfocal  Psych: Normal affect   Labs    High Sensitivity Troponin:   Recent Labs  Lab 07/13/23 0821 07/13/23 1130  TROPONINIHS 21* 20*      Chemistry Recent  Labs  Lab 07/13/23 0821 07/14/23 0455  NA 137 137  K 4.0 3.6  CL 104 103  CO2 23 23  GLUCOSE 110* 97  BUN 12 23  CREATININE 0.90 1.03  CALCIUM 9.1 9.0  PROT 8.5*  --   ALBUMIN 4.5  --   AST 48*  --   ALT 22  --   ALKPHOS 56  --   BILITOT 2.2*  --   GFRNONAA >60 >60  ANIONGAP 10 11     Hematology Recent Labs  Lab 07/13/23 0821  WBC 7.4  RBC 4.55  HGB 14.8  HCT 39.5  MCV 86.8  MCH 32.5  MCHC 37.5*  RDW 12.3  PLT 176    BNP Recent Labs  Lab 07/13/23 0821 07/14/23 0455  BNP 1,668.8* 1,561.0*     DDimer No results for input(s): "DDIMER" in the last 168 hours.   CHA2DS2-VASc Score = 3   This indicates a 3.2% annual risk of stroke. The patient's score is based upon: CHF History: 1  HTN History: 1 Diabetes History: 0 Stroke History: 0 Vascular Disease History: 0 Age Score: 1 Gender Score: 0      Radiology    DG Chest 1 View  Result Date: 07/14/2023 CLINICAL DATA:  CHF EXAM: CHEST  1 VIEW COMPARISON:  Yesterday FINDINGS: Chronic cardiomegaly accentuated by rotation. Congested appearance of vessels. No Kerley lines. No pleural effusion or pneumothorax. No consolidation. IMPRESSION: Chronic cardiomegaly and vascular congestion. Electronically Signed   By: Tiburcio Pea M.D.   On: 07/14/2023 07:09   ECHOCARDIOGRAM COMPLETE  Result Date: 07/13/2023    ECHOCARDIOGRAM REPORT   Patient Name:   Donald Molina Date of Exam: 07/13/2023 Medical Rec #:  [id number]       Height:       71.0 in Accession #:    [id number]      Weight:       172.0 lb Date of Birth:  1951/05/15       BSA:          1.978 m Patient Age:    72 years        BP:           114/89 mmHg Patient Gender: M               HR:           77 bpm. Exam Location:  [facility] Procedure: 2D Echo, Cardiac Doppler, Color Doppler and Strain Analysis Indications:     CHF-Acute Diastolic I50.31  History:         Patient has no prior history of Echocardiogram examinations.                  CHF and Cardiomyopathy; Risk  Factors:Hypertension.  Sonographer:     Neysa Bonito Roar Referring Phys:  [id number] Sondra Barges Diagnosing Phys: Armanda Magic MD  Sonographer Comments: Global longitudinal strain was attempted. IMPRESSIONS  1. Left ventricular ejection fraction, by estimation, is 25 to 30%. The left ventricle has severely decreased function. The left ventricle demonstrates global hypokinesis. There is mild left ventricular hypertrophy of the basal-septal segment. Left ventricular diastolic function could not be evaluated. The average left ventricular global longitudinal strain is -9.3 %. The global longitudinal strain is abnormal with apical sparing. Consider cardiac amyloidosis.  2. Right ventricular systolic function is severely reduced. The right ventricular size is mildly enlarged. There is moderately elevated pulmonary artery systolic pressure. The estimated right ventricular systolic pressure is 57.5 mmHg.  3. Left atrial size was severely dilated.  4. The mitral valve is normal in structure. Moderate mitral valve regurgitation. No evidence of mitral stenosis.  5. Tricuspid valve regurgitation is moderate.  6. The aortic valve is tricuspid. Aortic valve regurgitation is trivial. Aortic valve sclerosis/calcification is present, without any evidence of aortic stenosis. Aortic valve area, by VTI measures 2.69 cm. Aortic valve mean gradient measures 1.0 mmHg.  Aortic valve Vmax measures 0.76 m/s.  7. Pulmonic valve regurgitation is moderate.  8. Aortic dilatation noted. There is mild dilatation of the ascending aorta, measuring 39 mm.  9. The inferior vena cava is dilated in size with <50% respiratory variability, suggesting right atrial pressure of 15 mmHg. FINDINGS  Left Ventricle: Left ventricular ejection fraction, by estimation, is 25 to 30%. The left ventricle has severely decreased function. The left ventricle demonstrates global hypokinesis. The average left ventricular global longitudinal strain is -9.3 %. The global  longitudinal strain is abnormal. The left ventricular internal cavity size was normal in size. There  is mild left ventricular hypertrophy of the basal-septal segment. Left ventricular diastolic function could not be evaluated due to atrial fibrillation. Left ventricular diastolic function could not be evaluated. Right Ventricle: The right ventricular size is mildly enlarged. No increase in right ventricular wall thickness. Right ventricular systolic function is severely reduced. There is moderately elevated pulmonary artery systolic pressure. The tricuspid regurgitant velocity is 3.26 m/s, and with an assumed right atrial pressure of 15 mmHg, the estimated right ventricular systolic pressure is 57.5 mmHg. Left Atrium: Left atrial size was severely dilated. Right Atrium: Right atrial size was normal in size. Pericardium: There is no evidence of pericardial effusion. Mitral Valve: The mitral valve is normal in structure. Moderate mitral valve regurgitation. No evidence of mitral valve stenosis. MV peak gradient, 2.4 mmHg. The mean mitral valve gradient is 1.0 mmHg. Tricuspid Valve: The tricuspid valve is normal in structure. Tricuspid valve regurgitation is moderate . No evidence of tricuspid stenosis. Aortic Valve: The aortic valve is tricuspid. Aortic valve regurgitation is trivial. Aortic valve sclerosis/calcification is present, without any evidence of aortic stenosis. Aortic valve mean gradient measures 1.0 mmHg. Aortic valve peak gradient measures 2.3 mmHg. Aortic valve area, by VTI measures 2.69 cm. Pulmonic Valve: The pulmonic valve was normal in structure. Pulmonic valve regurgitation is moderate. No evidence of pulmonic stenosis. Aorta: Aortic dilatation noted. There is mild dilatation of the ascending aorta, measuring 39 mm. Venous: The inferior vena cava is dilated in size with less than 50% respiratory variability, suggesting right atrial pressure of 15 mmHg. IAS/Shunts: No atrial level shunt detected by  color flow Doppler.  LEFT VENTRICLE PLAX 2D LVIDd:         5.00 cm      Diastology LVIDs:         4.30 cm      LV e' medial:    4.79 cm/s LV PW:         0.80 cm      LV E/e' medial:  14.4 LV IVS:        1.30 cm      LV e' lateral:   8.16 cm/s LVOT diam:     2.20 cm      LV E/e' lateral: 8.5 LV SV:         26 LV SV Index:   13           2D Longitudinal Strain LVOT Area:     3.80 cm     2D Strain GLS (A2C):   -9.2 %                             2D Strain GLS (A3C):   -8.9 %                             2D Strain GLS (A4C):   -9.8 % LV Volumes (MOD)            2D Strain GLS Avg:     -9.3 % LV vol d, MOD A2C: 146.0 ml LV vol d, MOD A4C: 140.0 ml LV vol s, MOD A2C: 115.0 ml LV vol s, MOD A4C: 98.9 ml LV SV MOD A2C:     31.0 ml LV SV MOD A4C:     140.0 ml LV SV MOD BP:      37.5 ml RIGHT VENTRICLE RV Basal diam:  4.00 cm RV Mid diam:  3.50 cm RV S prime:     8.92 cm/s TAPSE (M-mode): 1.0 cm LEFT ATRIUM              Index        RIGHT ATRIUM           Index LA diam:        4.50 cm  2.28 cm/m   RA Area:     16.40 cm LA Vol (A2C):   70.9 ml  35.85 ml/m  RA Volume:   37.70 ml  19.06 ml/m LA Vol (A4C):   133.0 ml 67.25 ml/m LA Biplane Vol: 102.0 ml 51.58 ml/m  AORTIC VALVE                    PULMONIC VALVE AV Area (Vmax):    2.06 cm     PV Vmax:          0.66 m/s AV Area (Vmean):   2.18 cm     PV Peak grad:     1.7 mmHg AV Area (VTI):     2.69 cm     PR End Diast Vel: 23.81 msec AV Vmax:           76.40 cm/s   RVOT Peak grad:   0 mmHg AV Vmean:          49.500 cm/s AV VTI:            0.095 m AV Peak Grad:      2.3 mmHg AV Mean Grad:      1.0 mmHg LVOT Vmax:         41.50 cm/s LVOT Vmean:        28.400 cm/s LVOT VTI:          0.067 m LVOT/AV VTI ratio: 0.71  AORTA Ao Root diam: 3.20 cm Ao Asc diam:  3.90 cm MITRAL VALVE               TRICUSPID VALVE MV Area (PHT): 6.22 cm    TR Peak grad:   42.5 mmHg MV Area VTI:   1.85 cm    TR Vmax:        326.00 cm/s MV Peak grad:  2.4 mmHg MV Mean grad:  1.0 mmHg    SHUNTS MV  Vmax:       0.78 m/s    Systemic VTI:  0.07 m MV Vmean:      46.2 cm/s   Systemic Diam: 2.20 cm MV Decel Time: 122 msec MV E velocity: 69.20 cm/s Armanda Magic MD Electronically signed by Armanda Magic MD Signature Date/Time: 07/13/2023/5:49:37 PM    Final    DG Chest Port 1 View  Result Date: 07/13/2023 CLINICAL DATA:  Shortness of breath for several days. Worse while ambulating and lying flat. Lower abdominal pain. History of CHF. EXAM: PORTABLE CHEST 1 VIEW COMPARISON:  04/06/2022 FINDINGS: Cardiac enlargement, stable. No signs of pleural effusion. Increased pulmonary vascular congestion. No airspace consolidation. The visualized osseous structures are unremarkable. IMPRESSION: Cardiac enlargement and increased pulmonary vascular congestion. Electronically Signed   By: Signa Kell M.D.   On: 07/13/2023 09:23    Patient Profile     72 y.o. male  with a hx of normal coronary arteries by LHC in 04/2018, HFrEF secondary to NICM, persistent A-fib status post DCCV in 04/2022, HTN, and medication noncompliance leading to recurrent hospital admissions who is being seen today for the evaluation of Afib with RVR at the request of Dr.  Zhang.   Assessment & Plan    Aflutter with RVR.   -Started on IV Cardizem gtt with improvement in HR.   -He states that he has not missed any doses of Eliquis in the past 3 weeks.  -TSH is normal.   -HR improved but still elevated at times so will continue IV Cardizem gtt for now but in light of LV dysfunction will try to get off tomorrow based on EP recs for AADT -continue Lopressor 50mg  q6 hours >> Ultimately can transition to Toprol before discharge   -In review of records his Amio was stopped after DCCV due to bradycardia and age.  -2D echo this admit shows severe LAE so I do not think he will maintain NSR without AADT.  May be a candidate for Tikosyn.  Not candidate for Flecainide due to NICM -Will get EP consult tomorrow to help with afib treatment -continue Apixaban  5mg  BID   Acute HFimpEF/NICM (tachy mediated) -Cath 2019 with normal cors -missed at dose of Lasix a week ago -has had multiple admits in the past with recurrent afib with RVR and subsequent drops in EF which improve once NSR is reestablished -now back in aflutter with RVR that is likely contributing to decline in EF and acute CHF -2D echo this admit showed EF 25-30% with global HK, severe LAE, moderate MR and TR and severe RV dysfunction -BNp elevated at 1678 and got 1 dose of Lasix IV yesterday in the ER -now back on Lasix 40mg  BID PO as he appeared euvolemic yesterday in ER -he is diuresing well on PO Lasix and put out 2.15L and is net neg 1.57L since admit -continue Lopressor and consolidate to Toprol at discharge -continue Losartan and transition to [facility] as BP allows -Will need to be placed on MRA prior to d/c or outpt  -weight down 4 lbs from admit   Elevated Trop -minimal elevation with flat trend (21>>20) and no c/w ACS>>suspect demand ischemia in setting of acute CHF and aflutter with RVR -normal cors in 2019 -no further ischemic workup at this time   HTN -BP improved -continue Lopressor 50mg  6 hrs and Losartan 25mg  daily.  -will wean off Cardizem due to LV dysfunction       For questions or updates, please contact  [facility] Please consult [email or web address] for contact info under        Signed, Armanda Magic, MD  07/14/2023, 5:07 PM

## 2023-07-14 NOTE — Progress Notes (Signed)
PROGRESS NOTE  Edson Fruth    DOB: 1951/04/20, 72 y.o.  JWJ:[id number]    Code Status: Full Code   DOA: 07/13/2023   LOS: 0   Brief hospital course  Brentley Minckler is a 72 y.o. male with a PMH significant for PAF status post cardioversion July 2023, on Eliquis, chronic combined HFrEF and HFpEF with recovered LVEF, HTN, NICM .  They presented from home to the ED on 07/13/2023 with SOB x 2 days. Endorses PND and orthopnea.   In the ED, it was found that they had Heart rate in the 130-150s, blood pressure significantly elevated 160/110, no hypoxia.  Significant findings included: Chest x-ray showed cardiomegaly and pulmonary congestion. K4.0, creatinine 0.9, hemoglobin 14.8 WBC 7.4. COVID-19 +, BNP 1,668. Troponin 21>20  They were initially treated with Cardizem bolus plus drip, 1 dose of IV Lasix. Cardiology was consulted.    Patient was admitted to medicine service for further workup and management of Aflutter with RVR as outlined in detail below.  07/14/23 -stable. Wants to go home  Assessment & Plan  Principal Problem:   A-fib [facility]) Active Problems:   Acute on chronic combined systolic and diastolic CHF (congestive heart failure) (HCC)   Atrial fibrillation with rapid ventricular response (HCC)  Persistent Afib with RVR: currently rate controlled.  - cards consulted, appreciate your recs -continue metoprolol QID -Remains on diltiazem drip, wean as able - apixaban 5 mg twice daily -Should his ventricular rates remain difficult to control, would need to pursue cardioversion early next week, prior to discharge.  - recommended EP consult for AADT   HFimpEF secondary NICM: echo 25-30% EF with severely decreased function and global hypokinesis. BNP improved today. ORA. Walking without SOB or CP -PTA losartan  -Escalate GDMT pending echo findings once ventricular rate has improved -Repeat chest x-ray for 8/18 pending, trend BNP at that time as well  COVID- 19- may have  contributed to SOB PTA but symptoms are mild. ORA - supportive care PRN - patient requested multivitamin and zinc  HTN: well controlled on current regimen -Metoprolol and losartan ordered as outlined above  Body mass index is 23.55 kg/m.  VTE ppx:  apixaban (ELIQUIS) tablet 5 mg   Diet:     Diet   Diet Heart Room service appropriate? Yes; Fluid consistency: Thin; Fluid restriction: 2000 mL Fluid   Consultants: Cardiology   Subjective 07/14/23    Pt reports feeling well. Denies LEE, CP, palpitations, SOB at rest nor while ambulating around his room. He would like to go home.    Objective   Vitals:   07/13/23 2044 07/13/23 2355 07/14/23 0426 07/14/23 0500  BP: 117/78 (!) 113/95 95/81   Pulse: 95 100 (!) 51   Resp: 20 18 18    Temp: 98.3 F (36.8 C) 98.2 F (36.8 C) 98.1 F (36.7 C)   TempSrc: Oral     SpO2: 95% 96% 100%   Weight:    76.6 kg  Height:        Intake/Output Summary (Last 24 hours) at 07/14/2023 0744 Last data filed at 07/14/2023 0500 Gross per 24 hour  Intake 577.95 ml  Output 2150 ml  Net -1572.05 ml   Filed Weights   07/13/23 0813 07/13/23 1719 07/14/23 0500  Weight: 78 kg 75.6 kg 76.6 kg     Physical Exam:  General: awake, alert, NAD HEENT: atraumatic, clear conjunctiva, anicteric sclera, MMM, hearing grossly normal Respiratory: normal respiratory effort. Cardiovascular: quick capillary refill, normal S1/S2, RRR, no JVD,  murmurs Gastrointestinal: soft, NT, ND Nervous: A&O x3. no gross focal neurologic deficits, normal speech Extremities: moves all equally, no edema, normal tone Skin: dry, intact, normal temperature, normal color. No rashes, lesions or ulcers on exposed skin Psychiatry: normal mood, congruent affect  Labs   I have personally reviewed the following labs and imaging studies CBC    Component Value Date/Time   WBC 7.4 07/13/2023 0821   RBC 4.55 07/13/2023 0821   HGB 14.8 07/13/2023 0821   HGB 14.8 08/04/2020 0950   HCT  39.5 07/13/2023 0821   HCT 41.4 08/04/2020 0950   PLT 176 07/13/2023 0821   PLT 191 08/04/2020 0950   MCV 86.8 07/13/2023 0821   MCV 92 08/04/2020 0950   MCH 32.5 07/13/2023 0821   MCHC 37.5 (H) 07/13/2023 0821   RDW 12.3 07/13/2023 0821   RDW 12.5 08/04/2020 0950   LYMPHSABS 1.6 04/19/2021 2059   MONOABS 0.4 04/19/2021 2059   EOSABS 0.1 04/19/2021 2059   BASOSABS 0.0 04/19/2021 2059      Latest Ref Rng & Units 07/14/2023    4:55 AM 07/13/2023    8:21 AM 07/16/2022    9:10 AM  BMP  Glucose 70 - 99 mg/dL 97  161  96   BUN 8 - 23 mg/dL 23  12  19    Creatinine 0.61 - 1.24 mg/dL 0.96  0.45  4.09   Sodium 135 - 145 mmol/L 137  137  137   Potassium 3.5 - 5.1 mmol/L 3.6  4.0  4.2   Chloride 98 - 111 mmol/L 103  104  105   CO2 22 - 32 mmol/L 23  23  24    Calcium 8.9 - 10.3 mg/dL 9.0  9.1  9.2     DG Chest 1 View  Result Date: 07/14/2023 CLINICAL DATA:  CHF EXAM: CHEST  1 VIEW COMPARISON:  Yesterday FINDINGS: Chronic cardiomegaly accentuated by rotation. Congested appearance of vessels. No Kerley lines. No pleural effusion or pneumothorax. No consolidation. IMPRESSION: Chronic cardiomegaly and vascular congestion. Electronically Signed   By: Tiburcio Pea M.D.   On: 07/14/2023 07:09   ECHOCARDIOGRAM COMPLETE  Result Date: 07/13/2023    ECHOCARDIOGRAM REPORT   Patient Name:   JAYSHON RINGENBERG Date of Exam: 07/13/2023 Medical Rec #:  [id number]       Height:       71.0 in Accession #:    [id number]      Weight:       172.0 lb Date of Birth:  1951-04-13       BSA:          1.978 m Patient Age:    71 years        BP:           114/89 mmHg Patient Gender: M               HR:           77 bpm. Exam Location:  [facility] Procedure: 2D Echo, Cardiac Doppler, Color Doppler and Strain Analysis Indications:     CHF-Acute Diastolic I50.31  History:         Patient has no prior history of Echocardiogram examinations.                  CHF and Cardiomyopathy; Risk Factors:Hypertension.  Sonographer:     Neysa Bonito  Roar Referring Phys:  [id number] Sondra Barges Diagnosing Phys: Armanda Magic MD  Sonographer Comments: Global longitudinal strain was attempted. IMPRESSIONS  1. Left ventricular ejection fraction, by estimation, is 25 to 30%. The left ventricle has severely decreased function. The left ventricle demonstrates global hypokinesis. There is mild left ventricular hypertrophy of the basal-septal segment. Left ventricular diastolic function could not be evaluated. The average left ventricular global longitudinal strain is -9.3 %. The global longitudinal strain is abnormal with apical sparing. Consider cardiac amyloidosis.  2. Right ventricular systolic function is severely reduced. The right ventricular size is mildly enlarged. There is moderately elevated pulmonary artery systolic pressure. The estimated right ventricular systolic pressure is 57.5 mmHg.  3. Left atrial size was severely dilated.  4. The mitral valve is normal in structure. Moderate mitral valve regurgitation. No evidence of mitral stenosis.  5. Tricuspid valve regurgitation is moderate.  6. The aortic valve is tricuspid. Aortic valve regurgitation is trivial. Aortic valve sclerosis/calcification is present, without any evidence of aortic stenosis. Aortic valve area, by VTI measures 2.69 cm. Aortic valve mean gradient measures 1.0 mmHg.  Aortic valve Vmax measures 0.76 m/s.  7. Pulmonic valve regurgitation is moderate.  8. Aortic dilatation noted. There is mild dilatation of the ascending aorta, measuring 39 mm.  9. The inferior vena cava is dilated in size with <50% respiratory variability, suggesting right atrial pressure of 15 mmHg. FINDINGS  Left Ventricle: Left ventricular ejection fraction, by estimation, is 25 to 30%. The left ventricle has severely decreased function. The left ventricle demonstrates global hypokinesis. The average left ventricular global longitudinal strain is -9.3 %. The global longitudinal strain is abnormal. The left ventricular  internal cavity size was normal in size. There is mild left ventricular hypertrophy of the basal-septal segment. Left ventricular diastolic function could not be evaluated due to atrial fibrillation. Left ventricular diastolic function could not be evaluated. Right Ventricle: The right ventricular size is mildly enlarged. No increase in right ventricular wall thickness. Right ventricular systolic function is severely reduced. There is moderately elevated pulmonary artery systolic pressure. The tricuspid regurgitant velocity is 3.26 m/s, and with an assumed right atrial pressure of 15 mmHg, the estimated right ventricular systolic pressure is 57.5 mmHg. Left Atrium: Left atrial size was severely dilated. Right Atrium: Right atrial size was normal in size. Pericardium: There is no evidence of pericardial effusion. Mitral Valve: The mitral valve is normal in structure. Moderate mitral valve regurgitation. No evidence of mitral valve stenosis. MV peak gradient, 2.4 mmHg. The mean mitral valve gradient is 1.0 mmHg. Tricuspid Valve: The tricuspid valve is normal in structure. Tricuspid valve regurgitation is moderate . No evidence of tricuspid stenosis. Aortic Valve: The aortic valve is tricuspid. Aortic valve regurgitation is trivial. Aortic valve sclerosis/calcification is present, without any evidence of aortic stenosis. Aortic valve mean gradient measures 1.0 mmHg. Aortic valve peak gradient measures 2.3 mmHg. Aortic valve area, by VTI measures 2.69 cm. Pulmonic Valve: The pulmonic valve was normal in structure. Pulmonic valve regurgitation is moderate. No evidence of pulmonic stenosis. Aorta: Aortic dilatation noted. There is mild dilatation of the ascending aorta, measuring 39 mm. Venous: The inferior vena cava is dilated in size with less than 50% respiratory variability, suggesting right atrial pressure of 15 mmHg. IAS/Shunts: No atrial level shunt detected by color flow Doppler.  LEFT VENTRICLE PLAX 2D LVIDd:          5.00 cm      Diastology LVIDs:         4.30 cm      LV e' medial:    4.79 cm/s LV PW:  0.80 cm      LV E/e' medial:  14.4 LV IVS:        1.30 cm      LV e' lateral:   8.16 cm/s LVOT diam:     2.20 cm      LV E/e' lateral: 8.5 LV SV:         26 LV SV Index:   13           2D Longitudinal Strain LVOT Area:     3.80 cm     2D Strain GLS (A2C):   -9.2 %                             2D Strain GLS (A3C):   -8.9 %                             2D Strain GLS (A4C):   -9.8 % LV Volumes (MOD)            2D Strain GLS Avg:     -9.3 % LV vol d, MOD A2C: 146.0 ml LV vol d, MOD A4C: 140.0 ml LV vol s, MOD A2C: 115.0 ml LV vol s, MOD A4C: 98.9 ml LV SV MOD A2C:     31.0 ml LV SV MOD A4C:     140.0 ml LV SV MOD BP:      37.5 ml RIGHT VENTRICLE RV Basal diam:  4.00 cm RV Mid diam:    3.50 cm RV S prime:     8.92 cm/s TAPSE (M-mode): 1.0 cm LEFT ATRIUM              Index        RIGHT ATRIUM           Index LA diam:        4.50 cm  2.28 cm/m   RA Area:     16.40 cm LA Vol (A2C):   70.9 ml  35.85 ml/m  RA Volume:   37.70 ml  19.06 ml/m LA Vol (A4C):   133.0 ml 67.25 ml/m LA Biplane Vol: 102.0 ml 51.58 ml/m  AORTIC VALVE                    PULMONIC VALVE AV Area (Vmax):    2.06 cm     PV Vmax:          0.66 m/s AV Area (Vmean):   2.18 cm     PV Peak grad:     1.7 mmHg AV Area (VTI):     2.69 cm     PR End Diast Vel: 23.81 msec AV Vmax:           76.40 cm/s   RVOT Peak grad:   0 mmHg AV Vmean:          49.500 cm/s AV VTI:            0.095 m AV Peak Grad:      2.3 mmHg AV Mean Grad:      1.0 mmHg LVOT Vmax:         41.50 cm/s LVOT Vmean:        28.400 cm/s LVOT VTI:          0.067 m LVOT/AV VTI ratio: 0.71  AORTA Ao Root diam: 3.20 cm Ao Asc diam:  3.90 cm MITRAL VALVE  TRICUSPID VALVE MV Area (PHT): 6.22 cm    TR Peak grad:   42.5 mmHg MV Area VTI:   1.85 cm    TR Vmax:        326.00 cm/s MV Peak grad:  2.4 mmHg MV Mean grad:  1.0 mmHg    SHUNTS MV Vmax:       0.78 m/s    Systemic VTI:  0.07 m MV Vmean:       46.2 cm/s   Systemic Diam: 2.20 cm MV Decel Time: 122 msec MV E velocity: 69.20 cm/s Armanda Magic MD Electronically signed by Armanda Magic MD Signature Date/Time: 07/13/2023/5:49:37 PM    Final    DG Chest Port 1 View  Result Date: 07/13/2023 CLINICAL DATA:  Shortness of breath for several days. Worse while ambulating and lying flat. Lower abdominal pain. History of CHF. EXAM: PORTABLE CHEST 1 VIEW COMPARISON:  04/06/2022 FINDINGS: Cardiac enlargement, stable. No signs of pleural effusion. Increased pulmonary vascular congestion. No airspace consolidation. The visualized osseous structures are unremarkable. IMPRESSION: Cardiac enlargement and increased pulmonary vascular congestion. Electronically Signed   By: Signa Kell M.D.   On: 07/13/2023 09:23    Disposition Plan & Communication  Patient status: Observation  Admitted From: Home Planned disposition location: Home Anticipated discharge date: 8/19 pending cardiology clearance   Family Communication: none at bedside    Author: Leeroy Bock, DO [facility] 07/14/2023, 7:44 AM   Available by Epic secure chat 7AM-7PM. If 7PM-7AM, please contact night-coverage.  [facility] contact information found on [email or web address].

## 2023-07-14 NOTE — Plan of Care (Signed)
Patient denies complaints and has been free of injury, falls or worsening covid infection.  Patient denies pain or dyspnea. Nursing will continue to monitor patients progress towards discharge planning and goals.

## 2023-07-15 ENCOUNTER — Other Ambulatory Visit (HOSPITAL_COMMUNITY): Payer: Self-pay

## 2023-07-15 DIAGNOSIS — I428 Other cardiomyopathies: Secondary | ICD-10-CM | POA: Diagnosis present

## 2023-07-15 DIAGNOSIS — I4891 Unspecified atrial fibrillation: Secondary | ICD-10-CM | POA: Diagnosis not present

## 2023-07-15 DIAGNOSIS — I214 Non-ST elevation (NSTEMI) myocardial infarction: Secondary | ICD-10-CM | POA: Diagnosis present

## 2023-07-15 DIAGNOSIS — I2489 Other forms of acute ischemic heart disease: Secondary | ICD-10-CM | POA: Diagnosis present

## 2023-07-15 DIAGNOSIS — I5043 Acute on chronic combined systolic (congestive) and diastolic (congestive) heart failure: Secondary | ICD-10-CM | POA: Diagnosis present

## 2023-07-15 DIAGNOSIS — Z8249 Family history of ischemic heart disease and other diseases of the circulatory system: Secondary | ICD-10-CM | POA: Diagnosis not present

## 2023-07-15 DIAGNOSIS — Z79899 Other long term (current) drug therapy: Secondary | ICD-10-CM | POA: Diagnosis not present

## 2023-07-15 DIAGNOSIS — R000 Tachycardia, unspecified: Secondary | ICD-10-CM | POA: Diagnosis present

## 2023-07-15 DIAGNOSIS — Z8049 Family history of malignant neoplasm of other genital organs: Secondary | ICD-10-CM | POA: Diagnosis not present

## 2023-07-15 DIAGNOSIS — R0602 Shortness of breath: Secondary | ICD-10-CM | POA: Diagnosis present

## 2023-07-15 DIAGNOSIS — I110 Hypertensive heart disease with heart failure: Secondary | ICD-10-CM | POA: Diagnosis present

## 2023-07-15 DIAGNOSIS — I4892 Unspecified atrial flutter: Secondary | ICD-10-CM | POA: Diagnosis present

## 2023-07-15 DIAGNOSIS — Z800 Family history of malignant neoplasm of digestive organs: Secondary | ICD-10-CM | POA: Diagnosis not present

## 2023-07-15 DIAGNOSIS — Z888 Allergy status to other drugs, medicaments and biological substances status: Secondary | ICD-10-CM | POA: Diagnosis not present

## 2023-07-15 DIAGNOSIS — Z820 Family history of epilepsy and other diseases of the nervous system: Secondary | ICD-10-CM | POA: Diagnosis not present

## 2023-07-15 DIAGNOSIS — U071 COVID-19: Secondary | ICD-10-CM

## 2023-07-15 DIAGNOSIS — I509 Heart failure, unspecified: Secondary | ICD-10-CM | POA: Diagnosis not present

## 2023-07-15 DIAGNOSIS — Z7901 Long term (current) use of anticoagulants: Secondary | ICD-10-CM | POA: Diagnosis not present

## 2023-07-15 DIAGNOSIS — I081 Rheumatic disorders of both mitral and tricuspid valves: Secondary | ICD-10-CM | POA: Diagnosis present

## 2023-07-15 DIAGNOSIS — Z87891 Personal history of nicotine dependence: Secondary | ICD-10-CM | POA: Diagnosis not present

## 2023-07-15 DIAGNOSIS — I4819 Other persistent atrial fibrillation: Secondary | ICD-10-CM | POA: Diagnosis present

## 2023-07-15 LAB — COMPREHENSIVE METABOLIC PANEL
ALT: 16 U/L (ref 0–44)
AST: 27 U/L (ref 15–41)
Albumin: 3.8 g/dL (ref 3.5–5.0)
Alkaline Phosphatase: 53 U/L (ref 38–126)
Anion gap: 9 (ref 5–15)
BUN: 30 mg/dL — ABNORMAL HIGH (ref 8–23)
CO2: 25 mmol/L (ref 22–32)
Calcium: 8.9 mg/dL (ref 8.9–10.3)
Chloride: 102 mmol/L (ref 98–111)
Creatinine, Ser: 1 mg/dL (ref 0.61–1.24)
GFR, Estimated: >60 mL/min (ref >60)
Glucose, Bld: 131 mg/dL — ABNORMAL HIGH (ref 70–99)
Potassium: 3.2 mmol/L — ABNORMAL LOW (ref 3.5–5.1)
Sodium: 136 mmol/L (ref 135–145)
Total Bilirubin: 1.3 mg/dL — ABNORMAL HIGH (ref 0.3–1.2)
Total Protein: 7.6 g/dL (ref 6.5–8.1)

## 2023-07-15 LAB — CBC
HCT: 37.8 % — ABNORMAL LOW (ref 39.0–52.0)
Hemoglobin: 14 g/dL (ref 13.0–17.0)
MCH: 32.4 pg (ref 26.0–34.0)
MCHC: 37 g/dL — ABNORMAL HIGH (ref 30.0–36.0)
MCV: 87.5 fL (ref 80.0–100.0)
Platelets: 178 10*3/uL (ref 150–400)
RBC: 4.32 MIL/uL (ref 4.22–5.81)
RDW: 12.2 % (ref 11.5–15.5)
WBC: 6.1 10*3/uL (ref 4.0–10.5)
nRBC: 0 % (ref 0.0–0.2)

## 2023-07-15 MED ORDER — METOPROLOL TARTRATE 50 MG PO TABS
50.0000 mg | ORAL_TABLET | Freq: Two times a day (BID) | ORAL | Status: DC
  Administered 2023-07-15: 50 mg via ORAL
  Filled 2023-07-15: qty 1

## 2023-07-15 MED ORDER — ZINC SULFATE 220 (50 ZN) MG PO CAPS: 220.0000 mg | ORAL_CAPSULE | Freq: Every day | ORAL | 0 refills | Status: AC

## 2023-07-15 MED ORDER — METOPROLOL TARTRATE 50 MG PO TABS: 50.0000 mg | ORAL_TABLET | Freq: Two times a day (BID) | ORAL | 0 refills | Status: DC

## 2023-07-15 MED ORDER — LOSARTAN POTASSIUM 25 MG PO TABS: 12.5000 mg | ORAL_TABLET | Freq: Every day | ORAL | 0 refills | Status: DC

## 2023-07-15 NOTE — Plan of Care (Signed)

## 2023-07-15 NOTE — Progress Notes (Addendum)
[facility] HF Stewardship  PCP: Pcp, No  PCP-Cardiologist: Lorine Bears, MD  HPI: Donald Molina is a 72 y.o. male with PAF (DCCV July 2023) on Eliquis, chronic combined HFrEF and HFpEF with recovered LVEF, HTN, NICM  who presented with shortness of breath x 2 days. Found to be in Aflutter RVR on admission. Longstanding history of non-ischemic cardiomyopathy. TTE in 2019 showed LVEF of 25-30% with diffuse hypokinesis. LVEF improved to 50-55% on TTE 07/2022. This admission echo showed LVEF has been reduced again to 25-30%. Initially started on diltiazem infusion, but discontinued given low LVEF. Metoprolol was originally given 4 times daily, but reduced to twice daily 8/19. COVID-19 +.  Pertinent Lab Values: Creatinine, Ser  Date Value Ref Range Status  07/15/2023 1.00 0.61 - 1.24 mg/dL Final   BUN  Date Value Ref Range Status  07/15/2023 30 (H) 8 - 23 mg/dL Final  16/08/9603 10 8 - 27 mg/dL Final   Potassium  Date Value Ref Range Status  07/15/2023 3.2 (L) 3.5 - 5.1 mmol/L Final   Sodium  Date Value Ref Range Status  07/15/2023 136 135 - 145 mmol/L Final  08/04/2020 141 134 - 144 mmol/L Final   B Natriuretic Peptide  Date Value Ref Range Status  07/14/2023 1,561.0 (H) 0.0 - 100.0 pg/mL Final    Comment:    Performed at [facility], [address] [place], [place] [address]   Magnesium  Date Value Ref Range Status  07/14/2023 1.7 1.7 - 2.4 mg/dL Final    Comment:    Performed at [facility], [address] [place], [place] [address]   TSH  Date Value Ref Range Status  07/13/2023 2.199 0.350 - 4.500 uIU/mL Final    Comment:    Performed by a 3rd Generation assay with a functional sensitivity of <=0.01 uIU/mL. Performed at [facility], [address] [place], [place] [address]   08/04/2020 1.210 0.450 - 4.500 uIU/mL Final    Vital Signs: Temp:  [97.6 F (36.4 C)-98.6 F (37 C)] 98.2 F (36.8 C) (08/19 0914) Pulse Rate:  [51-97] 92  (08/19 0914) Cardiac Rhythm: Atrial flutter (08/19 0741) Resp:  [16-22] 22 (08/19 0914) BP: (87-114)/(55-79) 105/79 (08/19 0914) SpO2:  [92 %-100 %] 100 % (08/19 0914)   Intake/Output Summary (Last 24 hours) at 07/15/2023 1039 Last data filed at 07/15/2023 0600 Gross per 24 hour  Intake 480 ml  Output 500 ml  Net -20 ml    Current Inpatient Medications:  Loop Diuretic: Furosemide 40 mg PO BID Beta Blocker: Metoprolol tartrate 50 mg BID Angiotensin-Converting Enzyme Inhibitor (ACEi) or Angiotensin II Receptor Blocker (ARB): Losartan 25 mg daily   Prior to admission HF Medications:  Loop Diuretic: Furosemide 40 mg BID Beta Blocker: Metoprolol succinate 50 mg daily Angiotensin-Converting Enzyme Inhibitor (ACEi) or Angiotensin II Receptor Blocker (ARB): Losartan 25 mg daily  No fill history Assessment: 1. Acute heart failure (LVEF 20-25%), due to non-ICM. NYHA class II-III symptoms.  -BP currently too soft for GDMT titration. Can consider SGLT2i given least effect on BP. HR improved to 90s. -Wt up 2 lbs. Low urine output documented. Creatinine stable.  Plan: 1) Medication changes recommended at this time: -Non  2) Patient assistance: Sherryll Burger, Farxiga, and Jardiance copay $0  3) Education: -To be completed prior to discharge.   Medication Assistance / Insurance Benefits Check:  Does the patient have prescription insurance?    Type of insurance plan:   Does the patient qualify for medication assistance  through manufacturers or grants? No   Eligible grants and/or patient assistance programs: no  Outpatient Pharmacy:  Prior to admission outpatient pharmacy: none  Thank you for involving pharmacy in this patient's care.  Enos Fling, PharmD, BCPS Phone - [phone] Clinical Pharmacist 07/15/2023 10:47 AM

## 2023-07-15 NOTE — Discharge Instructions (Signed)

## 2023-07-15 NOTE — Discharge Summary (Signed)
Physician Discharge Summary  Patient: Donald Molina OVF:[id number] DOB: 10-Nov-1951   Code Status: Full Code Admit date: 07/13/2023 Discharge date: 07/15/2023 Disposition: Home, No home health services recommended PCP: Pcp, No  Recommendations for Outpatient Follow-up:  Follow up with PCP within 1-2 weeks Regarding general hospital follow up and preventative care Recommend monitoring blood rpessure Follow up with cardiology Electrophysiology consult  Discharge Diagnoses:  Principal Problem:   A-fib [facility]) Active Problems:   Acute on chronic systolic heart failure (HCC)   Atrial fibrillation with rapid ventricular response (HCC)   Atrial flutter with rapid ventricular response (HCC)   NICM (nonischemic cardiomyopathy) (HCC)   Educated about COVID-19 virus infection   NSTEMI (non-ST elevated myocardial infarction) (HCC)   COVID-19 virus infection  Brief Hospital Course Summary: Donald Molina is a 72 y.o. male with a PMH significant for PAF status post cardioversion July 2023, on Eliquis, chronic combined HFrEF and HFpEF with recovered LVEF, HTN, NICM .   They presented from home to the ED on 07/13/2023 with SOB x 2 days. Endorses PND and orthopnea.    In the ED, it was found that they had Heart rate in the 130-150s, blood pressure significantly elevated 160/110, no hypoxia.  Significant findings included: Chest x-ray showed cardiomegaly and pulmonary congestion. K4.0, creatinine 0.9, hemoglobin 14.8 WBC 7.4. COVID-19 +, BNP 1,668. Troponin 21>20   They were initially treated with Cardizem bolus plus drip, 1 dose of IV Lasix. Cardiology was consulted.     Patient was admitted to medicine service for further workup and management of Aflutter with RVR as outlined in detail below.   Converted to sinus rhythm on the cardizem drip and was able to wean off of it. He was started on metoprolol QID which held him well controlled throughout the remainder of his admission. He was  asymptomatic and on eliquis. EP was consulted but did not see inpatient. He was instructed by cardiology to follow up with EP outpatient.  Echo this admission showed EF 25-30% with global hypokinesis.  His COVID-19 infection may have contributed to the arrhythmia onset but he did not have significant respiratory symptoms and remained stable ORA.  All other chronic conditions were treated with home medications.  Discharge Condition: Good, improved Recommended discharge diet: Cardiac diet  Consultations: Cardiology, EP  Procedures/Studies: Echo 8/17  Allergies as of 07/15/2023       Reactions   Entresto [sacubitril-valsartan] Other (See Comments)   hypotension        Medication List     STOP taking these medications    metoprolol succinate 100 MG 24 hr tablet Commonly known as: TOPROL-XL       TAKE these medications    apixaban 5 MG Tabs tablet Commonly known as: ELIQUIS Take 1 tablet (5 mg total) by mouth 2 (two) times daily.   furosemide 40 MG tablet Commonly known as: LASIX Take 1 tablet (40 mg total) by mouth 2 (two) times daily.   losartan 25 MG tablet Commonly known as: COZAAR Take 0.5 tablets (12.5 mg total) by mouth daily. What changed: how much to take   metoprolol tartrate 50 MG tablet Commonly known as: LOPRESSOR Take 1 tablet (50 mg total) by mouth 2 (two) times daily.   potassium chloride SA 20 MEQ tablet Commonly known as: KLOR-CON M Take 1 tablet (20 mEq total) by mouth daily.   zinc sulfate 220 (50 Zn) MG capsule Take 1 capsule (220 mg total) by mouth daily. Start taking on: July 16, 2023        Follow-up Information     Iran Ouch, MD. Schedule an appointment as soon as possible for a visit in 3 day(s).   Specialty: Cardiology Contact information: [address] [place] [address] [phone]                 Subjective   Pt reports no complaints today. He wants to go home. Denies chest pain,  SOB, palpitations.   All questions and concerns were addressed at time of discharge.  Objective  Blood pressure (!) 133/114, pulse (!) 44, temperature 98 F (36.7 C), temperature source Oral, resp. rate (!) 27, height 5\' 11"  (1.803 m), weight 76.6 kg, SpO2 100%.   General: Pt is alert, awake, not in acute distress Cardiovascular: RRR, S1/S2 +, no rubs, no gallops Respiratory: CTA bilaterally, no wheezing, no rhonchi Abdominal: Soft, NT, ND, bowel sounds + Extremities: no edema, no cyanosis  The results of significant diagnostics from this hospitalization (including imaging, microbiology, ancillary and laboratory) are listed below for reference.   Imaging studies: DG Chest 1 View  Result Date: 07/14/2023 CLINICAL DATA:  CHF EXAM: CHEST  1 VIEW COMPARISON:  Yesterday FINDINGS: Chronic cardiomegaly accentuated by rotation. Congested appearance of vessels. No Kerley lines. No pleural effusion or pneumothorax. No consolidation. IMPRESSION: Chronic cardiomegaly and vascular congestion. Electronically Signed   By: Tiburcio Pea M.D.   On: 07/14/2023 07:09   ECHOCARDIOGRAM COMPLETE  Result Date: 07/13/2023    ECHOCARDIOGRAM REPORT   Patient Name:   Donald Molina Date of Exam: 07/13/2023 Medical Rec #:  [id number]       Height:       71.0 in Accession #:    [id number]      Weight:       172.0 lb Date of Birth:  Apr 15, 1951       BSA:          1.978 m Patient Age:    71 years        BP:           114/89 mmHg Patient Gender: M               HR:           77 bpm. Exam Location:  [facility] Procedure: 2D Echo, Cardiac Doppler, Color Doppler and Strain Analysis Indications:     CHF-Acute Diastolic I50.31  History:         Patient has no prior history of Echocardiogram examinations.                  CHF and Cardiomyopathy; Risk Factors:Hypertension.  Sonographer:     Neysa Bonito Roar Referring Phys:  [id number] Sondra Barges Diagnosing Phys: Armanda Magic MD  Sonographer Comments: Global longitudinal strain was attempted.  IMPRESSIONS  1. Left ventricular ejection fraction, by estimation, is 25 to 30%. The left ventricle has severely decreased function. The left ventricle demonstrates global hypokinesis. There is mild left ventricular hypertrophy of the basal-septal segment. Left ventricular diastolic function could not be evaluated. The average left ventricular global longitudinal strain is -9.3 %. The global longitudinal strain is abnormal with apical sparing. Consider cardiac amyloidosis.  2. Right ventricular systolic function is severely reduced. The right ventricular size is mildly enlarged. There is moderately elevated pulmonary artery systolic pressure. The estimated right ventricular systolic pressure is 57.5 mmHg.  3. Left atrial size was severely dilated.  4. The mitral valve is normal in structure.  Moderate mitral valve regurgitation. No evidence of mitral stenosis.  5. Tricuspid valve regurgitation is moderate.  6. The aortic valve is tricuspid. Aortic valve regurgitation is trivial. Aortic valve sclerosis/calcification is present, without any evidence of aortic stenosis. Aortic valve area, by VTI measures 2.69 cm. Aortic valve mean gradient measures 1.0 mmHg.  Aortic valve Vmax measures 0.76 m/s.  7. Pulmonic valve regurgitation is moderate.  8. Aortic dilatation noted. There is mild dilatation of the ascending aorta, measuring 39 mm.  9. The inferior vena cava is dilated in size with <50% respiratory variability, suggesting right atrial pressure of 15 mmHg. FINDINGS  Left Ventricle: Left ventricular ejection fraction, by estimation, is 25 to 30%. The left ventricle has severely decreased function. The left ventricle demonstrates global hypokinesis. The average left ventricular global longitudinal strain is -9.3 %. The global longitudinal strain is abnormal. The left ventricular internal cavity size was normal in size. There is mild left ventricular hypertrophy of the basal-septal segment. Left ventricular diastolic  function could not be evaluated due to atrial fibrillation. Left ventricular diastolic function could not be evaluated. Right Ventricle: The right ventricular size is mildly enlarged. No increase in right ventricular wall thickness. Right ventricular systolic function is severely reduced. There is moderately elevated pulmonary artery systolic pressure. The tricuspid regurgitant velocity is 3.26 m/s, and with an assumed right atrial pressure of 15 mmHg, the estimated right ventricular systolic pressure is 57.5 mmHg. Left Atrium: Left atrial size was severely dilated. Right Atrium: Right atrial size was normal in size. Pericardium: There is no evidence of pericardial effusion. Mitral Valve: The mitral valve is normal in structure. Moderate mitral valve regurgitation. No evidence of mitral valve stenosis. MV peak gradient, 2.4 mmHg. The mean mitral valve gradient is 1.0 mmHg. Tricuspid Valve: The tricuspid valve is normal in structure. Tricuspid valve regurgitation is moderate . No evidence of tricuspid stenosis. Aortic Valve: The aortic valve is tricuspid. Aortic valve regurgitation is trivial. Aortic valve sclerosis/calcification is present, without any evidence of aortic stenosis. Aortic valve mean gradient measures 1.0 mmHg. Aortic valve peak gradient measures 2.3 mmHg. Aortic valve area, by VTI measures 2.69 cm. Pulmonic Valve: The pulmonic valve was normal in structure. Pulmonic valve regurgitation is moderate. No evidence of pulmonic stenosis. Aorta: Aortic dilatation noted. There is mild dilatation of the ascending aorta, measuring 39 mm. Venous: The inferior vena cava is dilated in size with less than 50% respiratory variability, suggesting right atrial pressure of 15 mmHg. IAS/Shunts: No atrial level shunt detected by color flow Doppler.  LEFT VENTRICLE PLAX 2D LVIDd:         5.00 cm      Diastology LVIDs:         4.30 cm      LV e' medial:    4.79 cm/s LV PW:         0.80 cm      LV E/e' medial:  14.4 LV  IVS:        1.30 cm      LV e' lateral:   8.16 cm/s LVOT diam:     2.20 cm      LV E/e' lateral: 8.5 LV SV:         26 LV SV Index:   13           2D Longitudinal Strain LVOT Area:     3.80 cm     2D Strain GLS (A2C):   -9.2 %  2D Strain GLS (A3C):   -8.9 %                             2D Strain GLS (A4C):   -9.8 % LV Volumes (MOD)            2D Strain GLS Avg:     -9.3 % LV vol d, MOD A2C: 146.0 ml LV vol d, MOD A4C: 140.0 ml LV vol s, MOD A2C: 115.0 ml LV vol s, MOD A4C: 98.9 ml LV SV MOD A2C:     31.0 ml LV SV MOD A4C:     140.0 ml LV SV MOD BP:      37.5 ml RIGHT VENTRICLE RV Basal diam:  4.00 cm RV Mid diam:    3.50 cm RV S prime:     8.92 cm/s TAPSE (M-mode): 1.0 cm LEFT ATRIUM              Index        RIGHT ATRIUM           Index LA diam:        4.50 cm  2.28 cm/m   RA Area:     16.40 cm LA Vol (A2C):   70.9 ml  35.85 ml/m  RA Volume:   37.70 ml  19.06 ml/m LA Vol (A4C):   133.0 ml 67.25 ml/m LA Biplane Vol: 102.0 ml 51.58 ml/m  AORTIC VALVE                    PULMONIC VALVE AV Area (Vmax):    2.06 cm     PV Vmax:          0.66 m/s AV Area (Vmean):   2.18 cm     PV Peak grad:     1.7 mmHg AV Area (VTI):     2.69 cm     PR End Diast Vel: 23.81 msec AV Vmax:           76.40 cm/s   RVOT Peak grad:   0 mmHg AV Vmean:          49.500 cm/s AV VTI:            0.095 m AV Peak Grad:      2.3 mmHg AV Mean Grad:      1.0 mmHg LVOT Vmax:         41.50 cm/s LVOT Vmean:        28.400 cm/s LVOT VTI:          0.067 m LVOT/AV VTI ratio: 0.71  AORTA Ao Root diam: 3.20 cm Ao Asc diam:  3.90 cm MITRAL VALVE               TRICUSPID VALVE MV Area (PHT): 6.22 cm    TR Peak grad:   42.5 mmHg MV Area VTI:   1.85 cm    TR Vmax:        326.00 cm/s MV Peak grad:  2.4 mmHg MV Mean grad:  1.0 mmHg    SHUNTS MV Vmax:       0.78 m/s    Systemic VTI:  0.07 m MV Vmean:      46.2 cm/s   Systemic Diam: 2.20 cm MV Decel Time: 122 msec MV E velocity: 69.20 cm/s Armanda Magic MD Electronically signed by Armanda Magic MD Signature Date/Time: 07/13/2023/5:49:37 PM    Final    DG Chest  Port 1 View  Result Date: 07/13/2023 CLINICAL DATA:  Shortness of breath for several days. Worse while ambulating and lying flat. Lower abdominal pain. History of CHF. EXAM: PORTABLE CHEST 1 VIEW COMPARISON:  04/06/2022 FINDINGS: Cardiac enlargement, stable. No signs of pleural effusion. Increased pulmonary vascular congestion. No airspace consolidation. The visualized osseous structures are unremarkable. IMPRESSION: Cardiac enlargement and increased pulmonary vascular congestion. Electronically Signed   By: Signa Kell M.D.   On: 07/13/2023 09:23    Labs: Basic Metabolic Panel: Recent Labs  Lab 07/13/23 0821 07/14/23 0450 07/14/23 0455 07/15/23 0443  NA 137  --  137 136  K 4.0  --  3.6 3.2*  CL 104  --  103 102  CO2 23  --  23 25  GLUCOSE 110*  --  97 131*  BUN 12  --  23 30*  CREATININE 0.90  --  1.03 1.00  CALCIUM 9.1  --  9.0 8.9  MG  --  1.7  --   --    CBC: Recent Labs  Lab 07/13/23 0821 07/15/23 0443  WBC 7.4 6.1  HGB 14.8 14.0  HCT 39.5 37.8*  MCV 86.8 87.5  PLT 176 178   Microbiology: Results for orders placed or performed during the hospital encounter of 07/13/23  SARS Coronavirus 2 by RT PCR (hospital order, performed in [facility] hospital lab) *cepheid single result test* Anterior Nasal Swab     Status: Abnormal   Collection Time: 07/13/23  6:14 PM   Specimen: Anterior Nasal Swab  Result Value Ref Range Status   SARS Coronavirus 2 by RT PCR POSITIVE (A) NEGATIVE Final    Comment: (NOTE) SARS-CoV-2 target nucleic acids are DETECTED  SARS-CoV-2 RNA is generally detectable in upper respiratory specimens  during the acute phase of infection.  Positive results are indicative  of the presence of the identified virus, but do not rule out bacterial infection or co-infection with other pathogens not detected by the test.  Clinical correlation with patient history and  other diagnostic  information is necessary to determine patient infection status.  The expected result is negative.  Fact Sheet for Patients:   RoadLapTop.co.za   Fact Sheet for Healthcare Providers:   http://kim-miller.com/    This test is not yet approved or cleared by the [place] FDA and  has been authorized for detection and/or diagnosis of SARS-CoV-2 by FDA under an Emergency Use Authorization (EUA).  This EUA will remain in effect (meaning this test can be used) for the duration of  the COVID-19 declaration under Section 564(b)(1)  of the Act, 21 U.S.C. section 360-bbb-3(b)(1), unless the authorization is terminated or revoked sooner.   Performed at [facility], [address], [place], [place] [address]    Time coordinating discharge: Over 30 minutes  Leeroy Bock, MD  [facility] 07/15/2023, 4:01 PM

## 2023-07-15 NOTE — Plan of Care (Signed)
  Problem: Education: Goal: Knowledge of General Education information will improve Description: Including pain rating scale, medication(s)/side effects and non-pharmacologic comfort measures 07/15/2023 1647 by Lars Pinks, RN Outcome: Adequate for Discharge 07/15/2023 1549 by Lars Pinks, RN Outcome: Progressing   Problem: Health Behavior/Discharge Planning: Goal: Ability to manage health-related needs will improve 07/15/2023 1647 by Lars Pinks, RN Outcome: Adequate for Discharge 07/15/2023 1549 by Lars Pinks, RN Outcome: Progressing   Problem: Clinical Measurements: Goal: Ability to maintain clinical measurements within normal limits will improve 07/15/2023 1647 by Lars Pinks, RN Outcome: Adequate for Discharge 07/15/2023 1549 by Lars Pinks, RN Outcome: Progressing Goal: Will remain free from infection 07/15/2023 1647 by Lars Pinks, RN Outcome: Adequate for Discharge 07/15/2023 1549 by Lars Pinks, RN Outcome: Progressing Goal: Diagnostic test results will improve 07/15/2023 1647 by Lars Pinks, RN Outcome: Adequate for Discharge 07/15/2023 1549 by Lars Pinks, RN Outcome: Progressing Goal: Respiratory complications will improve 07/15/2023 1647 by Lars Pinks, RN Outcome: Adequate for Discharge 07/15/2023 1549 by Lars Pinks, RN Outcome: Progressing Goal: Cardiovascular complication will be avoided 07/15/2023 1647 by Lars Pinks, RN Outcome: Adequate for Discharge 07/15/2023 1549 by Lars Pinks, RN Outcome: Progressing   Problem: Activity: Goal: Risk for activity intolerance will decrease 07/15/2023 1647 by Lars Pinks, RN Outcome: Adequate for Discharge 07/15/2023 1549 by Lars Pinks, RN Outcome: Progressing   Problem: Nutrition: Goal: Adequate nutrition will be maintained 07/15/2023 1647 by Lars Pinks, RN Outcome: Adequate for Discharge 07/15/2023 1549 by Lars Pinks, RN Outcome:  Progressing   Problem: Coping: Goal: Level of anxiety will decrease 07/15/2023 1647 by Lars Pinks, RN Outcome: Adequate for Discharge 07/15/2023 1549 by Lars Pinks, RN Outcome: Progressing   Problem: Elimination: Goal: Will not experience complications related to bowel motility 07/15/2023 1647 by Lars Pinks, RN Outcome: Adequate for Discharge 07/15/2023 1549 by Lars Pinks, RN Outcome: Progressing Goal: Will not experience complications related to urinary retention 07/15/2023 1647 by Lars Pinks, RN Outcome: Adequate for Discharge 07/15/2023 1549 by Lars Pinks, RN Outcome: Progressing   Problem: Pain Managment: Goal: General experience of comfort will improve 07/15/2023 1647 by Lars Pinks, RN Outcome: Adequate for Discharge 07/15/2023 1549 by Lars Pinks, RN Outcome: Progressing   Problem: Safety: Goal: Ability to remain free from injury will improve 07/15/2023 1647 by Lars Pinks, RN Outcome: Adequate for Discharge 07/15/2023 1549 by Lars Pinks, RN Outcome: Progressing   Problem: Skin Integrity: Goal: Risk for impaired skin integrity will decrease 07/15/2023 1647 by Lars Pinks, RN Outcome: Adequate for Discharge 07/15/2023 1549 by Lars Pinks, RN Outcome: Progressing

## 2023-07-15 NOTE — Progress Notes (Signed)
Transition of Care [facility]) - Inpatient Brief Assessment   Patient Details  Name: Donald Molina MRN: [id number] Date of Birth: [birth year]  Transition of [facility]) CM/SW Contact:    Darolyn Rua, LCSW Phone Number: 07/15/2023, 11:46 AM   Clinical Narrative:  Patient from home presented to ED with SOB and abdominal pain.   Patient hx of being set up with [facility] for PCP needs last admission, patient also has hx of [facility] services if he needs HH at time of discharge.   TOC will follow for discharge planning needs.    Transition of Care Asessment:   Patient has primary care physician: No (was set up with [facility] last admission, will need to follow up with them) Home environment has been reviewed: from home Prior level of function:: independent Prior/Current Home Services: No current home services Social Determinants of Health Reivew: SDOH reviewed no interventions necessary Readmission risk has been reviewed: Yes Transition of care needs: no transition of care needs at this time

## 2023-07-15 NOTE — Progress Notes (Signed)
Rounding Note    Patient Name: Donald Molina Date of Encounter: 07/15/2023  [facility] Cardiologist: Lorine Bears, MD   Subjective   The patient is overall feeling better. Breathing is good. UOP - . No chest pain reported. In afib/flutter with rates 90-100s.  Inpatient Medications    Scheduled Meds:  apixaban  5 mg Oral BID   furosemide  40 mg Oral BID   losartan  25 mg Oral Daily   metoprolol tartrate  50 mg Oral BID   multivitamin  1 tablet Oral Daily   potassium chloride SA  20 mEq Oral Daily   zinc sulfate  220 mg Oral Daily   Continuous Infusions:  PRN Meds: metoprolol tartrate   Vital Signs    Vitals:   07/14/23 2328 07/15/23 0322 07/15/23 0914 07/15/23 1211  BP: (!) 87/71 103/67 105/79 99/78  Pulse: 96 (!) 54 92 (!) 58  Resp: 18 16 (!) 22 (!) 27  Temp: 98.1 F (36.7 C) 98 F (36.7 C) 98.2 F (36.8 C) 98 F (36.7 C)  TempSrc:   Oral Oral  SpO2: 97% 99% 100% 97%  Weight:      Height:        Intake/Output Summary (Last 24 hours) at 07/15/2023 1349 Last data filed at 07/15/2023 0600 Gross per 24 hour  Intake 480 ml  Output 500 ml  Net -20 ml      07/14/2023    5:00 AM 07/13/2023    5:19 PM 07/13/2023    8:13 AM  Last 3 Weights  Weight (lbs) 168 lb 14 oz 166 lb 9.6 oz 172 lb  Weight (kg) 76.6 kg 75.569 kg 78.019 kg      Telemetry    Afib/flutter HR 90-10) - Personally Reviewed  ECG    No new - Personally Reviewed  Physical Exam   GEN: No acute distress.   Neck: No JVD Cardiac: Irreg Irreg, no murmurs, rubs, or gallops.  Respiratory: Clear to auscultation bilaterally. GI: Soft, nontender, non-distended  MS: No edema; No deformity. Neuro:  Nonfocal  Psych: Normal affect   Labs    High Sensitivity Troponin:   Recent Labs  Lab 07/13/23 0821 07/13/23 1130  TROPONINIHS 21* 20*     Chemistry Recent Labs  Lab 07/13/23 0821 07/14/23 0450 07/14/23 0455 07/15/23 0443  NA 137  --  137 136  K 4.0  --  3.6 3.2*   CL 104  --  103 102  CO2 23  --  23 25  GLUCOSE 110*  --  97 131*  BUN 12  --  23 30*  CREATININE 0.90  --  1.03 1.00  CALCIUM 9.1  --  9.0 8.9  MG  --  1.7  --   --   PROT 8.5*  --   --  7.6  ALBUMIN 4.5  --   --  3.8  AST 48*  --   --  27  ALT 22  --   --  16  ALKPHOS 56  --   --  53  BILITOT 2.2*  --   --  1.3*  GFRNONAA >60  --  >60 >60  ANIONGAP 10  --  11 9    Lipids No results for input(s): "CHOL", "TRIG", "HDL", "LABVLDL", "LDLCALC", "CHOLHDL" in the last 168 hours.  Hematology Recent Labs  Lab 07/13/23 0821 07/15/23 0443  WBC 7.4 6.1  RBC 4.55 4.32  HGB 14.8 14.0  HCT 39.5 37.8*  MCV 86.8 87.5  MCH 32.5 32.4  MCHC 37.5* 37.0*  RDW 12.3 12.2  PLT 176 178   Thyroid  Recent Labs  Lab 07/13/23 0821  TSH 2.199    BNP Recent Labs  Lab 07/13/23 0821 07/14/23 0455  BNP 1,668.8* 1,561.0*    DDimer No results for input(s): "DDIMER" in the last 168 hours.   Radiology    DG Chest 1 View  Result Date: 07/14/2023 CLINICAL DATA:  CHF EXAM: CHEST  1 VIEW COMPARISON:  Yesterday FINDINGS: Chronic cardiomegaly accentuated by rotation. Congested appearance of vessels. No Kerley lines. No pleural effusion or pneumothorax. No consolidation. IMPRESSION: Chronic cardiomegaly and vascular congestion. Electronically Signed   By: Tiburcio Pea M.D.   On: 07/14/2023 07:09   ECHOCARDIOGRAM COMPLETE  Result Date: 07/13/2023    ECHOCARDIOGRAM REPORT   Patient Name:   Donald Molina Date of Exam: 07/13/2023 Medical Rec #:  [id number]       Height:       71.0 in Accession #:    [id number]      Weight:       172.0 lb Date of Birth:  [birth year]       BSA:          1.978 m Patient Age:    72 years        BP:           114/89 mmHg Patient Gender: M               HR:           77 bpm. Exam Location:  [facility] Procedure: 2D Echo, Cardiac Doppler, Color Doppler and Strain Analysis Indications:     CHF-Acute Diastolic I50.31  History:         Patient has no prior history of Echocardiogram  examinations.                  CHF and Cardiomyopathy; Risk Factors:Hypertension.  Sonographer:     Neysa Bonito Roar Referring Phys:  [id number] Sondra Barges Diagnosing Phys: Armanda Magic MD  Sonographer Comments: Global longitudinal strain was attempted. IMPRESSIONS  1. Left ventricular ejection fraction, by estimation, is 25 to 30%. The left ventricle has severely decreased function. The left ventricle demonstrates global hypokinesis. There is mild left ventricular hypertrophy of the basal-septal segment. Left ventricular diastolic function could not be evaluated. The average left ventricular global longitudinal strain is -9.3 %. The global longitudinal strain is abnormal with apical sparing. Consider cardiac amyloidosis.  2. Right ventricular systolic function is severely reduced. The right ventricular size is mildly enlarged. There is moderately elevated pulmonary artery systolic pressure. The estimated right ventricular systolic pressure is 57.5 mmHg.  3. Left atrial size was severely dilated.  4. The mitral valve is normal in structure. Moderate mitral valve regurgitation. No evidence of mitral stenosis.  5. Tricuspid valve regurgitation is moderate.  6. The aortic valve is tricuspid. Aortic valve regurgitation is trivial. Aortic valve sclerosis/calcification is present, without any evidence of aortic stenosis. Aortic valve area, by VTI measures 2.69 cm. Aortic valve mean gradient measures 1.0 mmHg.  Aortic valve Vmax measures 0.76 m/s.  7. Pulmonic valve regurgitation is moderate.  8. Aortic dilatation noted. There is mild dilatation of the ascending aorta, measuring 39 mm.  9. The inferior vena cava is dilated in size with <50% respiratory variability, suggesting right atrial pressure of 15 mmHg. FINDINGS  Left Ventricle: Left ventricular ejection fraction, by estimation, is 25 to 30%. The left ventricle has severely  decreased function. The left ventricle demonstrates global hypokinesis. The average left ventricular  global longitudinal strain is -9.3 %. The global longitudinal strain is abnormal. The left ventricular internal cavity size was normal in size. There is mild left ventricular hypertrophy of the basal-septal segment. Left ventricular diastolic function could not be evaluated due to atrial fibrillation. Left ventricular diastolic function could not be evaluated. Right Ventricle: The right ventricular size is mildly enlarged. No increase in right ventricular wall thickness. Right ventricular systolic function is severely reduced. There is moderately elevated pulmonary artery systolic pressure. The tricuspid regurgitant velocity is 3.26 m/s, and with an assumed right atrial pressure of 15 mmHg, the estimated right ventricular systolic pressure is 57.5 mmHg. Left Atrium: Left atrial size was severely dilated. Right Atrium: Right atrial size was normal in size. Pericardium: There is no evidence of pericardial effusion. Mitral Valve: The mitral valve is normal in structure. Moderate mitral valve regurgitation. No evidence of mitral valve stenosis. MV peak gradient, 2.4 mmHg. The mean mitral valve gradient is 1.0 mmHg. Tricuspid Valve: The tricuspid valve is normal in structure. Tricuspid valve regurgitation is moderate . No evidence of tricuspid stenosis. Aortic Valve: The aortic valve is tricuspid. Aortic valve regurgitation is trivial. Aortic valve sclerosis/calcification is present, without any evidence of aortic stenosis. Aortic valve mean gradient measures 1.0 mmHg. Aortic valve peak gradient measures 2.3 mmHg. Aortic valve area, by VTI measures 2.69 cm. Pulmonic Valve: The pulmonic valve was normal in structure. Pulmonic valve regurgitation is moderate. No evidence of pulmonic stenosis. Aorta: Aortic dilatation noted. There is mild dilatation of the ascending aorta, measuring 39 mm. Venous: The inferior vena cava is dilated in size with less than 50% respiratory variability, suggesting right atrial pressure of 15  mmHg. IAS/Shunts: No atrial level shunt detected by color flow Doppler.  LEFT VENTRICLE PLAX 2D LVIDd:         5.00 cm      Diastology LVIDs:         4.30 cm      LV e' medial:    4.79 cm/s LV PW:         0.80 cm      LV E/e' medial:  14.4 LV IVS:        1.30 cm      LV e' lateral:   8.16 cm/s LVOT diam:     2.20 cm      LV E/e' lateral: 8.5 LV SV:         26 LV SV Index:   13           2D Longitudinal Strain LVOT Area:     3.80 cm     2D Strain GLS (A2C):   -9.2 %                             2D Strain GLS (A3C):   -8.9 %                             2D Strain GLS (A4C):   -9.8 % LV Volumes (MOD)            2D Strain GLS Avg:     -9.3 % LV vol d, MOD A2C: 146.0 ml LV vol d, MOD A4C: 140.0 ml LV vol s, MOD A2C: 115.0 ml LV vol s, MOD A4C: 98.9 ml LV SV MOD A2C:  31.0 ml LV SV MOD A4C:     140.0 ml LV SV MOD BP:      37.5 ml RIGHT VENTRICLE RV Basal diam:  4.00 cm RV Mid diam:    3.50 cm RV S prime:     8.92 cm/s TAPSE (M-mode): 1.0 cm LEFT ATRIUM              Index        RIGHT ATRIUM           Index LA diam:        4.50 cm  2.28 cm/m   RA Area:     16.40 cm LA Vol (A2C):   70.9 ml  35.85 ml/m  RA Volume:   37.70 ml  19.06 ml/m LA Vol (A4C):   133.0 ml 67.25 ml/m LA Biplane Vol: 102.0 ml 51.58 ml/m  AORTIC VALVE                    PULMONIC VALVE AV Area (Vmax):    2.06 cm     PV Vmax:          0.66 m/s AV Area (Vmean):   2.18 cm     PV Peak grad:     1.7 mmHg AV Area (VTI):     2.69 cm     PR End Diast Vel: 23.81 msec AV Vmax:           76.40 cm/s   RVOT Peak grad:   0 mmHg AV Vmean:          49.500 cm/s AV VTI:            0.095 m AV Peak Grad:      2.3 mmHg AV Mean Grad:      1.0 mmHg LVOT Vmax:         41.50 cm/s LVOT Vmean:        28.400 cm/s LVOT VTI:          0.067 m LVOT/AV VTI ratio: 0.71  AORTA Ao Root diam: 3.20 cm Ao Asc diam:  3.90 cm MITRAL VALVE               TRICUSPID VALVE MV Area (PHT): 6.22 cm    TR Peak grad:   42.5 mmHg MV Area VTI:   1.85 cm    TR Vmax:        326.00 cm/s MV Peak  grad:  2.4 mmHg MV Mean grad:  1.0 mmHg    SHUNTS MV Vmax:       0.78 m/s    Systemic VTI:  0.07 m MV Vmean:      46.2 cm/s   Systemic Diam: 2.20 cm MV Decel Time: 122 msec MV E velocity: 69.20 cm/s Armanda Magic MD Electronically signed by Armanda Magic MD Signature Date/Time: 07/13/2023/5:49:37 PM    Final     Cardiac Studies   Echo 06/2023 1. Left ventricular ejection fraction, by estimation, is 25 to 30%. The  left ventricle has severely decreased function. The left ventricle  demonstrates global hypokinesis. There is mild left ventricular  hypertrophy of the basal-septal segment. Left  ventricular diastolic function could not be evaluated. The average left  ventricular global longitudinal strain is -9.3 %. The global longitudinal  strain is abnormal with apical sparing. Consider cardiac amyloidosis.   2. Right ventricular systolic function is severely reduced. The right  ventricular size is mildly enlarged. There is moderately elevated  pulmonary artery systolic pressure. The estimated  right ventricular  systolic pressure is 57.5 mmHg.   3. Left atrial size was severely dilated.   4. The mitral valve is normal in structure. Moderate mitral valve  regurgitation. No evidence of mitral stenosis.   5. Tricuspid valve regurgitation is moderate.   6. The aortic valve is tricuspid. Aortic valve regurgitation is trivial.  Aortic valve sclerosis/calcification is present, without any evidence of  aortic stenosis. Aortic valve area, by VTI measures 2.69 cm. Aortic valve  mean gradient measures 1.0 mmHg.   Aortic valve Vmax measures 0.76 m/s.   7. Pulmonic valve regurgitation is moderate.   8. Aortic dilatation noted. There is mild dilatation of the ascending  aorta, measuring 39 mm.   9. The inferior vena cava is dilated in size with <50% respiratory  variability, suggesting right atrial pressure of 15 mmHg.    Echo 07/2022 1. Left ventricular ejection fraction, by estimation, is 50 to 55%.  The  left ventricle has low normal function. The left ventricle has no regional  wall motion abnormalities. Left ventricular diastolic parameters are  consistent with Grade I diastolic  dysfunction (impaired relaxation). The average left ventricular global  longitudinal strain is -15.3 %.   2. Right ventricular systolic function is normal. The right ventricular  size is normal. Tricuspid regurgitation signal is inadequate for assessing  PA pressure.   3. The mitral valve is normal in structure. No evidence of mitral valve  regurgitation. No evidence of mitral stenosis.   4. The aortic valve is tricuspid. Aortic valve regurgitation is not  visualized. No aortic stenosis is present.   5. The inferior vena cava is normal in size with greater than 50%  respiratory variability, suggesting right atrial pressure of 3 mmHg.   6. bradycardia noted, rate 44 bpm   Comparison(s): Previous LVEF was reported as 20-25%.    Echo 03/2022 1. Left ventricular ejection fraction, by estimation, is 25%. The left  ventricle has severely decreased function. The left ventricle demonstrates  global hypokinesis. There is mild left ventricular hypertrophy. Left  ventricular diastolic parameters are  indeterminate.   2. Right ventricular systolic function is moderately reduced. The right  ventricular size is normal. Mildly increased right ventricular wall  thickness. There is severely elevated pulmonary artery systolic pressure.  The estimated right ventricular  systolic pressure is 64.0 mmHg.   3. Left atrial size was moderately dilated.   4. The mitral valve is normal in structure. Moderate to severe mitral  valve regurgitation. No evidence of mitral stenosis.   5. Tricuspid valve regurgitation is moderate.   6. The aortic valve is tricuspid. Aortic valve regurgitation is not  visualized. No aortic stenosis is present.   7. The inferior vena cava is dilated in size with <50% respiratory  variability,  suggesting right atrial pressure of 15 mmHg.   Echo 2020 1. The left ventricle has moderate-severely reduced systolic function,  with an ejection fraction of 30-35%. The cavity size was mild to  moderately dilated. Findings are consistent with dilated cardiomyopathy.  Left ventricular diastolic parameters were  normal. Left ventricular diffuse hypokinesis.   2. The right ventricle has moderately reduced systolic function. The  cavity was moderately enlarged. There is no increase in right ventricular  wall thickness.   3. Left atrial size was mildly dilated.   4. Right atrial size was mildly dilated.   5. The mitral valve is grossly normal. Mild thickening of the mitral  valve leaflet. Mitral valve regurgitation is mild to moderate  by color  flow Doppler.   6. The aortic valve is grossly normal.   7. Mild valvular pulmonic stenosis.   8. The aorta is normal in size and structure.   9. The interatrial septum was not assessed.   R/L heart cath 2019 1.  Normal coronary arteries. 2.  Severely reduced LV systolic function by echo.  Left ventricular angiography was not performed. 3.  Right heart catheterization showed severely elevated filling pressures, moderate pulmonary hypertension and severely reduced cardiac output.  Cardiac output was 2.98 with a cardiac index of 1.47.   Recommendations: The patient has nonischemic cardiomyopathy likely tachycardia induced.  He continues to be significantly volume overloaded and I recommend continuing IV diuresis. Resume heparin drip 8 hours after sheath pull.  A DOAC can be started tomorrow.  I recommend TEE guided cardioversion before hospital discharge given degree of cardiomyopathy.  This can likely be done on Wednesday once volume overload improves.  I am going to start the patient on oral amiodarone to facilitate cardioversion.   Patient Profile     72 y.o. male with a hx of normal coronary arteries by LHC in 04/2018, HFrEF secondary to NICM,  persistent A-fib status post DCCV in 04/2022, HTN, and medication noncompliance leading to recurrent hospital admissions who is being seen today for the evaluation of Afib with RVR   Assessment & Plan    Aflutter with RVR - multiple admits for recurrent Afib in the setting of noncompliance found to have reduced EF - started on IV Cardizem drip with improvement of heart rate - no missed doses of Eliquis -  normal TSH - Echo showed reduced EF 25-30%, global HK, severe LAE, moderate MR and TR -  IV dilt stopped due to low EF - continue Eliquis 5mg  BID - Amio stopped in the past after DCCV due to bradycardia - plan for EP to see tomorrow  Acute HFimpEF NICM - Echo this admission showed LVEF 25-30%, consider cardiac amyloidosis - cath in 2019 with normal cors - missed a dose of lasix a week ago - aflutter likely contributing to acute CHF - IV lasix 40mg  BID - BNP 1561 - Net -1.5L - continue Lopressor 50mg  BID and Losartan 25mg  daily - BP soft at times, limiting GDTM. Continue with diuresis. Consider advanced CHF consult. Also, may need cMRI as outpatient.  Elevated troponin - troponin minimally elevated - normal cors by cath in 2019 - no further ischemic work-up  HTN - BP soft at times   For questions or updates, please contact [facility] Please consult [email or web address] for contact info under     Signed, Lamae Fosco David Stall, PA-C  07/15/2023, 1:49 PM

## 2023-07-15 NOTE — TOC Benefit Eligibility Note (Signed)
Patient Product/process development scientist completed.    The patient is insured through [employer]. Patient has Medicare and is not eligible for a copay card, but may be able to apply for patient assistance, if available.    Ran test claim for Entresto 24-26 mg and the current 30 day co-pay is $0.00.  Ran test claim for Farxiga 10 mg and the current 30 day co-pay is $0.00.  Ran test claim for Jardiance 10 mg and the current 30 day co-pay is $0.00.   This test claim was processed through [facility]- copay amounts may vary at other pharmacies due to pharmacy/plan contracts, or as the patient moves through the different stages of their insurance plan.     Roland Earl, CPHT Pharmacy Technician III Certified Patient Advocate [facility] [place] [facility] Patient Advocate Team Direct Number: [phone]  Fax: [phone]

## 2023-07-17 ENCOUNTER — Telehealth: Payer: Self-pay | Admitting: Cardiovascular Disease

## 2023-07-17 ENCOUNTER — Other Ambulatory Visit: Payer: Self-pay | Admitting: Physician Assistant

## 2023-07-17 DIAGNOSIS — I4819 Other persistent atrial fibrillation: Secondary | ICD-10-CM

## 2023-07-17 MED ORDER — APIXABAN 5 MG PO TABS: 5.0000 mg | ORAL_TABLET | Freq: Two times a day (BID) | ORAL | 3 refills | Status: DC

## 2023-07-17 NOTE — Telephone Encounter (Signed)
Please review

## 2023-07-17 NOTE — Telephone Encounter (Signed)
Called to schedule appointments No answer, no voice mail

## 2023-07-17 NOTE — Telephone Encounter (Signed)
-----   Message from Cadence David Stall sent at 07/15/2023  4:01 PM EDT ----- Regarding: hosp follow-up Patient needs very close follow-up with EP this week. And with general cards in 1 month.

## 2023-07-17 NOTE — Telephone Encounter (Signed)
Prescription refill request for Eliquis received. Indication:afib Last office visit:10/23 Scr:1.00  8/24 Age: 73 Weight:76.6  kg  Prescription refilled

## 2023-07-18 NOTE — Progress Notes (Deleted)
PCP: Primary Cardiologist:  HPI:  [name] is a 72 y/o male with a history of normal coronary arteries by LHC in 04/2018, HFrEF secondary to NICM, persistent A-fib status post DCCV in 04/2022, HTN, and medication noncompliance leading to recurrent hospital admissions who presents for follow-up of his cardiomyopathy and A-fib.   He was admitted to the hospital in 04/2018 with A-fib and volume overload.  Echo demonstrated an EF of 25 to 30%.  Cardiac cath showed normal coronary arteries.  Following diuresis and amiodarone loading, he underwent successful DCCV.  He had recurrent A-fib following discharge in the setting of medication noncompliance with subsequent successful cardioversion thereafter following initiation of medications.  Follow-up echo in 06/2018 showed an improved LVEF of 45 to 50%.  In 06/2019, he was readmitted with dyspnea recurrent A-fib in the setting of medication noncompliance.  EF was again found to be reduced at 30 to 35% with diffuse hypokinesis.  He was diuresed and placed back on beta-blocker and apixaban with plan for rhythm management in the outpatient setting.  He was seen in the office in 07/2020 and was noted to be in sinus rhythm.  He had been lost to follow-up since then until he was admitted to the hospital in May of this year.   He was admitted to the hospital in 03/2022 with volume overload and recurrent A-fib in the context of medication noncompliance.  He reported having not filled his medications for at least a year.  High-sensitivity troponin peaked at 80 with subsequent downtrend.  BNP 1383.  Echo demonstrated an EF of 25%, global hypokinesis with LVH, indeterminate LV diastolic function parameters, moderately reduced RV systolic function with normal ventricular cavity size and mildly increased ventricular wall thickness, elevated PASP estimated at 64 mmHg, moderately dilated left atrium moderate to severe mitral valve regurgitation, moderate tricuspid regurgitation, and an  estimated right atrial pressure 15 mmHg.  He underwent diuresis and escalation of GDMT.  He subsequently underwent unusuccessful TEE guided DCCV with 2 shocks at 200 J.  Following this, it was recommended he be loaded with amiodarone with plans for repeat attempt of DCCV in the outpatient setting.  Admitted 07/13/23 due to        ROS: All systems negative except as listed in HPI, PMH and Problem List.  SH:  Social History   Socioeconomic History   Marital status: Divorced    Spouse name: Not on file   Number of children: 2   Years of education: 12   Highest education level: 12th grade  Occupational History   Occupation: retired  Tobacco Use   Smoking status: Former    Current packs/day: 0.00    Types: Cigarettes    Start date: 05/28/1988    Quit date: 05/28/1998    Years since quitting: 25.1   Smokeless tobacco: Former    Types: Chew    Quit date: 05/28/1998  Vaping Use   Vaping status: Never Used  Substance and Sexual Activity   Alcohol use: Yes    Alcohol/week: 12.0 standard drinks of alcohol    Types: 12 Cans of beer per week   Drug use: Never   Sexual activity: Yes    Birth control/protection: Condom  Other Topics Concern   Not on file  Social History Narrative   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   Social Determinants of Health   Financial Resource Strain: Low Risk  (08/12/2019)   Received [facility], [facility]  Overall Financial Resource Strain (CARDIA)    Difficulty of Paying Living Expenses: Not hard at all  Food Insecurity: No Food Insecurity (07/13/2023)   Hunger Vital Sign    Worried About Running Out of Food in the Last Year: Never true    Ran Out of Food in the Last Year: Never true  Transportation Needs: No Transportation Needs (07/13/2023)   PRAPARE - Administrator, Civil Service (Medical): No    Lack of Transportation (Non-Medical): No  Physical Activity: Insufficiently Active (05/28/2018)    Exercise Vital Sign    Days of Exercise per Week: 7 days    Minutes of Exercise per Session: 20 min  Stress: No Stress Concern Present (05/28/2018)   [employer] of Occupational Health - Occupational Stress Questionnaire    Feeling of Stress : Not at all  Social Connections: Not on file  Intimate Partner Violence: Not At Risk (07/13/2023)   Humiliation, Afraid, Rape, and Kick questionnaire    Fear of Current or Ex-Partner: No    Emotionally Abused: No    Physically Abused: No    Sexually Abused: No    FH:  Family History  Problem Relation Age of Onset   Cancer Mother        pancreatic   Cervical cancer Mother    Dementia Father    Heart failure Father    Heart disease Sister     Past Medical History:  Diagnosis Date   CHF (congestive heart failure) (HCC)    HFrEF (heart failure with reduced ejection fraction) (HCC)    a. 04/2018 Echo: EF 25-30%; b. 06/2018 Echo: EF 45-50%. diff HK, Gr1 DD. Nl RV fxn; c. 06/2019 Echo: EF 30-35%, diff HK, mod reduced RV fxn, mild BAE, mild-mod MR, mild PS.   Hypertension    NICM (nonischemic cardiomyopathy) (HCC)    a. 04/2018 Echo: EF 25-30%; b. 06/2018 Echo: EF 45-50%; c. 06/2019 Echo: EF 30-35%.   Persistent atrial fibrillation (HCC)    a. Dx 04/2018 s/p DCCV and amio loading. CHA2DS2VASc 3-->eliquis.    Current Outpatient Medications  Medication Sig Dispense Refill   apixaban (ELIQUIS) 5 MG TABS tablet Take 1 tablet (5 mg total) by mouth 2 (two) times daily. 180 tablet 3   furosemide (LASIX) 40 MG tablet Take 1 tablet (40 mg total) by mouth 2 (two) times daily. 180 tablet 3   losartan (COZAAR) 25 MG tablet Take 0.5 tablets (12.5 mg total) by mouth daily. 30 tablet 0   metoprolol tartrate (LOPRESSOR) 50 MG tablet Take 1 tablet (50 mg total) by mouth 2 (two) times daily. 60 tablet 0   potassium chloride SA (KLOR-CON M) 20 MEQ tablet Take 1 tablet (20 mEq total) by mouth daily. 90 tablet 3   zinc sulfate 220 (50 Zn) MG capsule Take 1 capsule  (220 mg total) by mouth daily. 45 capsule 0   No current facility-administered medications for this visit.    There were no vitals filed for this visit.  PHYSICAL EXAM:  General:  Well appearing. No resp difficulty HEENT: normal Neck: supple. JVP flat. Carotids 2+ bilaterally; no bruits. No lymphadenopathy or thryomegaly appreciated. Cor: PMI normal. Regular rate & rhythm. No rubs, gallops or murmurs. Lungs: clear Abdomen: soft, nontender, nondistended. No hepatosplenomegaly. No bruits or masses. Good bowel sounds. Extremities: no cyanosis, clubbing, rash, edema Neuro: alert & orientedx3, cranial nerves grossly intact. Moves all 4 extremities w/o difficulty. Affect pleasant.   ECG:   ASSESSMENT & PLAN:  1: Chronic heart failure with reduced ejection fraction- - NYHA class II - euvolemic today - weighing daily and he was reminded to call for an overnight weight gain of >2 pounds or a weekly weight gain of >5 pounds - weight stable from last time he was here ~ 1 month ago - not adding salt and trying to read food labels. Reminded to closely follow a 2000mg  diet  - has been walking ~ 20 minutes every morning - has become hypotensive with entresto in the past - unable to titrate metoprolol succinate due to history of bradycardia - will add farxiga 10mg  daily; instructed to stop taking his furosemide daily and to now take it as needed for above weight gain, swelling or shortness of breath - saw cardiology Brion Aliment) 08/06/18 - BNP 1701.0 on 07/03/2019  2: HTN- - BP on the low side today - seeing PCP @ [facility] & says that he has to make an appointment with them - BMP 07/06/2019 reviewed and showed sodium 138, potassium 3.7, creatinine 1.10 and GFR >60.   Medication bottles were reviewed.  Return in 2 weeks or sooner for any questions/problems before then.

## 2023-07-19 ENCOUNTER — Encounter: Payer: Medicare HMO | Admitting: Family

## 2023-07-19 ENCOUNTER — Telehealth: Payer: Self-pay | Admitting: Family

## 2023-07-19 NOTE — Telephone Encounter (Signed)
Patient did not show for his Heart Failure [facility] appointment on 07/19/23.

## 2023-07-23 NOTE — Telephone Encounter (Signed)
Called both numbers on file No answer, no VM and not in service

## 2023-08-12 ENCOUNTER — Other Ambulatory Visit: Payer: Self-pay | Admitting: Physician Assistant

## 2023-10-04 ENCOUNTER — Telehealth: Payer: Self-pay | Admitting: Physician Assistant

## 2023-10-04 NOTE — Telephone Encounter (Signed)
Pt said he don't want to be seen at this facility any more

## 2023-11-06 ENCOUNTER — Other Ambulatory Visit: Payer: Self-pay

## 2023-11-06 ENCOUNTER — Observation Stay
Admission: EM | Admit: 2023-11-06 | Discharge: 2023-11-07 | Disposition: A | Discharge status: Home / Self Care | Payer: Medicare HMO | Attending: Osteopathic Medicine | Admitting: Osteopathic Medicine

## 2023-11-06 ENCOUNTER — Emergency Department: Payer: Medicare HMO

## 2023-11-06 DIAGNOSIS — I5043 Acute on chronic combined systolic (congestive) and diastolic (congestive) heart failure: Secondary | ICD-10-CM | POA: Diagnosis not present

## 2023-11-06 DIAGNOSIS — I4819 Other persistent atrial fibrillation: Secondary | ICD-10-CM | POA: Diagnosis not present

## 2023-11-06 DIAGNOSIS — I4891 Unspecified atrial fibrillation: Principal | ICD-10-CM | POA: Diagnosis present

## 2023-11-06 DIAGNOSIS — M7989 Other specified soft tissue disorders: Secondary | ICD-10-CM | POA: Diagnosis not present

## 2023-11-06 DIAGNOSIS — I509 Heart failure, unspecified: Secondary | ICD-10-CM

## 2023-11-06 DIAGNOSIS — I428 Other cardiomyopathies: Secondary | ICD-10-CM

## 2023-11-06 DIAGNOSIS — I110 Hypertensive heart disease with heart failure: Secondary | ICD-10-CM | POA: Insufficient documentation

## 2023-11-06 DIAGNOSIS — Z87891 Personal history of nicotine dependence: Secondary | ICD-10-CM | POA: Insufficient documentation

## 2023-11-06 DIAGNOSIS — R0989 Other specified symptoms and signs involving the circulatory and respiratory systems: Secondary | ICD-10-CM | POA: Diagnosis not present

## 2023-11-06 DIAGNOSIS — R7989 Other specified abnormal findings of blood chemistry: Secondary | ICD-10-CM | POA: Diagnosis present

## 2023-11-06 DIAGNOSIS — I5023 Acute on chronic systolic (congestive) heart failure: Secondary | ICD-10-CM | POA: Diagnosis not present

## 2023-11-06 DIAGNOSIS — Z79899 Other long term (current) drug therapy: Secondary | ICD-10-CM | POA: Insufficient documentation

## 2023-11-06 DIAGNOSIS — Z7901 Long term (current) use of anticoagulants: Secondary | ICD-10-CM | POA: Insufficient documentation

## 2023-11-06 DIAGNOSIS — I10 Essential (primary) hypertension: Secondary | ICD-10-CM | POA: Diagnosis present

## 2023-11-06 DIAGNOSIS — R600 Localized edema: Secondary | ICD-10-CM

## 2023-11-06 LAB — BASIC METABOLIC PANEL
Anion gap: 11 (ref 5–15)
BUN: 16 mg/dL (ref 8–23)
CO2: 25 mmol/L (ref 22–32)
Calcium: 9 mg/dL (ref 8.9–10.3)
Chloride: 101 mmol/L (ref 98–111)
Creatinine, Ser: 0.93 mg/dL (ref 0.61–1.24)
GFR, Estimated: >60 mL/min (ref >60)
Glucose, Bld: 96 mg/dL (ref 70–99)
Potassium: 4.6 mmol/L (ref 3.5–5.1)
Sodium: 137 mmol/L (ref 135–145)

## 2023-11-06 LAB — CBC
HCT: 38.9 % — ABNORMAL LOW (ref 39.0–52.0)
Hemoglobin: 14.2 g/dL (ref 13.0–17.0)
MCH: 32 pg (ref 26.0–34.0)
MCHC: 36.5 g/dL — ABNORMAL HIGH (ref 30.0–36.0)
MCV: 87.6 fL (ref 80.0–100.0)
Platelets: 182 10*3/uL (ref 150–400)
RBC: 4.44 MIL/uL (ref 4.22–5.81)
RDW: 11.6 % (ref 11.5–15.5)
WBC: 7.6 10*3/uL (ref 4.0–10.5)
nRBC: 0 % (ref 0.0–0.2)

## 2023-11-06 LAB — MAGNESIUM: Magnesium: 1.8 mg/dL (ref 1.7–2.4)

## 2023-11-06 LAB — TROPONIN I (HIGH SENSITIVITY)
Troponin I (High Sensitivity): 25 ng/L — ABNORMAL HIGH (ref <18)
Troponin I (High Sensitivity): 26 ng/L — ABNORMAL HIGH (ref <18)

## 2023-11-06 LAB — BRAIN NATRIURETIC PEPTIDE: B Natriuretic Peptide: 1637 pg/mL — ABNORMAL HIGH (ref 0.0–100.0)

## 2023-11-06 MED ORDER — APIXABAN 5 MG PO TABS
5.0000 mg | ORAL_TABLET | Freq: Two times a day (BID) | ORAL | Status: DC
  Administered 2023-11-06 – 2023-11-07 (×2): 5 mg via ORAL
  Filled 2023-11-06 (×2): qty 1

## 2023-11-06 MED ORDER — METOPROLOL TARTRATE 50 MG PO TABS
50.0000 mg | ORAL_TABLET | Freq: Two times a day (BID) | ORAL | Status: DC
  Filled 2023-11-06: qty 1

## 2023-11-06 MED ORDER — METOPROLOL TARTRATE 50 MG PO TABS
50.0000 mg | ORAL_TABLET | Freq: Once | ORAL | Status: AC
  Administered 2023-11-06: 50 mg via ORAL
  Filled 2023-11-06: qty 1

## 2023-11-06 MED ORDER — MELATONIN 5 MG PO TABS: 5.0000 mg | ORAL_TABLET | Freq: Every evening | ORAL | Status: DC | PRN

## 2023-11-06 MED ORDER — METOPROLOL TARTRATE 50 MG PO TABS
50.0000 mg | ORAL_TABLET | Freq: Two times a day (BID) | ORAL | Status: DC
  Administered 2023-11-07: 50 mg via ORAL
  Filled 2023-11-06: qty 1

## 2023-11-06 MED ORDER — ONDANSETRON HCL 4 MG PO TABS: 4.0000 mg | ORAL_TABLET | Freq: Four times a day (QID) | ORAL | Status: DC | PRN

## 2023-11-06 MED ORDER — HYDRALAZINE HCL 20 MG/ML IJ SOLN: 5.0000 mg | Freq: Four times a day (QID) | INTRAMUSCULAR | Status: DC | PRN

## 2023-11-06 MED ORDER — DILTIAZEM HCL-DEXTROSE 125-5 MG/125ML-% IV SOLN (PREMIX)
5.0000 mg/h | INTRAVENOUS | Status: DC
  Administered 2023-11-06: 5 mg/h via INTRAVENOUS
  Filled 2023-11-06: qty 125

## 2023-11-06 MED ORDER — SALINE SPRAY 0.65 % NA SOLN: 1.0000 | NASAL | Status: DC | PRN

## 2023-11-06 MED ORDER — SENNOSIDES-DOCUSATE SODIUM 8.6-50 MG PO TABS: 1.0000 | ORAL_TABLET | Freq: Every evening | ORAL | Status: DC | PRN

## 2023-11-06 MED ORDER — FUROSEMIDE 10 MG/ML IJ SOLN
20.0000 mg | Freq: Two times a day (BID) | INTRAMUSCULAR | Status: DC
  Administered 2023-11-07: 20 mg via INTRAVENOUS
  Filled 2023-11-06: qty 4

## 2023-11-06 MED ORDER — ACETAMINOPHEN 325 MG PO TABS: 650.0000 mg | ORAL_TABLET | Freq: Four times a day (QID) | ORAL | Status: DC | PRN

## 2023-11-06 MED ORDER — ONDANSETRON HCL 4 MG/2ML IJ SOLN: 4.0000 mg | Freq: Four times a day (QID) | INTRAMUSCULAR | Status: DC | PRN

## 2023-11-06 MED ORDER — ACETAMINOPHEN 325 MG RE SUPP: 650.0000 mg | Freq: Four times a day (QID) | RECTAL | Status: DC | PRN

## 2023-11-06 MED ORDER — FUROSEMIDE 10 MG/ML IJ SOLN
20.0000 mg | Freq: Once | INTRAMUSCULAR | Status: AC
  Administered 2023-11-06: 20 mg via INTRAVENOUS
  Filled 2023-11-06: qty 4

## 2023-11-06 NOTE — Assessment & Plan Note (Addendum)
Suspect demand ischemia in setting of A-fib with RVR Treat per above

## 2023-11-06 NOTE — Assessment & Plan Note (Addendum)
Strict I's and O's Status post furosemide 20 mg IV one-time dose per EDP  I have ordered furosemide 20 mg IV twice daily, for 2 doses on 11/07/2023. Home metoprolol tartrate 50 mg p.o. twice daily resumed on admission Patient had Echo on 07/13/2023: With estimated ejection fraction of 25 to 30%, global hypokinesis.  New echo not ordered on admission as patient has been noncompliant with home heart failure medications  Patient missed home metoprolol due to running out of medications and pending refill.  Resumed home dosing

## 2023-11-06 NOTE — H&P (Addendum)
History and Physical   Donald Molina AVW:[id number] DOB: 02/26/1951 DOA: 11/06/2023  PCP: Pcp, No Outpatient Specialists: Dr. Kirke Corin, [facility] cardiology Patient coming from: Home  I have personally briefly reviewed patient's old medical records in [facility] EMR.  Chief Concern: Bilateral leg swelling  HPI: Mr. Donald Molina is a 72 year old male with medical history of nonischemic heart failure reduced ejection fraction, persistent atrial fibrillation status post DCCV in June 2023, hypertension, history of medication noncompliance leading to recurrent hospital admissions, who presents to the emergency department for chief concerns of bilateral lower extremity swelling.  He was found to be in atrial fibrillation with RVR.  Vitals in the ED showed temperature of 98.2, respiration rate of 24, heart rate 123, blood pressure 148/105, SpO2 of 94% on room air.  Serum sodium is 137, potassium 4.6, chloride 101, bicarb 25, BUN 16, serum creatinine of 0.93, EGFR greater than 60, nonfasting blood glucose 96, WBC 7.6, hemoglobin 14.2, platelets of 182.  BNP is 1637.  High sensitive troponin is 25.  ED treatment: Diltiazem gtt. initiated, furosemide 20 mg IV one-time dose -------------------------- At bedside, patient is awake to his name, age, current location and current year.  He denies trauma, shortness of breath, blood in his urine, diarrhea, syncope. He endorses compliance with medications except for his toprol.  He drinks about one 20 oz of bottle of water per day. He denies coffee, tea, juice, soups.  He endorses swelling of his legs for about two days. He reports running out of metoprolol for about 2 weeks.   He reports he did not run out of any of his other medications and he has been compliant with all the others.  Social history: He lives on his own. He denies smoking and recreational drug use. He last had an etoh drink two days ago, one 12 beers two days ago.    ROS: Constitutional: no weight change, no fever ENT/Mouth: no sore throat, no rhinorrhea Eyes: no eye pain, no vision changes Cardiovascular: no chest pain, no dyspnea, + edema, no palpitations Respiratory: no cough, no sputum, no wheezing Gastrointestinal: no nausea, no vomiting, no diarrhea, no constipation Genitourinary: no urinary incontinence, no dysuria, no hematuria Musculoskeletal: no arthralgias, no myalgias Skin: no skin lesions, no pruritus, Neuro: + weakness, no loss of consciousness, no syncope Psych: no anxiety, no depression, no decrease appetite Heme/Lymph: no bruising, no bleeding  ED Course: Discussed with the EDP, patient requiring hospitalization for chief concerns of atrial fibrillation with RVR.  Assessment/Plan  Principal Problem:   Acute exacerbation of CHF (congestive heart failure) (HCC) Active Problems:   HTN (hypertension)   Elevated troponin   A-fib (HCC)   NICM (nonischemic cardiomyopathy) (HCC)   Assessment and Plan:  * Acute exacerbation of CHF (congestive heart failure) (HCC) Strict I's and O's Status post furosemide 20 mg IV one-time dose per EDP  I have ordered furosemide 20 mg IV twice daily, for 2 doses on 11/07/2023. Home metoprolol tartrate 50 mg p.o. twice daily resumed on admission Patient had Echo on 07/13/2023: With estimated ejection fraction of 25 to 30%, global hypokinesis.  New echo not ordered on admission as patient has been noncompliant with home heart failure medications  Patient missed home metoprolol due to running out of medications and pending refill.  Resumed home dosing  HTN (hypertension) Home metoprolol tartrate 50 mg p.o. twice daily resumed on admission  A-fib (HCC) With RVR on admission Home Eliquis 5 mg p.o. twice daily and metoprolol tartrate  50 mg twice daily were resumed on admission Continue diltiazem GGT Resumed home metoprolol 50 mg p.o. twice daily, with first dose including now as patient has missed  metoprolol for approximately 2 weeks due to pending medication refill  Elevated troponin Suspect demand ischemia in setting of A-fib with RVR Treat per above  Chart reviewed.   DVT prophylaxis: Resumed home Eliquis Code Status: full code Diet: Healthy Family Communication: Updated sister, Donald Molina at bedside.   Disposition Plan: Clinical course; guarded prognosis Consults called: none Admission status: Telemetry cardiac, inpatient  Past Medical History:  Diagnosis Date   CHF (congestive heart failure) (HCC)    HFrEF (heart failure with reduced ejection fraction) (HCC)    a. 04/2018 Echo: EF 25-30%; b. 06/2018 Echo: EF 45-50%. diff HK, Gr1 DD. Nl RV fxn; c. 06/2019 Echo: EF 30-35%, diff HK, mod reduced RV fxn, mild BAE, mild-mod MR, mild PS.   Hypertension    NICM (nonischemic cardiomyopathy) (HCC)    a. 04/2018 Echo: EF 25-30%; b. 06/2018 Echo: EF 45-50%; c. 06/2019 Echo: EF 30-35%.   Persistent atrial fibrillation (HCC)    a. Dx 04/2018 s/p DCCV and amio loading. CHA2DS2VASc 3-->eliquis.   Past Surgical History:  Procedure Laterality Date   CARDIAC CATHETERIZATION     CARDIOVERSION N/A 04/09/2022   Procedure: CARDIOVERSION;  Surgeon: Debbe Odea, MD;  Location: [facility] ORS;  Service: Cardiovascular;  Laterality: N/A;   CARDIOVERSION N/A 05/17/2022   Procedure: CARDIOVERSION;  Surgeon: Antonieta Iba, MD;  Location: [facility] ORS;  Service: Cardiovascular;  Laterality: N/A;   RIGHT/LEFT HEART CATH AND CORONARY ANGIOGRAPHY N/A 05/05/2018   Procedure: RIGHT/LEFT HEART CATH AND CORONARY ANGIOGRAPHY;  Surgeon: Iran Ouch, MD;  Location: [facility] INVASIVE CV LAB;  Service: Cardiovascular;  Laterality: N/A;   TEE WITHOUT CARDIOVERSION N/A 05/07/2018   Procedure: TRANSESOPHAGEAL ECHOCARDIOGRAM (TEE);  Surgeon: Yvonne Kendall, MD;  Location: [facility] ORS;  Service: Cardiovascular;  Laterality: N/A;   TEE WITHOUT CARDIOVERSION N/A 04/09/2022   Procedure: TRANSESOPHAGEAL ECHOCARDIOGRAM (TEE);   Surgeon: Debbe Odea, MD;  Location: [facility] ORS;  Service: Cardiovascular;  Laterality: N/A;   Social History:  reports that he quit smoking about 25 years ago. His smoking use included cigarettes. He started smoking about 35 years ago. He quit smokeless tobacco use about 25 years ago.  His smokeless tobacco use included chew. He reports current alcohol use of about 12.0 standard drinks of alcohol per week. He reports that he does not use drugs.  Allergies  Allergen Reactions   Entresto [Sacubitril-Valsartan] Other (See Comments)    hypotension   Family History  Problem Relation Age of Onset   Cancer Mother        pancreatic   Cervical cancer Mother    Dementia Father    Heart failure Father    Heart disease Sister    Family history: Family history reviewed and not pertinent.  Prior to Admission medications   Medication Sig Start Date End Date Taking? Authorizing Provider  apixaban (ELIQUIS) 5 MG TABS tablet Take 1 tablet (5 mg total) by mouth 2 (two) times daily. 07/17/23   Dunn, Raymon Mutton, PA-C  furosemide (LASIX) 40 MG tablet Take 1 tablet (40 mg total) by mouth 2 (two) times daily. 06/15/22   Dunn, Raymon Mutton, PA-C  losartan (COZAAR) 25 MG tablet Take 0.5 tablets (12.5 mg total) by mouth daily. 07/15/23 08/14/23  Leeroy Bock, MD  metoprolol tartrate (LOPRESSOR) 50 MG tablet Take 1 tablet (50 mg total) by mouth 2 (  two) times daily. 07/15/23 08/14/23  Leeroy Bock, MD  potassium chloride SA (KLOR-CON M) 20 MEQ tablet Take 1 tablet (20 mEq total) by mouth daily. 06/15/22   Sondra Barges, PA-C   Physical Exam: Vitals:   11/06/23 1654 11/06/23 1707 11/06/23 1730 11/06/23 1745  BP:  (!) 150/105 (!) 152/104 116/88  Pulse: (!) 124 (!) 123 (!) 32   Resp: (!) 33  19 20  Temp:      TempSrc:      SpO2: 97%  93%   Weight:      Height:       Constitutional: appears age appropriate, NAD, calm Eyes: PERRL, lids and conjunctivae normal ENMT: Mucous membranes are moist. Posterior  pharynx clear of any exudate or lesions. Age-appropriate dentition. Hearing appropriate Neck: normal, supple, no masses, no thyromegaly Respiratory: clear to auscultation bilaterally, no wheezing, no crackles. Normal respiratory effort. No accessory muscle use.  Cardiovascular: Regular rate and rhythm, no murmurs / rubs / gallops. B/L lower extremity edema, 3+. 2+ pedal pulses. No carotid bruits.  Abdomen: no tenderness, no masses palpated, no hepatosplenomegaly. Bowel sounds positive.  Musculoskeletal: no clubbing / cyanosis. No joint deformity upper and lower extremities. Good ROM, no contractures, no atrophy. Normal muscle tone.  Skin: no rashes, lesions, ulcers. No induration Neurologic: Sensation intact. Strength 5/5 in all 4.  Psychiatric: Normal judgment and insight. Alert and oriented x 3. Normal mood.   EKG: independently reviewed, showing A-fib with RVR with rate of 143, QTc 478  Chest x-ray on Admission: I personally reviewed and I agree with radiologist reading as below.  DG Chest Port 1 View  Result Date: 11/06/2023 CLINICAL DATA:  Bilateral foot swelling x2 days EXAM: PORTABLE CHEST - 1 VIEW COMPARISON:  07/14/2023 FINDINGS: Mild central pulmonary vascular congestion. No confluent peripheral airspace disease. Heart size and mediastinal contours are within normal limits. No effusion. Visualized bones unremarkable. IMPRESSION: Mild central pulmonary vascular congestion. Electronically Signed   By: Corlis Leak M.D.   On: 11/06/2023 16:20    Labs on Admission: I have personally reviewed following labs  CBC: Recent Labs  Lab 11/06/23 1425  WBC 7.6  HGB 14.2  HCT 38.9*  MCV 87.6  PLT 182   Basic Metabolic Panel: Recent Labs  Lab 11/06/23 1425  NA 137  K 4.6  CL 101  CO2 25  GLUCOSE 96  BUN 16  CREATININE 0.93  CALCIUM 9.0  MG 1.8   GFR: Estimated Creatinine Clearance: 75.3 mL/min (by C-G formula based on SCr of 0.93 mg/dL).  This document was prepared using  Dragon Voice Recognition software and may include unintentional dictation errors.  Dr. Sedalia Muta [facility]  If 7PM-7AM, please contact overnight-coverage provider If 7AM-7PM, please contact day attending provider [email or web address]  11/06/2023, 6:10 PM

## 2023-11-06 NOTE — Assessment & Plan Note (Addendum)
With RVR on admission Home Eliquis 5 mg p.o. twice daily and metoprolol tartrate 50 mg twice daily were resumed on admission Continue diltiazem GGT Resumed home metoprolol 50 mg p.o. twice daily, with first dose including now as patient has missed metoprolol for approximately 2 weeks due to pending medication refill

## 2023-11-06 NOTE — ED Provider Notes (Signed)
[facility] Provider Note   Event Date/Time   First MD Initiated Contact with Patient 11/06/23 1407     (approximate) History  Edema  HPI Donald Molina is a 72 y.o. male with a stated past medical history of paroxysmal atrial fibrillation, heart failure, and hypertension who presents complaining of worsening bilateral lower extremity edema.  Patient states that he has been in his normal state of health until a few days ago when he started noticing worsening swelling in bilateral lower extremities.  Patient states that he has been taking 40 mg twice daily of his Lasix medicine and has not missed any doses.  Patient endorses associated shortness of breath. ROS: Patient currently denies any vision changes, tinnitus, difficulty speaking, facial droop, sore throat, chest pain, abdominal pain, nausea/vomiting/diarrhea, dysuria, or weakness/numbness/paresthesias in any extremity   Physical Exam  Triage Vital Signs: ED Triage Vitals  Encounter Vitals Group     BP --      Systolic BP Percentile --      Diastolic BP Percentile --      Pulse Rate 11/06/23 1406 (!) 143     Resp --      Temp --      Temp src --      SpO2 --      Weight 11/06/23 1344 172 lb (78 kg)     Height 11/06/23 1344 5\' 11"  (1.803 m)     Head Circumference --      Peak Flow --      Pain Score --      Pain Loc --      Pain Education --      Exclude from Growth Chart --    Most recent vital signs: Vitals:   11/06/23 1430 11/06/23 1500  BP: (!) 147/119 (!) 148/105  Pulse: (!) 119 (!) 123  Resp: 19 (!) 24  Temp:  98.2 F (36.8 C)  SpO2: 96% 94%   General: Awake, oriented x4. CV:  Good peripheral perfusion.  Resp:  Normal effort.  Abd:  No distention.  Other:  Elderly well-developed, well-nourished African-American male resting comfortably in no acute distress.  2+ pitting edema to bilateral lower extremities to the knee ED Results / Procedures / Treatments  Labs (all labs ordered are  listed, but only abnormal results are displayed) Labs Reviewed  CBC - Abnormal; Notable for the following components:      Result Value   HCT 38.9 (*)    MCHC 36.5 (*)    All other components within normal limits  BRAIN NATRIURETIC PEPTIDE - Abnormal; Notable for the following components:   B Natriuretic Peptide 1,637.0 (*)    All other components within normal limits  BASIC METABOLIC PANEL  MAGNESIUM  TROPONIN I (HIGH SENSITIVITY)   EKG ED ECG REPORT I, Merwyn Katos, the attending physician, personally viewed and interpreted this ECG. Date: 11/06/2023 EKG Time: 1405 Rate: 143 Rhythm: Atrial fibrillation with rapid ventricular response QRS Axis: normal Intervals: normal ST/T Wave abnormalities: normal Narrative Interpretation: Atrial fibrillation with ventricular response.  No evidence of acute ischemia RADIOLOGY ED MD interpretation: Pending -Agree with radiology assessment Official radiology report(s): No results found. PROCEDURES: Critical Care performed: Yes, see critical care procedure note(s) .1-3 Lead EKG Interpretation  Performed by: Merwyn Katos, MD Authorized by: Merwyn Katos, MD     Interpretation: abnormal     ECG rate:  111   ECG rate assessment: tachycardic     Rhythm: atrial  fibrillation     Ectopy: none     Conduction: normal   CRITICAL CARE Performed by: Merwyn Katos  Total critical care time: 41 minutes  Critical care time was exclusive of separately billable procedures and treating other patients.  Critical care was necessary to treat or prevent imminent or life-threatening deterioration.  Critical care was time spent personally by me on the following activities: development of treatment plan with patient and/or surrogate as well as nursing, discussions with consultants, evaluation of patient's response to treatment, examination of patient, obtaining history from patient or surrogate, ordering and performing treatments and interventions,  ordering and review of laboratory studies, ordering and review of radiographic studies, pulse oximetry and re-evaluation of patient's condition.  MEDICATIONS ORDERED IN ED: Medications  diltiazem (CARDIZEM) 125 mg in dextrose 5% 125 mL (1 mg/mL) infusion (10 mg/hr Intravenous Rate/Dose Change 11/06/23 1500)  furosemide (LASIX) injection 20 mg (has no administration in time range)   IMPRESSION / MDM / ASSESSMENT AND PLAN / ED COURSE  I reviewed the triage vital signs and the nursing notes.                             The patient is on the cardiac monitor to evaluate for evidence of arrhythmia and/or significant heart rate changes. Patient's presentation is most consistent with acute presentation with potential threat to life or bodily function. + atrial fibrillation w/ RVR DDx: Pneumothorax, Pneumonia, Pulmonary Embolus, Tamponade, ACS, Thyrotoxicosis.  No history or evidence decompensated heart failure. Given their history and exam it is likely this patient is unlikely to spontaneously revert to a rate controlled rhythm and necessitates a thorough workup for their arrhythmia. Workup: ECG, CXR, CBC, BMP, UA, Troponin, BNP, TSH, Ca-Mag-Phos Interventions: Defer Cardioversion (uncertain historical reliability with time of onset, increased risk of thromboembolic stroke).  Start diltiazem bolus and drip  Disposition: Admit   FINAL CLINICAL IMPRESSION(S) / ED DIAGNOSES   Final diagnoses:  Atrial fibrillation with rapid ventricular response (HCC)  Bilateral lower extremity edema   Rx / DC Orders   ED Discharge Orders     None      Note:  This document was prepared using Dragon voice recognition software and may include unintentional dictation errors.   Merwyn Katos, MD 11/06/23 [phone]

## 2023-11-06 NOTE — Assessment & Plan Note (Signed)
Home metoprolol tartrate 50 mg p.o. twice daily resumed on admission

## 2023-11-06 NOTE — Hospital Course (Signed)
Mr. Donald Molina is a 72 year old male with medical history of nonischemic heart failure reduced ejection fraction, persistent atrial fibrillation status post DCCV in June 2023, hypertension, history of medication noncompliance leading to recurrent hospital admissions, who presents to the emergency department for chief concerns of bilateral lower extremity swelling.  He was found to be in atrial fibrillation with RVR.  Vitals in the ED showed temperature of 98.2, respiration rate of 24, heart rate 123, blood pressure 148/105, SpO2 of 94% on room air.  Serum sodium is 137, potassium 4.6, chloride 101, bicarb 25, BUN 16, serum creatinine of 0.93, EGFR greater than 60, nonfasting blood glucose 96, WBC 7.6, hemoglobin 14.2, platelets of 182.  BNP is 1637.  High sensitive troponin is 25.  ED treatment: Diltiazem gtt. initiated, furosemide 20 mg IV one-time dose

## 2023-11-06 NOTE — ED Triage Notes (Signed)
Pt to ED bilateral feet swelling x2 days. Denies COPD however states takes fluid pills, denies missed doses. CHF listed as hx in char.t

## 2023-11-07 ENCOUNTER — Telehealth (HOSPITAL_COMMUNITY): Payer: Self-pay | Admitting: Pharmacy Technician

## 2023-11-07 ENCOUNTER — Other Ambulatory Visit (HOSPITAL_COMMUNITY): Payer: Self-pay

## 2023-11-07 DIAGNOSIS — I5023 Acute on chronic systolic (congestive) heart failure: Secondary | ICD-10-CM | POA: Diagnosis not present

## 2023-11-07 DIAGNOSIS — I5043 Acute on chronic combined systolic (congestive) and diastolic (congestive) heart failure: Secondary | ICD-10-CM | POA: Diagnosis not present

## 2023-11-07 LAB — CBC
HCT: 36 % — ABNORMAL LOW (ref 39.0–52.0)
Hemoglobin: 13.2 g/dL (ref 13.0–17.0)
MCH: 32.9 pg (ref 26.0–34.0)
MCHC: 36.7 g/dL — ABNORMAL HIGH (ref 30.0–36.0)
MCV: 89.8 fL (ref 80.0–100.0)
Platelets: 175 10*3/uL (ref 150–400)
RBC: 4.01 MIL/uL — ABNORMAL LOW (ref 4.22–5.81)
RDW: 11.7 % (ref 11.5–15.5)
WBC: 7.3 10*3/uL (ref 4.0–10.5)
nRBC: 0 % (ref 0.0–0.2)

## 2023-11-07 LAB — BASIC METABOLIC PANEL
Anion gap: 9 (ref 5–15)
BUN: 17 mg/dL (ref 8–23)
CO2: 26 mmol/L (ref 22–32)
Calcium: 8.5 mg/dL — ABNORMAL LOW (ref 8.9–10.3)
Chloride: 100 mmol/L (ref 98–111)
Creatinine, Ser: 0.87 mg/dL (ref 0.61–1.24)
GFR, Estimated: >60 mL/min (ref >60)
Glucose, Bld: 112 mg/dL — ABNORMAL HIGH (ref 70–99)
Potassium: 3.8 mmol/L (ref 3.5–5.1)
Sodium: 135 mmol/L (ref 135–145)

## 2023-11-07 MED ORDER — LOSARTAN POTASSIUM 25 MG PO TABS: 12.5000 mg | ORAL_TABLET | Freq: Every day | ORAL | 0 refills | Status: DC

## 2023-11-07 MED ORDER — METOPROLOL TARTRATE 50 MG PO TABS: 50.0000 mg | ORAL_TABLET | Freq: Two times a day (BID) | ORAL | 0 refills | Status: DC

## 2023-11-07 MED ORDER — FUROSEMIDE 40 MG PO TABS: 40.0000 mg | ORAL_TABLET | Freq: Two times a day (BID) | ORAL | 0 refills | Status: DC

## 2023-11-07 NOTE — Care Management Obs Status (Signed)
MEDICARE OBSERVATION STATUS NOTIFICATION   Patient Details  Name: Domonik Minerd MRN: [id number] Date of Birth: 13-Sep-1951   Medicare Observation Status Notification Given:  Yes    Marquita Palms, LCSW 11/07/2023, 10:57 AM

## 2023-11-07 NOTE — Telephone Encounter (Signed)
Patient Product/process development scientist completed.    The patient is insured through [employer]. Patient has Medicare and is not eligible for a copay card, but may be able to apply for patient assistance, if available.    Ran test claim for Entresto 24-26 mg and the current 30 day co-pay is $0.00.  Ran test claim for Farxiga 10 mg and the current 30 day co-pay is $0.00.  Ran test claim for Jardiance 10 mg and the current 30 day co-pay is $0.00.   This test claim was processed through [facility]- copay amounts may vary at other pharmacies due to pharmacy/plan contracts, or as the patient moves through the different stages of their insurance plan.     Roland Earl, CPHT Pharmacy Technician III Certified Patient Advocate [facility] [place] [facility] Patient Advocate Team Direct Number: [phone]  Fax: [phone]

## 2023-11-07 NOTE — Care Management CC44 (Signed)
Condition Code 44 Documentation Completed  Patient Details  Name: Donald Molina MRN: [id number] Date of Birth: Oct 12, 1951   Condition Code 44 given:  Yes Patient signature on Condition Code 44 notice:  Yes Documentation of 2 MD's agreement:  Yes Code 44 added to claim:  Yes    Marquita Palms, LCSW 11/07/2023, 10:57 AM

## 2023-11-07 NOTE — Care Management Important Message (Signed)
Important Message  Patient Details  Name: Donald Molina MRN: [id number] Date of Birth: December 23, 1950   Important Message Given:        Marquita Palms, LCSW 11/07/2023, 10:57 AM

## 2023-11-07 NOTE — Discharge Summary (Signed)
Physician Discharge Summary   Patient: Donald Molina MRN: [id number]  DOB: [birth year]   Admit:     Date of Admission: 11/06/2023 Admitted from: home   Discharge: Date of discharge: 11/07/23 Disposition: Home health Condition at discharge: good  CODE STATUS: FULL CODE     Discharge Physician: Sunnie Nielsen, DO [facility]     PCP: Pcp, No  Recommendations for Outpatient Follow-up:  Follow up with PCP in 2-4 weeks Follow up as directed with cardiology    Discharge Instructions     (HEART FAILURE PATIENTS) Call MD:  Anytime you have any of the following symptoms: 1) 3 pound weight gain in 24 hours or 5 pounds in 1 week 2) shortness of breath, with or without a dry hacking cough 3) swelling in the hands, feet or stomach 4) if you have to sleep on extra pillows at night in order to breathe.   Complete by: As directed    Diet - low sodium heart healthy   Complete by: As directed    Increase activity slowly   Complete by: As directed          Discharge Diagnoses: Principal Problem:   Acute exacerbation of CHF (congestive heart failure) (HCC) Active Problems:   HTN (hypertension)   Elevated troponin   A-fib (HCC)   NICM (nonischemic cardiomyopathy) (HCC)       HPI: Mr. Tamotsu Mesquita is a 72 year old male with medical history of nonischemic heart failure reduced ejection fraction, persistent atrial fibrillation status post DCCV in June 2023, hypertension, history of medication noncompliance leading to recurrent hospital admissions, who presents to the emergency department for chief concerns of bilateral lower extremity swelling. Patient missed home metoprolol due to running out of medications and pending refill. Notes compliance w/ his lasix    Hospital course / significant events:  12/11: admitted to hospitalist service for Afib RVR and CHF exacerbation. Started on diltiazem gtt and diuresis IV.  12/12: off dilt drip, reports edema has  resolved, no SOB, he is eager for discharge home. Meds refilled  Consultants:  none  Procedures/Surgeries: none      ASSESSMENT & PLAN:   Acute exacerbation of CHF (congestive heart failure)  Acute on chronic HFrEF Hx nonischemic cardiomyopathy  Patient had Echo on 07/13/2023: With estimated ejection fraction of 25 to 30%, global hypokinesis.  New echo not ordered on admission as patient has been noncompliant with home heart failure medications which explains current exacerbation Furosemide po w/ instructions on increase dose prn  Home metoprolol tartrate 50 mg p.o. twice daily Refilled ARB Continue ASA   HTN (hypertension) Continue beta blocker, ARB   A-fib (HCC) With RVR on admission Home Eliquis 5 mg p.o. twice daily  metoprolol tartrate 50 mg twice daily Continue diltiazem GGT Resumed home metoprolol 50 mg p.o. twice daily, with first dose including now as patient has missed metoprolol for approximately 2 weeks due to pending medication refill   Elevated troponin Suspect demand ischemia in setting of A-fib with RVR Treat underlying causes as above                Discharge Instructions  Allergies as of 11/07/2023       Reactions   Entresto [sacubitril-valsartan] Other (See Comments)   hypotension        Medication List     STOP taking these medications    potassium chloride SA 20 MEQ tablet Commonly known as: Jerene Dilling  TAKE these medications    apixaban 5 MG Tabs tablet Commonly known as: Eliquis Take 1 tablet (5 mg total) by mouth 2 (two) times daily.   furosemide 40 MG tablet Commonly known as: LASIX Take 1 tablet (40 mg total) by mouth 2 (two) times daily. Increase to 1 tablet (40 mg total) by mouth THREE TIMES daily (total daily dose 120 mg) as needed for up to 3 days for increased leg swelling, shortness of breath, weight gain 5+ lbs over 1-2 days. Seek medical care if these symptoms are not improving with increased  dose. What changed: additional instructions   losartan 25 MG tablet Commonly known as: COZAAR Take 0.5 tablets (12.5 mg total) by mouth daily.   metoprolol tartrate 50 MG tablet Commonly known as: LOPRESSOR Take 1 tablet (50 mg total) by mouth 2 (two) times daily.         Follow-up Information     [facility]. Go in 8 day(s).   Specialty: Cardiology Why: Hospital Follow-Up 11/15/23 @ 9:30AM Please bring all medications to follow-up appointment] [facility], [address], Second Floor Free Valet Parking at the [employer] information: [address] [place] [address] [phone]                Allergies  Allergen Reactions   Entresto [Sacubitril-Valsartan] Other (See Comments)    hypotension     Subjective: pt reports feeling well this morning, no SOB, edema has resolved, no concerns, asking for discharge    Discharge Exam: BP (!) 151/78   Pulse 80   Temp 98.7 F (37.1 C) (Oral)   Resp (!) 28   Ht 5\' 11"  (1.803 m)   Wt 78 kg   SpO2 98%   BMI 23.99 kg/m  General: Pt is alert, awake, not in acute distress Cardiovascular: RRR, S1/S2 +, no rubs, no gallops Respiratory: CTA bilaterally, no wheezing, no rhonchi Abdominal: Soft, NT, ND, bowel sounds + Extremities: no edema, no cyanosis     The results of significant diagnostics from this hospitalization (including imaging, microbiology, ancillary and laboratory) are listed below for reference.     Microbiology: No results found for this or any previous visit (from the past 240 hours).   Labs: BNP (last 3 results) Recent Labs    07/13/23 0821 07/14/23 0455 11/06/23 1425  BNP 1,668.8* 1,561.0* 1,637.0*   Basic Metabolic Panel: Recent Labs  Lab 11/06/23 1425 11/07/23 0453  NA 137 135  K 4.6 3.8  CL 101 100  CO2 25 26  GLUCOSE 96 112*  BUN 16 17  CREATININE 0.93 0.87  CALCIUM 9.0 8.5*  MG 1.8  --     Liver Function Tests: No results for input(s): "AST", "ALT", "ALKPHOS", "BILITOT", "PROT", "ALBUMIN" in the last 168 hours. No results for input(s): "LIPASE", "AMYLASE" in the last 168 hours. No results for input(s): "AMMONIA" in the last 168 hours. CBC: Recent Labs  Lab 11/06/23 1425 11/07/23 0453  WBC 7.6 7.3  HGB 14.2 13.2  HCT 38.9* 36.0*  MCV 87.6 89.8  PLT 182 175   Cardiac Enzymes: No results for input(s): "CKTOTAL", "CKMB", "CKMBINDEX", "TROPONINI" in the last 168 hours. BNP: Invalid input(s): "POCBNP" CBG: No results for input(s): "GLUCAP" in the last 168 hours. D-Dimer No results for input(s): "DDIMER" in the last 72 hours. Hgb A1c No results for input(s): "HGBA1C" in the last 72 hours. Lipid Profile No results for input(s): "CHOL", "HDL", "LDLCALC", "TRIG", "  CHOLHDL", "LDLDIRECT" in the last 72 hours. Thyroid function studies No results for input(s): "TSH", "T4TOTAL", "T3FREE", "THYROIDAB" in the last 72 hours.  Invalid input(s): "FREET3" Anemia work up No results for input(s): "VITAMINB12", "FOLATE", "FERRITIN", "TIBC", "IRON", "RETICCTPCT" in the last 72 hours. Urinalysis No results found for: "COLORURINE", "APPEARANCEUR", "LABSPEC", "PHURINE", "GLUCOSEU", "HGBUR", "BILIRUBINUR", "KETONESUR", "PROTEINUR", "UROBILINOGEN", "NITRITE", "LEUKOCYTESUR" Sepsis Labs Recent Labs  Lab 11/06/23 1425 11/07/23 0453  WBC 7.6 7.3   Microbiology No results found for this or any previous visit (from the past 240 hours). Imaging DG Chest Port 1 View Result Date: 11/06/2023 CLINICAL DATA:  Bilateral foot swelling x2 days EXAM: PORTABLE CHEST - 1 VIEW COMPARISON:  07/14/2023 FINDINGS: Mild central pulmonary vascular congestion. No confluent peripheral airspace disease. Heart size and mediastinal contours are within normal limits. No effusion. Visualized bones unremarkable. IMPRESSION: Mild central pulmonary vascular congestion. Electronically Signed   By: Corlis Leak M.D.    On: 11/06/2023 16:20      Time coordinating discharge: over 30 minutes  SIGNED:  Sunnie Nielsen DO [facility]

## 2023-11-07 NOTE — Progress Notes (Signed)
Heart Failure Nurse Navigator Progress Note  Education Assessment and Provision:  Detailed education and instructions provided and review of heart failure disease management including the following:  Signs and symptoms of Heart Failure When to call the physician Importance of daily weights Low sodium diet Fluid restriction Medication management Anticipated future follow-up appointments- Pt is an established patient of Clar[facility] @ [facility].  Pt missed his last appointment so he was rescheduled and his ride was in the room to confirm transportation for him.  Patient education given on each of the above topics.  Patient acknowledges understanding via teach back method and acceptance of all instructions.  Patient has scale at home: Yes Patient has pill box at home: Yes    Roxy Horseman, RN, BSN [facility] Heart Failure Navigator Secure Chat Only

## 2023-11-14 ENCOUNTER — Telehealth: Payer: Self-pay | Admitting: Family

## 2023-11-14 NOTE — Telephone Encounter (Signed)
Phone kept ringing unable to lvm to confirm appt for 11/14/23

## 2023-11-14 NOTE — Progress Notes (Signed)
Advanced Heart Failure Clinic Note    PCP: None (used to go to [facility]) Cardiologist: Lorine Bears, MD / Eula Listen, PA (last seen 10/23)  Chief Complaint: pedal edema   HPI:  [name] is a 72 y/o male with a history of atrial fibrillation/ flutter (cardioverted 07/23), HTN, pulmonary HTN (06/19), NSTEMI, remote tobacco use and chronic heart failure.   Admitted 04/2018 with A-fib and volume overload. Echo demonstrated an EF of 25 to 30%. Cardiac cath showed normal coronary arteries. Following diuresis and amiodarone loading, he underwent successful DCCV.  He had recurrent A-fib following discharge in the setting of medication noncompliance with subsequent successful cardioversion thereafter following initiation of medications.  Follow-up echo in 06/2018 showed an improved LVEF of 45 to 50%.   Admitted 07/03/2019 with dyspnea recurrent A-fib in the setting of medication noncompliance.  EF was again found to be reduced at 30 to 35% with diffuse hypokinesis.  He was diuresed and placed back on beta-blocker and apixaban   Admitted 03/2022 with volume overload and recurrent A-fib in the context of medication noncompliance.  He reported having not filled his medications for at least a year.  High-sensitivity troponin peaked at 80 with subsequent downtrend.  BNP 1383.  Echo demonstrated an EF of 25%, global hypokinesis with LVH, indeterminate LV diastolic function parameters, moderately reduced RV systolic function with normal ventricular cavity size and mildly increased ventricular wall thickness, elevated PASP estimated at 64 mmHg, moderately dilated left atrium moderate to severe mitral valve regurgitation, moderate tricuspid regurgitation, and an estimated right atrial pressure 15 mmHg.  He underwent diuresis and escalation of GDMT.  He subsequently underwent unusuccessful TEE guided DCCV with 2 shocks at 200 J.    Admitted 07/13/23 due to shortness of breath X 2 days along with PND and  orthopnea. Heart rate in the 130-150s, blood pressure significantly elevated 160/110, no hypoxia. Chest x-ray showed cardiomegaly and pulmonary congestion. Covid +. BNP 1,668. Troponin 21>20. Initially given IV cardizem bolus & gtt. Cardiology consulted. 1 dose of IV lasix given. Converted to NSR on cardizem gtt. Echo this admission showed EF 25-30% with global hypokinesis. Arrhthymia thought to have been triggered by covid. EP consulted but did not see patient while inpatient. Follow-up with EP as outpatient.   Was in the ED 11/06/23 due to worsening bilateral lower leg edema along with shortness of breath. Ran out of metoprolol as he needed refills. Admitted to hospitalist service for Afib RVR and CHF exacerbation. Started on diltiazem gtt and diuresis IV. Edema resolved and off diltiazem gtt.    Echo 07/03/2019: EF 30-35% along with mild/moderate MR.   Echo 08/15/22: EF 50-55% with grade I DD Echo 07/13/23: EF 25-30% with mild LVH, moderately elevated PA Pressure of 57.5 mmHg, severe LAE, moderate MR/ TR/ PR, mild dilatation of ascending aorta of 39 mm, the global longitudinal strain is abnormal (9.3%) with apical sparing. Consider cardiac amyloidosis.   He presents today for a HF follow-up visit (although hasn't been seen since 2020). He presents with a chief complaint of pedal edema. He says that this is chronic although "much better" since his recent admission. Has no other complaints and specifically denies fatigue, shortness of breath, chest pain, cough, palpitations, abdominal distention, dizziness or difficulty sleeping. Reports sleeping well on 2 pillows.   Has not taken meds yet today but plans to do so as soon as he returns home.    Previous cardiac studies:  RHC/ LHC done 05/05/18: normal coronary arteries, severely  elevated filling pressures, moderate pulmonary HTN and severely reduced cardiac output. CO was 2.98 with cardiac index of 1.47  Echo 05/03/18: EF 25-30% along with moderate MR/TR  and moderately elevated PA pressure of 52 mm Hg.  TEE 05/07/18: EF 20-25% along with trivial AR, mild/mod MR & TR and small pleural effusion.  Review of Systems: [y] = yes, [ ]  = no   General: Weight gain [ ] ; Weight loss [ ] ; Anorexia [ ] ; Fatigue [ ] ; Fever [ ] ; Chills [ ] ; Weakness [ ]   Cardiac: Chest pain/pressure [ ] ; Resting SOB [ ] ; Exertional SOB [ ] ; Orthopnea [ ] ; Pedal Edema [name] ]; Palpitations [ ] ; Syncope [ ] ; Presyncope [ ] ; Paroxysmal nocturnal dyspnea[ ]   Pulmonary: Cough [ ] ; Wheezing[ ] ; Hemoptysis[ ] ; Sputum [ ] ; Snoring [ ]   GI: Vomiting[ ] ; Dysphagia[ ] ; Melena[ ] ; Hematochezia [ ] ; Heartburn[ ] ; Abdominal pain [ ] ; Constipation [ ] ; Diarrhea [ ] ; BRBPR [ ]   GU: Hematuria[ ] ; Dysuria [ ] ; Nocturia[ ]   Vascular: Pain in legs with walking [ ] ; Pain in feet with lying flat [ ] ; Non-healing sores [ ] ; Stroke [ ] ; TIA [ ] ; Slurred speech [ ] ;  Neuro: Headaches[ ] ; Vertigo[ ] ; Seizures[ ] ; Paresthesias[ ] ;Blurred vision [ ] ; Diplopia [ ] ; Vision changes [ ]   Ortho/Skin: Arthritis [ ] ; Joint pain [ ] ; Muscle pain [ ] ; Joint swelling [ ] ; Back Pain [ ] ; Rash [ ]   Psych: Depression[ ] ; Anxiety[ ]   Heme: Bleeding problems [ ] ; Clotting disorders [ ] ; Anemia [ ]   Endocrine: Diabetes [ ] ; Thyroid dysfunction[ ]    Past Medical History:  Diagnosis Date   CHF (congestive heart failure) (HCC)    HFrEF (heart failure with reduced ejection fraction) (HCC)    a. 04/2018 Echo: EF 25-30%; b. 06/2018 Echo: EF 45-50%. diff HK, Gr1 DD. Nl RV fxn; c. 06/2019 Echo: EF 30-35%, diff HK, mod reduced RV fxn, mild BAE, mild-mod MR, mild PS.   Hypertension    NICM (nonischemic cardiomyopathy) (HCC)    a. 04/2018 Echo: EF 25-30%; b. 06/2018 Echo: EF 45-50%; c. 06/2019 Echo: EF 30-35%.   Persistent atrial fibrillation (HCC)    a. Dx 04/2018 s/p DCCV and amio loading. CHA2DS2VASc 3-->eliquis.    Current Outpatient Medications  Medication Sig Dispense Refill   apixaban (ELIQUIS) 5 MG TABS tablet Take 1  tablet (5 mg total) by mouth 2 (two) times daily. 180 tablet 3   furosemide (LASIX) 40 MG tablet Take 1 tablet (40 mg total) by mouth 2 (two) times daily. Increase to 1 tablet (40 mg total) by mouth THREE TIMES daily (total daily dose 120 mg) as needed for up to 3 days for increased leg swelling, shortness of breath, weight gain 5+ lbs over 1-2 days. Seek medical care if these symptoms are not improving with increased dose. 90 tablet 0   losartan (COZAAR) 25 MG tablet Take 0.5 tablets (12.5 mg total) by mouth daily. 30 tablet 0   metoprolol tartrate (LOPRESSOR) 50 MG tablet Take 1 tablet (50 mg total) by mouth 2 (two) times daily. 60 tablet 0   No current facility-administered medications for this visit.    Allergies  Allergen Reactions   Entresto [Sacubitril-Valsartan] Other (See Comments)    hypotension      Social History   Socioeconomic History   Marital status: Divorced    Spouse name: Not on file   Number of children: 2   Years of education: 79  Highest education level: 12th grade  Occupational History   Occupation: retired  Tobacco Use   Smoking status: Former    Current packs/day: 0.00    Types: Cigarettes    Start date: 05/28/1988    Quit date: 05/28/1998    Years since quitting: 25.4   Smokeless tobacco: Former    Types: Chew    Quit date: 05/28/1998  Vaping Use   Vaping status: Never Used  Substance and Sexual Activity   Alcohol use: Yes    Alcohol/week: 12.0 standard drinks of alcohol    Types: 12 Cans of beer per week   Drug use: Never   Sexual activity: Yes    Birth control/protection: Condom  Other Topics Concern   Not on file  Social History Narrative   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   Social Drivers of Corporate investment banker Strain: Low Risk  (08/12/2019)   [facility], [facility]   Overall Financial Resource Strain (CARDIA)    Difficulty of Paying Living Expenses: Not hard at all  Food  Insecurity: No Food Insecurity (07/13/2023)   Hunger Vital Sign    Worried About Running Out of Food in the Last Year: Never true    Ran Out of Food in the Last Year: Never true  Transportation Needs: No Transportation Needs (07/13/2023)   PRAPARE - Administrator, Civil Service (Medical): No    Lack of Transportation (Non-Medical): No  Physical Activity: Insufficiently Active (05/28/2018)   Exercise Vital Sign    Days of Exercise per Week: 7 days    Minutes of Exercise per Session: 20 min  Stress: No Stress Concern Present (05/28/2018)   [employer] of Occupational Health - Occupational Stress Questionnaire    Feeling of Stress : Not at all  Social Connections: Not on file  Intimate Partner Violence: Not At Risk (07/13/2023)   Humiliation, Afraid, Rape, and Kick questionnaire    Fear of Current or Ex-Partner: No    Emotionally Abused: No    Physically Abused: No    Sexually Abused: No      Family History  Problem Relation Age of Onset   Cancer Mother        pancreatic   Cervical cancer Mother    Dementia Father    Heart failure Father    Heart disease Sister    Vitals:   11/15/23 0928  BP: (!) 155/90  Pulse: 68  SpO2: 97%  Weight: 173 lb 12.8 oz (78.8 kg)  Height: 5\' 11"  (1.803 m)   Wt Readings from Last 3 Encounters:  11/15/23 173 lb 12.8 oz (78.8 kg)  11/06/23 172 lb (78 kg)  07/14/23 168 lb 14 oz (76.6 kg)   Lab Results  Component Value Date   CREATININE 0.87 11/07/2023   CREATININE 0.93 11/06/2023   CREATININE 1.00 07/15/2023   PHYSICAL EXAM: General:  Well appearing. No respiratory difficulty HEENT: normal Neck: supple. no JVD. No lymphadenopathy or thyromegaly appreciated. Cor: PMI nondisplaced. Regular rate & irregular rhythm. No rubs, gallops or murmurs. Lungs: clear Abdomen: soft, nontender, nondistended. No hepatosplenomegaly. No bruits or masses.  Extremities: no cyanosis, clubbing, rash, edema Neuro: alert & oriented x 3, cranial  nerves grossly intact. moves all 4 extremities w/o difficulty. Affect pleasant.  ECG: AF with HR 95 (personally reviewed)   ASSESSMENT & PLAN:  1: NICM with reduced ejection fraction- - suspect due to ? pHTN, amyloid, persistent atrial  fibrillation - NYHA class II - euvolemic today - begin weighing daily and call for an overnight weight gain of >2 pounds or a weekly weight gain of >5 pounds - Echo 08/15/22: EF 50-55% with grade I DD - Echo 07/13/23: EF 25-30% with mild LVH, moderately elevated PA Pressure of 57.5 mmHg, severe LAE, moderate MR/ TR/ PR, mild dilatation of ascending aorta of 39 mm, the global longitudinal strain is abnormal (9.3%) with apical sparing. Consider cardiac amyloidosis.  - will schedule cMRI to evaluate for amyloid - begin farxiga 10mg  daily & do BMET next visit - with starting farxiga, decrease furosemide to 40mg  daily with additional 40mg  PRN  - continue losartan 12.5mg  daily; entresto caused hypotension in the past - continue metoprolol tartrate 50mg  BID - saw cardiology (Dunn) 10/23; needs t/u scheduled - BMET today - BNP 11/06/23 was 1637.0  2: HTN- - BP 155/90 - has seen PCP @ [facility] in the past; emphasized that he get a follow-up appointment scheduled - BMP 11/07/23 reviewed and showed sodium 135, potassium 3.8, creatinine 0.87 and GFR >60.  - BMET today  3: Persistent Atrial fibrillation/ flutter- - cardioverted 07/23 - EKG today shows AF - continue apixaban 5mg  BID - continue metoprololl tartrate 50mg  BID  4: CAD- - RHC/ LHC done 05/05/18: normal coronary arteries, severely elevated filling pressures, moderate pulmonary HTN and severely reduced cardiac output. CO was 2.98 with cardiac index of 1.47 - LDL 08/04/20 was 119 - lipid panel today   Return in 3-4 weeks, sooner if needed. Will have follow-up with HF MD after cMRI done.

## 2023-11-15 ENCOUNTER — Ambulatory Visit: Payer: Medicare HMO | Attending: Family | Admitting: Family

## 2023-11-15 ENCOUNTER — Other Ambulatory Visit (HOSPITAL_COMMUNITY): Payer: Self-pay

## 2023-11-15 ENCOUNTER — Encounter: Payer: Self-pay | Admitting: Family

## 2023-11-15 VITALS — BP 155/90 | HR 68 | Ht 71.0 in | Wt 173.8 lb

## 2023-11-15 DIAGNOSIS — I10 Essential (primary) hypertension: Secondary | ICD-10-CM | POA: Diagnosis not present

## 2023-11-15 DIAGNOSIS — I252 Old myocardial infarction: Secondary | ICD-10-CM | POA: Diagnosis not present

## 2023-11-15 DIAGNOSIS — Z7901 Long term (current) use of anticoagulants: Secondary | ICD-10-CM | POA: Diagnosis not present

## 2023-11-15 DIAGNOSIS — I272 Pulmonary hypertension, unspecified: Secondary | ICD-10-CM | POA: Diagnosis not present

## 2023-11-15 DIAGNOSIS — I4819 Other persistent atrial fibrillation: Secondary | ICD-10-CM | POA: Diagnosis not present

## 2023-11-15 DIAGNOSIS — Z79899 Other long term (current) drug therapy: Secondary | ICD-10-CM | POA: Diagnosis not present

## 2023-11-15 DIAGNOSIS — I428 Other cardiomyopathies: Secondary | ICD-10-CM | POA: Diagnosis not present

## 2023-11-15 DIAGNOSIS — I110 Hypertensive heart disease with heart failure: Secondary | ICD-10-CM | POA: Insufficient documentation

## 2023-11-15 DIAGNOSIS — I2510 Atherosclerotic heart disease of native coronary artery without angina pectoris: Secondary | ICD-10-CM | POA: Insufficient documentation

## 2023-11-15 DIAGNOSIS — I5022 Chronic systolic (congestive) heart failure: Secondary | ICD-10-CM | POA: Insufficient documentation

## 2023-11-15 MED ORDER — DAPAGLIFLOZIN PROPANEDIOL 10 MG PO TABS
10.0000 mg | ORAL_TABLET | Freq: Every day | ORAL | 3 refills | Status: AC
Start: 2023-11-15 — End: ?

## 2023-11-15 MED ORDER — FUROSEMIDE 40 MG PO TABS: ORAL_TABLET | ORAL | 3 refills | Status: DC

## 2023-11-15 NOTE — Patient Instructions (Addendum)
Medication Changes:  TAKE LASIX (FUROSEMIDE) 40MG  IN THE MORNING AND 40MG  IN THE EVENING AS NEEDED   START: FARXIGA 10MG  ONCE DAILY   Lab Work:  Go DOWN to LOWER LEVEL (LL) to have your blood work completed inside of [employer] office.  We will only call you if the results are abnormal or if the provider would like to make medication changes.   Testing/Procedures:  SOMEONE WILL CALL YOU TO GET YOU SCHEDULED FOR THIS ONCE APPROVED WITH INSURANCE   [facility] [address] [place], [place] [address] [phone] Please go to the [facility] and check-in with the desk attendant.   Magnetic resonance imaging (MRI) is a painless test that produces images of the inside of the body without using Xrays.  During an MRI, strong magnets and radio waves work together in a Data processing manager to form detailed images.   MRI images may provide more details about a medical condition than X-rays, CT scans, and ultrasounds can provide.  You may be given earphones to listen for instructions.  You may eat a light breakfast and take medications as ordered with the exception of furosemide, hydrochlorothiazide, chlorthalidone or spironolactone (or any other fluid pill). If you are undergoing a stress MRI, please avoid stimulants for 12 hr prior to test. (I.e. Caffeine, nicotine, chocolate, or antihistamine medications)  An IV will be inserted into one of your veins. Contrast material will be injected into your IV. It will leave your body through your urine within a day. You may be told to drink plenty of fluids to help flush the contrast material out of your system.  You will be asked to remove all metal, including: Watch, jewelry, and other metal objects including hearing aids, hair pieces and dentures. Also wearable glucose monitoring systems (ie. Freestyle Libre and Omnipods) (Braces and fillings normally are not a problem.)  TEST WILL TAKE APPROXIMATELY 1 HOUR  PLEASE  NOTIFY SCHEDULING AT LEAST 24 HOURS IN ADVANCE IF YOU ARE UNABLE TO KEEP YOUR APPOINTMENT. [phone]  For more information and frequently asked questions, please visit our website : [email or web address]  Please call the Cardiac Imaging Nurse Navigators with any questions/concerns. [phone] Office   Follow-Up in: 3 WEEKS AS SCHEDULED   If you have any questions or concerns before your next appointment please send [place] a message through mychart or call our office at [phone] Monday-Friday 8 am-5 pm.   If you have an urgent need after hours on the weekend please call your Primary Cardiologist or the Advanced Heart Failure Clinic in [place] at [phone].   At the Advanced [facility] Failure Clinic, you and your health needs are our priority. We have a designated team specialized in the treatment of Heart Failure. This Care Team includes your primary Heart Failure Specialized Cardiologist (physician), Advanced Practice Providers (APPs- Physician Assistants and Nurse Practitioners), and Pharmacist who all work together to provide you with the care you need, when you need it.   You may see any of the following providers on your designated Care Team at your next follow up:  Dr. Arvilla Meres Dr. Marca Ancona Dr. Dorthula Nettles Dr. Theresia Bough Tonye Becket, NP Robbie Lis, [place] [facility] [place], [place] Brynda Peon, NP [place] Lee, NP Clarisa Kindred, NP Enos Fling, PharmD      Begin weighing daily and call for an overnight weight gain of 3 pounds or more or a weekly weight gain of more than 5 pounds.

## 2023-11-16 LAB — BASIC METABOLIC PANEL
BUN/Creatinine Ratio: 16 (ref 10–24)
BUN: 14 mg/dL (ref 8–27)
CO2: 24 mmol/L (ref 20–29)
Calcium: 9.1 mg/dL (ref 8.6–10.2)
Chloride: 101 mmol/L (ref 96–106)
Creatinine, Ser: 0.9 mg/dL (ref 0.76–1.27)
Glucose: 88 mg/dL (ref 70–99)
Potassium: 3.4 mmol/L — ABNORMAL LOW (ref 3.5–5.2)
Sodium: 144 mmol/L (ref 134–144)
eGFR: 90 mL/min/{1.73_m2} (ref >59)

## 2023-11-16 LAB — LIPID PANEL
Chol/HDL Ratio: 3.9 {ratio} (ref 0.0–5.0)
Cholesterol, Total: 140 mg/dL (ref 100–199)
HDL: 36 mg/dL — ABNORMAL LOW (ref >39)
LDL Chol Calc (NIH): 84 mg/dL (ref 0–99)
Triglycerides: 107 mg/dL (ref 0–149)
VLDL Cholesterol Cal: 20 mg/dL (ref 5–40)

## 2023-11-21 ENCOUNTER — Telehealth: Payer: Self-pay

## 2023-11-21 NOTE — Telephone Encounter (Signed)
-----   Message from Nurse Eileen Stanford B sent at 11/15/2023 10:43 AM EST ----- Regarding: pre cert for CMRI Hi patient needs cardiac mri for hf- this has been ordered for here at [facility]    Can you precert?   Thank you

## 2023-11-28 NOTE — Progress Notes (Deleted)
 Advanced Heart Failure Clinic Note    PCP: None (used to go to [facility]) Cardiologist: Deatrice Cage, MD / Abigail Motto, PA (last seen 10/23)  Chief Complaint:    HPI:  [name] is a 73 y/o male with a history of atrial fibrillation/ flutter (cardioverted 07/23), HTN, pulmonary HTN (06/19), NSTEMI, remote tobacco use and chronic heart failure.   Admitted 04/2018 with A-fib and volume overload. Echo demonstrated an EF of 25 to 30%. Cardiac cath showed normal coronary arteries. Following diuresis and amiodarone  loading, he underwent successful DCCV.  He had recurrent A-fib following discharge in the setting of medication noncompliance with subsequent successful cardioversion thereafter following initiation of medications.  Follow-up echo in 06/2018 showed an improved LVEF of 45 to 50%.   Admitted 07/03/2019 with dyspnea recurrent A-fib in the setting of medication noncompliance.  EF was again found to be reduced at 30 to 35% with diffuse hypokinesis.  He was diuresed and placed back on beta-blocker and apixaban    Admitted 03/2022 with volume overload and recurrent A-fib in the context of medication noncompliance.  He reported having not filled his medications for at least a year.  High-sensitivity troponin peaked at 80 with subsequent downtrend.  BNP 1383.  Echo demonstrated an EF of 25%, global hypokinesis with LVH, indeterminate LV diastolic function parameters, moderately reduced RV systolic function with normal ventricular cavity size and mildly increased ventricular wall thickness, elevated PASP estimated at 64 mmHg, moderately dilated left atrium moderate to severe mitral valve regurgitation, moderate tricuspid regurgitation, and an estimated right atrial pressure 15 mmHg.  He underwent diuresis and escalation of GDMT.  He subsequently underwent unusuccessful TEE guided DCCV with 2 shocks at 200 J.    Admitted 07/13/23 due to shortness of breath X 2 days along with PND and orthopnea. Heart  rate in the 130-150s, blood pressure significantly elevated 160/110, no hypoxia. Chest x-ray showed cardiomegaly and pulmonary congestion. Covid +. BNP 1,668. Troponin 21>20. Initially given IV cardizem  bolus & gtt. Cardiology consulted. 1 dose of IV lasix  given. Converted to NSR on cardizem  gtt. Echo this admission showed EF 25-30% with global hypokinesis. Arrhthymia thought to have been triggered by covid. EP consulted but did not see patient while inpatient. Follow-up with EP as outpatient.   Was in the ED 11/06/23 due to worsening bilateral lower leg edema along with shortness of breath. Ran out of metoprolol  as he needed refills. Admitted to hospitalist service for Afib RVR and CHF exacerbation. Started on diltiazem  gtt and diuresis IV. Edema resolved and off diltiazem  gtt.    Echo 07/03/2019: EF 30-35% along with mild/moderate MR.   Echo 08/15/22: EF 50-55% with grade I DD Echo 07/13/23: EF 25-30% with mild LVH, moderately elevated PA Pressure of 57.5 mmHg, severe LAE, moderate MR/ TR/ PR, mild dilatation of ascending aorta of 39 mm, the global longitudinal strain is abnormal (9.3%) with apical sparing. Consider cardiac amyloidosis.   He presents today for a HF follow-up visit with a chief complaint of  At last visit, farxiga  10mg  was started & furosemide  decreased.    Previous cardiac studies:  RHC/ LHC done 05/05/18: normal coronary arteries, severely elevated filling pressures, moderate pulmonary HTN and severely reduced cardiac output. CO was 2.98 with cardiac index of 1.47  Echo 05/03/18: EF 25-30% along with moderate MR/TR and moderately elevated PA pressure of 52 mm Hg.  TEE 05/07/18: EF 20-25% along with trivial AR, mild/mod MR & TR and small pleural effusion.  ROS: All systems negative except as listed in HPI, PMH and Problem List.  SH:  Social History   Socioeconomic History   Marital status: Divorced    Spouse name: Not on file   Number of children: 2   Years of education:  12   Highest education level: 12th grade  Occupational History   Occupation: retired  Tobacco Use   Smoking status: Former    Current packs/day: 0.00    Types: Cigarettes    Start date: 05/28/1988    Quit date: 05/28/1998    Years since quitting: 25.5   Smokeless tobacco: Former    Types: Chew    Quit date: 05/28/1998  Vaping Use   Vaping status: Never Used  Substance and Sexual Activity   Alcohol use: Yes    Alcohol/week: 12.0 standard drinks of alcohol    Types: 12 Cans of beer per week   Drug use: Never   Sexual activity: Yes    Birth control/protection: Condom  Other Topics Concern   Not on file  Social History Narrative   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   Social Drivers of Corporate Investment Banker Strain: Low Risk  (08/12/2019)   [facility], [facility]   Overall Financial Resource Strain (CARDIA)    Difficulty of Paying Living Expenses: Not hard at all  Food Insecurity: No Food Insecurity (07/13/2023)   Hunger Vital Sign    Worried About Running Out of Food in the Last Year: Never true    Ran Out of Food in the Last Year: Never true  Transportation Needs: No Transportation Needs (07/13/2023)   PRAPARE - Administrator, Civil Service (Medical): No    Lack of Transportation (Non-Medical): No  Physical Activity: Insufficiently Active (05/28/2018)   Exercise Vital Sign    Days of Exercise per Week: 7 days    Minutes of Exercise per Session: 20 min  Stress: No Stress Concern Present (05/28/2018)   [employer] of Occupational Health - Occupational Stress Questionnaire    Feeling of Stress : Not at all  Social Connections: Not on file  Intimate Partner Violence: Not At Risk (07/13/2023)   Humiliation, Afraid, Rape, and Kick questionnaire    Fear of Current or Ex-Partner: No    Emotionally Abused: No    Physically Abused: No    Sexually Abused: No    FH:  Family History  Problem Relation Age of  Onset   Cancer Mother        pancreatic   Cervical cancer Mother    Dementia Father    Heart failure Father    Heart disease Sister     Past Medical History:  Diagnosis Date   CHF (congestive heart failure) (HCC)    HFrEF (heart failure with reduced ejection fraction) (HCC)    a. 04/2018 Echo: EF 25-30%; b. 06/2018 Echo: EF 45-50%. diff HK, Gr1 DD. Nl RV fxn; c. 06/2019 Echo: EF 30-35%, diff HK, mod reduced RV fxn, mild BAE, mild-mod MR, mild PS.   Hypertension    NICM (nonischemic cardiomyopathy) (HCC)    a. 04/2018 Echo: EF 25-30%; b. 06/2018 Echo: EF 45-50%; c. 06/2019 Echo: EF 30-35%.   Persistent atrial fibrillation (HCC)    a. Dx 04/2018 s/p DCCV and amio loading. CHA2DS2VASc 3-->eliquis .    Current Outpatient Medications  Medication Sig Dispense Refill   apixaban  (ELIQUIS ) 5 MG TABS tablet Take 1 tablet (5 mg total)  by mouth 2 (two) times daily. 180 tablet 3   dapagliflozin  propanediol (FARXIGA ) 10 MG TABS tablet Take 1 tablet (10 mg total) by mouth daily before breakfast. 90 tablet 3   furosemide  (LASIX ) 40 MG tablet TAKE 40MG  IN THE MORNING AND 40MG  IN THE EVENING AS NEEDED 90 tablet 3   losartan  (COZAAR ) 25 MG tablet Take 0.5 tablets (12.5 mg total) by mouth daily. 30 tablet 0   metoprolol  tartrate (LOPRESSOR ) 50 MG tablet Take 1 tablet (50 mg total) by mouth 2 (two) times daily. 60 tablet 0   No current facility-administered medications for this visit.      PHYSICAL EXAM:  General:  Well appearing. No resp difficulty HEENT: normal Neck: supple. JVP flat. Carotids 2+ bilaterally; no bruits. No lymphadenopathy or thryomegaly appreciated. Cor: PMI normal. Regular rate & rhythm. No rubs, gallops or murmurs. Lungs: clear Abdomen: soft, nontender, nondistended. No hepatosplenomegaly. No bruits or masses. Good bowel sounds. Extremities: no cyanosis, clubbing, rash, edema Neuro: alert & oriented x3, cranial nerves grossly intact. Moves all 4 extremities w/o difficulty. Affect  pleasant.   ECG:   ASSESSMENT & PLAN:  1: NICM with reduced ejection fraction- - suspect due to ? pHTN, amyloid, persistent atrial fibrillation - NYHA class II - euvolemic today - begin weighing daily and call for an overnight weight gain of >2 pounds or a weekly weight gain of >5 pounds - weight  - Echo 08/15/22: EF 50-55% with grade I DD - Echo 07/13/23: EF 25-30% with mild LVH, moderately elevated PA Pressure of 57.5 mmHg, severe LAE, moderate MR/ TR/ PR, mild dilatation of ascending aorta of 39 mm, the global longitudinal strain is abnormal (9.3%) with apical sparing. Consider cardiac amyloidosis.  - will schedule cMRI to evaluate for amyloid - continue farxiga  10mg  daily  - continue furosemide  40mg  daily with additional 40mg  PRN  - continue losartan  12.5mg  daily; entresto  caused hypotension in the past - continue metoprolol  tartrate 50mg  BID - saw cardiology (Dunn) 10/23; needs t/u scheduled - BMET today - BNP 11/06/23 was 1637.0  2: HTN- - BP  - has seen PCP @ [facility] in the past; emphasized that he get a follow-up appointment scheduled - BMP 11/15/23 reviewed and showed sodium 144, potassium 3.4, creatinine 0.9 and GFR 90 .  - BMET today  3: Persistent Atrial fibrillation/ flutter- - cardioverted 07/23 - continue apixaban  5mg  BID - continue metoprololl tartrate 50mg  BID  4: CAD- - RHC/ LHC done 05/05/18: normal coronary arteries, severely elevated filling pressures, moderate pulmonary HTN and severely reduced cardiac output. CO was 2.98 with cardiac index of 1.47 - LDL 08/04/20 was 119 - LDL 11/15/23 was 84

## 2023-11-29 ENCOUNTER — Encounter: Payer: Medicare HMO | Admitting: Family

## 2023-11-29 ENCOUNTER — Telehealth: Payer: Self-pay | Admitting: Family

## 2023-11-29 NOTE — Telephone Encounter (Signed)
 Patient did not show for his Heart Failure Clinic appointment on 11/29/23.

## 2024-01-24 ENCOUNTER — Telehealth: Payer: Self-pay | Admitting: Cardiovascular Disease

## 2024-01-24 NOTE — Telephone Encounter (Signed)
   Name: Donald Molina  DOB: 1950/12/30  MRN: [id number]  Primary Cardiologist: Lorine Bears, MD  Chart reviewed as part of pre-operative protocol coverage. Because of Donald Molina past medical history and time since last visit, he will require a follow-up in-office visit in order to better assess preoperative cardiovascular risk. Pt has not been seen since 2023.   Pre-op covering staff: - Please schedule appointment and call patient to inform them. If patient already had an upcoming appointment within acceptable timeframe, please add "pre-op clearance" to the appointment notes so provider is aware. - Please contact requesting surgeon's office via preferred method (i.e, phone, fax) to inform them of need for appointment prior to surgery.  This message will also be routed to pharmacy pool for input on holding Eliquis as requested below so that this information is available to the clearing provider at time of patient's appointment.   Joylene Grapes, NP  01/24/2024, 4:07 PM

## 2024-01-24 NOTE — Telephone Encounter (Signed)
 Our office just received a request today for dental procedure 01/27/24 and pt is on a blood thinner. I called the dental office and left vm per the preop APP the pt is going to need an appt in office for preop clearance.   I left a vm to call back.

## 2024-01-24 NOTE — Telephone Encounter (Signed)
 Called patient to set up in-office appt for pre-op clearance, no answer left a vm.

## 2024-01-24 NOTE — Telephone Encounter (Signed)
   Pre-operative Risk Assessment    Patient Name: Donald Molina  DOB: April 27, 1951 MRN: [id number]     Request for Surgical Clearance    Procedure:  Dental Extraction - Amount of Teeth to be Pulled:  6 teeth   Date of Surgery:  Clearance 01/27/24                                 Surgeon:  Dr. Satira Anis  Surgeon's Group or Practice Name:  [facility]  Phone number:  [phone]  Fax number:  [phone]   Type of Clearance Requested:   - Medical  - Pharmacy:  Hold Apixaban (Eliquis)     Type of Anesthesia:  Local    Additional requests/questions:    Signed, April Henson   01/24/2024, 4:03 PM

## 2024-01-27 ENCOUNTER — Telehealth: Payer: Self-pay | Admitting: Physician Assistant

## 2024-01-27 NOTE — Telephone Encounter (Signed)
   Pre-operative Risk Assessment    Patient Name: Donald Molina  DOB: [birth year] MRN: [id number]   Date of last office visit: 09/21/22 Date of next office visit: n/a  Request for Surgical Clearance    Procedure:  Dental Extraction - Amount of Teeth to be Pulled:  6  Date of Surgery:  Clearance TBD                                Surgeon:  Dr. Veleta Miners Group or Practice Name:  [facility] Phone number:  [phone] Fax number:  [phone]   Type of Clearance Requested:  Medical, Pharmacy Eliquis, Losartan, Metoprolol    Type of Anesthesia:  Local    Additional requests/questions:    Signed, Lauralee Evener V   01/27/2024, 10:11 AM

## 2024-01-27 NOTE — Telephone Encounter (Signed)
     Name: Donald Molina  DOB: Nov 01, 1951  MRN: [id number]   Primary Cardiologist: Lorine Bears, MD   Chart reviewed as part of pre-operative protocol coverage. Because of Donald Molina past medical history and time since last visit, he will require a follow-up in-office visit in order to better assess preoperative cardiovascular risk. Pt has not been seen since 2023.    Pre-op covering staff: - Please schedule appointment and call patient to inform them. If patient already had an upcoming appointment within acceptable timeframe, please add "pre-op clearance" to the appointment notes so provider is aware. - Please contact requesting surgeon's office via preferred method (i.e, phone, fax) to inform them of need for appointment prior to surgery.   This message will also be routed to pharmacy pool for input on holding Eliquis as requested below so that this information is available to the clearing provider at time of patient's appointment.      Perlie Gold, PA-C

## 2024-01-27 NOTE — Telephone Encounter (Signed)
 Perlie Gold, PA-C  Cv Div Preop Pharm26 minutes ago (10:37 AM)    Pharmacy please advise on holding Eliquis prior to dental extraction, 6 teeth. Date currently TBD. Please route your response to P CV DIV PREOP. Thank you!

## 2024-01-28 NOTE — Telephone Encounter (Signed)
 Patient with diagnosis of A Fib on Eliquis for anticoagulation.    Procedure: Dental Extraction - Amount of Teeth to be Pulled:  6 teeth  Date of procedure: TBD   CHA2DS2-VASc Score = 4  This indicates a 4.8% annual risk of stroke. The patient's score is based upon: CHF History: 1 HTN History: 1 Diabetes History: 0 Stroke History: 0 Vascular Disease History: 1 Age Score: 1 Gender Score: 0    CrCl 81 ml/min Platelet count 175K  Patient does not require pre-op antibiotics for dental procedure.  Per office protocol, patient can hold Eliquis for 2 days prior to procedure.    **This guidance is not considered finalized until pre-operative APP has relayed final recommendations.**

## 2024-01-29 NOTE — Telephone Encounter (Signed)
 2nd attempt to reach the pt to schedule an appt in office for preop clearance.

## 2024-01-30 NOTE — Telephone Encounter (Signed)
 Duplicate request. See phone note from 2/28

## 2024-01-30 NOTE — Telephone Encounter (Signed)
 3rd final attempt to reach pt regarding surgical clearance and the need for a IN OFFICE appointment, have been unsuccessful.  Unable to leave a message as voicemail is full.   Will delete out of preop pool and send back to the requesting surgoen's office, if they can get a hold of pt and have him call the office to schedule.

## 2024-03-01 ENCOUNTER — Other Ambulatory Visit: Payer: Self-pay

## 2024-03-01 ENCOUNTER — Emergency Department
Admission: EM | Admit: 2024-03-01 | Discharge: 2024-03-01 | Disposition: A | Discharge status: Home / Self Care | Attending: Emergency Medicine | Admitting: Emergency Medicine

## 2024-03-01 ENCOUNTER — Emergency Department

## 2024-03-01 DIAGNOSIS — I110 Hypertensive heart disease with heart failure: Secondary | ICD-10-CM | POA: Diagnosis not present

## 2024-03-01 DIAGNOSIS — M7989 Other specified soft tissue disorders: Secondary | ICD-10-CM | POA: Diagnosis not present

## 2024-03-01 DIAGNOSIS — Z7901 Long term (current) use of anticoagulants: Secondary | ICD-10-CM | POA: Diagnosis not present

## 2024-03-01 DIAGNOSIS — I517 Cardiomegaly: Secondary | ICD-10-CM | POA: Diagnosis not present

## 2024-03-01 DIAGNOSIS — R600 Localized edema: Secondary | ICD-10-CM | POA: Insufficient documentation

## 2024-03-01 DIAGNOSIS — I10 Essential (primary) hypertension: Secondary | ICD-10-CM | POA: Diagnosis not present

## 2024-03-01 DIAGNOSIS — I5020 Unspecified systolic (congestive) heart failure: Secondary | ICD-10-CM | POA: Diagnosis not present

## 2024-03-01 LAB — CBC
HCT: 37.6 % — ABNORMAL LOW (ref 39.0–52.0)
Hemoglobin: 13.9 g/dL (ref 13.0–17.0)
MCH: 32.7 pg (ref 26.0–34.0)
MCHC: 37 g/dL — ABNORMAL HIGH (ref 30.0–36.0)
MCV: 88.5 fL (ref 80.0–100.0)
Platelets: 206 10*3/uL (ref 150–400)
RBC: 4.25 MIL/uL (ref 4.22–5.81)
RDW: 12.2 % (ref 11.5–15.5)
WBC: 6 10*3/uL (ref 4.0–10.5)
nRBC: 0 % (ref 0.0–0.2)

## 2024-03-01 LAB — BASIC METABOLIC PANEL WITH GFR
Anion gap: 13 (ref 5–15)
BUN: 15 mg/dL (ref 8–23)
CO2: 21 mmol/L — ABNORMAL LOW (ref 22–32)
Calcium: 8.8 mg/dL — ABNORMAL LOW (ref 8.9–10.3)
Chloride: 104 mmol/L (ref 98–111)
Creatinine, Ser: 1.04 mg/dL (ref 0.61–1.24)
GFR, Estimated: >60 mL/min (ref >60)
Glucose, Bld: 104 mg/dL — ABNORMAL HIGH (ref 70–99)
Potassium: 3.3 mmol/L — ABNORMAL LOW (ref 3.5–5.1)
Sodium: 138 mmol/L (ref 135–145)

## 2024-03-01 LAB — TROPONIN I (HIGH SENSITIVITY): Troponin I (High Sensitivity): 17 ng/L (ref <18)

## 2024-03-01 LAB — BRAIN NATRIURETIC PEPTIDE: B Natriuretic Peptide: 420.8 pg/mL — ABNORMAL HIGH (ref 0.0–100.0)

## 2024-03-01 MED ORDER — FUROSEMIDE 40 MG PO TABS: ORAL_TABLET | ORAL | 3 refills | Status: DC

## 2024-03-01 NOTE — ED Notes (Signed)
 ..  The patient is A&OX4, ambulatory at d/c with independent steady gait, NAD. Pt verbalized understanding of d/c instructions, prescription and follow up care.

## 2024-03-01 NOTE — ED Provider Notes (Signed)
 [facility] Provider Note    Event Date/Time   First MD Initiated Contact with Patient 03/01/24 1807     (approximate)   History   Leg Swelling   HPI Donald Molina is a 73 y.o. male with history of A-fib on Eliquis, HTN, HFrEF presenting today for leg swelling.  Patient states over the past several days he has had some increased swelling around his ankle.  Does have history of CHF and has had this happen before.  Denies any pain in his ankles or calves.  No shortness of breath, chest pain, fevers, chills.  No missed doses of his Eliquis.  Patient states he ran out of his Lasix 1 week ago and needs a new prescription.  Chart review: Reviewed most recent echo on 07/13/2023 with patient having EF 25 to 30%.     Physical Exam   Triage Vital Signs: ED Triage Vitals [03/01/24 1652]  Encounter Vitals Group     BP (!) 156/105     Systolic BP Percentile      Diastolic BP Percentile      Pulse Rate 62     Resp 18     Temp 98 F (36.7 C)     Temp src      SpO2 97 %     Weight 172 lb (78 kg)     Height 5\' 9"  (1.753 m)     Head Circumference      Peak Flow      Pain Score 0     Pain Loc      Pain Education      Exclude from Growth Chart     Most recent vital signs: Vitals:   03/01/24 1652  BP: (!) 156/105  Pulse: 62  Resp: 18  Temp: 98 F (36.7 C)  SpO2: 97%   I have reviewed the vital signs. General:  Awake, alert, no acute distress. Head:  Normocephalic, Atraumatic. EENT:  PERRL, EOMI, Oral mucosa pink and moist, Neck is supple. Cardiovascular: Regular rate, 2+ distal pulses. Respiratory:  Normal respiratory effort, symmetrical expansion, no distress.   Extremities:  Moving all four extremities through full ROM without pain.  2+ pitting edema around the ankles but nothing higher up on the tibias.  No tenderness palpation throughout bilateral lower extremities.  No erythema or warmth. Neuro:  Alert and oriented.  Interacting appropriately.    Skin:  Warm, dry, no rash.   Psych: Appropriate affect.    ED Results / Procedures / Treatments   Labs (all labs ordered are listed, but only abnormal results are displayed) Labs Reviewed  BASIC METABOLIC PANEL WITH GFR - Abnormal; Notable for the following components:      Result Value   Potassium 3.3 (*)    CO2 21 (*)    Glucose, Bld 104 (*)    Calcium 8.8 (*)    All other components within normal limits  CBC - Abnormal; Notable for the following components:   HCT 37.6 (*)    MCHC 37.0 (*)    All other components within normal limits  BRAIN NATRIURETIC PEPTIDE - Abnormal; Notable for the following components:   B Natriuretic Peptide 420.8 (*)    All other components within normal limits  TROPONIN I (HIGH SENSITIVITY)     EKG My EKG interpretation: Rate of 121, atrial flutter with variable rhythm consistent with his baseline.  Left axis deviation.  No acute ST elevations or depressions.   RADIOLOGY Independently interpreted chest  x-ray with no acute pathology   PROCEDURES:  Critical Care performed: No  Procedures   MEDICATIONS ORDERED IN ED: Medications - No data to display   IMPRESSION / MDM / ASSESSMENT AND PLAN / ED COURSE  I reviewed the triage vital signs and the nursing notes.                              Differential diagnosis includes, but is not limited to, CHF, A-fib, medication side effect, dependent edema  Patient's presentation is most consistent with exacerbation of chronic illness.  Patient is a 73 year old male with history of CHF presenting today for lower extremity edema.  Swelling mostly noted around the bilateral ankles with no higher up edema to the level of the calf or tibias.  Nontender to palpation throughout the lower extremity and no warmth or redness.  He has not missed any dose of his Eliquis and no concern for DVT.  He also ran out of his Lasix 1 week ago which correlates this with pitting edema related to his heart failure.   Otherwise no difficulty breathing and chest x-ray unremarkable.  Initial EKG showed elevated heart rate.  However, once assessed by me in the room he no longer has any tachycardia and consistent with his A-fib.  Able to ambulate without any distress.  No indication for additional medication to treat A-fib that is already rate controlled.  Will represcribe his Lasix and have him follow-up with the heart failure clinic within the week.  Given strict return precautions for worsening symptoms.     FINAL CLINICAL IMPRESSION(S) / ED DIAGNOSES   Final diagnoses:  Bilateral lower extremity edema     Rx / DC Orders   ED Discharge Orders          Ordered    furosemide (LASIX) 40 MG tablet        03/01/24 1820             Note:  This document was prepared using Dragon voice recognition software and may include unintentional dictation errors.   Janith Lima, MD 03/01/24 [phone]

## 2024-03-01 NOTE — Discharge Instructions (Addendum)
 I would like you to increase your Lasix dose from 40 mg to 80 mg which is doubling the dose for the next 7 days.  You should also follow-up with your heart failure clinic team so they can reassess you as well.  Please return for any severe worsening symptoms or specifically if you have any difficulty breathing.  Please call Clar[facility] or the heart failure clinic to set up your follow-up appointment.

## 2024-03-01 NOTE — ED Triage Notes (Addendum)
 Pt comes with bilateral leg/ankle swelling. Pt does have hx of afib. Pt states he noticed it yesterday. Pt denies any cp, dizziness or sob.  Pt is on thinner. Pt does also have hx of chf and on lasix.

## 2024-03-02 ENCOUNTER — Telehealth: Payer: Self-pay | Admitting: Family

## 2024-03-02 NOTE — Telephone Encounter (Signed)
 Called to sched ED f/u

## 2024-03-03 ENCOUNTER — Telehealth: Payer: Self-pay | Admitting: Family

## 2024-03-03 NOTE — Telephone Encounter (Incomplete)
 Called to confirm/remind patient of their appointment at the Advanced Heart Failure Clinic on 03/04/24.   Appointment:   [x] Confirmed  [] Left mess   [] No answer/No voice mail  [] Phone not in service  Patient reminded to bring all medications and/or complete list.  Confirmed patient has transportation. Gave directions, instructed to utilize valet parking.

## 2024-03-03 NOTE — Progress Notes (Unsigned)
 Advanced Heart Failure Clinic Note    PCP: None (used to go to [facility]) Cardiologist: Lorine Bears, MD / Eula Listen, PA (last seen 10/23)  Chief Complaint: pedal edema   HPI:  Donald Molina is a 73 y/o male with a history of atrial fibrillation/ flutter (cardioverted 07/23), HTN, pulmonary HTN (06/19), NSTEMI, remote tobacco use and chronic heart failure.   Admitted 04/2018 with A-fib and volume overload. Echo demonstrated an EF of 25 to 30%. Cardiac cath showed normal coronary arteries. Following diuresis and amiodarone loading, he underwent successful DCCV.  He had recurrent A-fib following discharge in the setting of medication noncompliance with subsequent successful cardioversion thereafter following initiation of medications.  Follow-up echo in 06/2018 showed an improved LVEF of 45 to 50%.   Admitted 07/03/2019 with dyspnea recurrent A-fib in the setting of medication noncompliance.  EF was again found to be reduced at 30 to 35% with diffuse hypokinesis.  He was diuresed and placed back on beta-blocker and apixaban   Admitted 03/2022 with volume overload and recurrent A-fib in the context of medication noncompliance.  He reported having not filled his medications for at least a year.  High-sensitivity troponin peaked at 80 with subsequent downtrend.  BNP 1383.  Echo demonstrated an EF of 25%, global hypokinesis with LVH, indeterminate LV diastolic function parameters, moderately reduced RV systolic function with normal ventricular cavity size and mildly increased ventricular wall thickness, elevated PASP estimated at 64 mmHg, moderately dilated left atrium moderate to severe mitral valve regurgitation, moderate tricuspid regurgitation, and an estimated right atrial pressure 15 mmHg.  He underwent diuresis and escalation of GDMT.  He subsequently underwent unusuccessful TEE guided DCCV with 2 shocks at 200 J.    Admitted 07/13/23 due to shortness of breath X 2 days along with PND and  orthopnea. Heart rate in the 130-150s, blood pressure significantly elevated 160/110, no hypoxia. Chest x-ray showed cardiomegaly and pulmonary congestion. Covid +. BNP 1,668. Troponin 21>20. Initially given IV cardizem bolus & gtt. Cardiology consulted. 1 dose of IV lasix given. Converted to NSR on cardizem gtt. Echo this admission showed EF 25-30% with global hypokinesis. Arrhthymia thought to have been triggered by covid. EP consulted but did not see patient while inpatient. Follow-up with EP as outpatient.   Was in the ED 11/06/23 due to worsening bilateral lower leg edema along with shortness of breath. Ran out of metoprolol as he needed refills. Admitted to hospitalist service for Afib RVR and CHF exacerbation. Started on diltiazem gtt and diuresis IV. Edema resolved and off diltiazem gtt.    Echo 07/03/2019: EF 30-35% along with mild/moderate Donald.   Echo 08/15/22: EF 50-55% with grade I DD Echo 07/13/23: EF 25-30% with mild LVH, moderately elevated PA Pressure of 57.5 mmHg, severe LAE, moderate Donald/ TR/ PR, mild dilatation of ascending aorta of 39 mm, the global longitudinal strain is abnormal (9.3%) with apical sparing. Consider cardiac amyloidosis.   He presents today for a HF follow-up visit (although hasn't been seen since 2020). He presents with a chief complaint of pedal edema. He says that this is chronic although "much better" since his recent admission. Has no other complaints and specifically denies fatigue, shortness of breath, chest pain, cough, palpitations, abdominal distention, dizziness or difficulty sleeping. Reports sleeping well on 2 pillows.   Has not taken meds yet today but plans to do so as soon as he returns home.    Previous cardiac studies:  RHC/ LHC done 05/05/18: normal coronary arteries, severely  elevated filling pressures, moderate pulmonary HTN and severely reduced cardiac output. CO was 2.98 with cardiac index of 1.47  Echo 05/03/18: EF 25-30% along with moderate Donald/TR  and moderately elevated PA pressure of 52 mm Hg.  TEE 05/07/18: EF 20-25% along with trivial AR, mild/mod Donald & TR and small pleural effusion.  Review of Systems: [y] = yes, [ ]  = no   General: Weight gain [ ] ; Weight loss [ ] ; Anorexia [ ] ; Fatigue [ ] ; Fever [ ] ; Chills [ ] ; Weakness [ ]   Cardiac: Chest pain/pressure [ ] ; Resting SOB [ ] ; Exertional SOB [ ] ; Orthopnea [ ] ; Pedal Edema [name] ]; Palpitations [ ] ; Syncope [ ] ; Presyncope [ ] ; Paroxysmal nocturnal dyspnea[ ]   Pulmonary: Cough [ ] ; Wheezing[ ] ; Hemoptysis[ ] ; Sputum [ ] ; Snoring [ ]   GI: Vomiting[ ] ; Dysphagia[ ] ; Melena[ ] ; Hematochezia [ ] ; Heartburn[ ] ; Abdominal pain [ ] ; Constipation [ ] ; Diarrhea [ ] ; BRBPR [ ]   GU: Hematuria[ ] ; Dysuria [ ] ; Nocturia[ ]   Vascular: Pain in legs with walking [ ] ; Pain in feet with lying flat [ ] ; Non-healing sores [ ] ; Stroke [ ] ; TIA [ ] ; Slurred speech [ ] ;  Neuro: Headaches[ ] ; Vertigo[ ] ; Seizures[ ] ; Paresthesias[ ] ;Blurred vision [ ] ; Diplopia [ ] ; Vision changes [ ]   Ortho/Skin: Arthritis [ ] ; Joint pain [ ] ; Muscle pain [ ] ; Joint swelling [ ] ; Back Pain [ ] ; Rash [ ]   Psych: Depression[ ] ; Anxiety[ ]   Heme: Bleeding problems [ ] ; Clotting disorders [ ] ; Anemia [ ]   Endocrine: Diabetes [ ] ; Thyroid dysfunction[ ]    Past Medical History:  Diagnosis Date   CHF (congestive heart failure) (HCC)    HFrEF (heart failure with reduced ejection fraction) (HCC)    a. 04/2018 Echo: EF 25-30%; b. 06/2018 Echo: EF 45-50%. diff HK, Gr1 DD. Nl RV fxn; c. 06/2019 Echo: EF 30-35%, diff HK, mod reduced RV fxn, mild BAE, mild-mod Donald, mild PS.   Hypertension    NICM (nonischemic cardiomyopathy) (HCC)    a. 04/2018 Echo: EF 25-30%; b. 06/2018 Echo: EF 45-50%; c. 06/2019 Echo: EF 30-35%.   Persistent atrial fibrillation (HCC)    a. Dx 04/2018 s/p DCCV and amio loading. CHA2DS2VASc 3-->eliquis.    Current Outpatient Medications  Medication Sig Dispense Refill   apixaban (ELIQUIS) 5 MG TABS tablet Take 1  tablet (5 mg total) by mouth 2 (two) times daily. 180 tablet 3   dapagliflozin propanediol (FARXIGA) 10 MG TABS tablet Take 1 tablet (10 mg total) by mouth daily before breakfast. 90 tablet 3   furosemide (LASIX) 40 MG tablet TAKE 40MG  IN THE MORNING AND 40MG  IN THE EVENING AS NEEDED 90 tablet 3   losartan (COZAAR) 25 MG tablet Take 0.5 tablets (12.5 mg total) by mouth daily. 30 tablet 0   metoprolol tartrate (LOPRESSOR) 50 MG tablet Take 1 tablet (50 mg total) by mouth 2 (two) times daily. 60 tablet 0   No current facility-administered medications for this visit.    Allergies  Allergen Reactions   Entresto [Sacubitril-Valsartan] Other (See Comments)    hypotension      Social History   Socioeconomic History   Marital status: Divorced    Spouse name: Not on file   Number of children: 2   Years of education: 12   Highest education level: 12th grade  Occupational History   Occupation: retired  Tobacco Use   Smoking status: Former    Current packs/day:  0.00    Types: Cigarettes    Start date: 05/28/1988    Quit date: 05/28/1998    Years since quitting: 25.7   Smokeless tobacco: Former    Types: Chew    Quit date: 05/28/1998  Vaping Use   Vaping status: Never Used  Substance and Sexual Activity   Alcohol use: Yes    Alcohol/week: 12.0 standard drinks of alcohol    Types: 12 Cans of beer per week   Drug use: Never   Sexual activity: Yes    Birth control/protection: Condom  Other Topics Concern   Not on file  Social History Narrative   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   Social Drivers of Corporate investment banker Strain: Low Risk  (08/12/2019)   Received from [facility], [facility]   Overall Financial Resource Strain (CARDIA)    Difficulty of Paying Living Expenses: Not hard at all  Food Insecurity: No Food Insecurity (07/13/2023)   Hunger Vital Sign    Worried About Running Out of Food in the Last Year: Never true    Ran Out of  Food in the Last Year: Never true  Transportation Needs: No Transportation Needs (07/13/2023)   PRAPARE - Administrator, Civil Service (Medical): No    Lack of Transportation (Non-Medical): No  Physical Activity: Insufficiently Active (05/28/2018)   Exercise Vital Sign    Days of Exercise per Week: 7 days    Minutes of Exercise per Session: 20 min  Stress: No Stress Concern Present (05/28/2018)   [employer] of Occupational Health - Occupational Stress Questionnaire    Feeling of Stress : Not at all  Social Connections: Not on file  Intimate Partner Violence: Not At Risk (07/13/2023)   Humiliation, Afraid, Rape, and Kick questionnaire    Fear of Current or Ex-Partner: No    Emotionally Abused: No    Physically Abused: No    Sexually Abused: No      Family History  Problem Relation Age of Onset   Cancer Mother        pancreatic   Cervical cancer Mother    Dementia Father    Heart failure Father    Heart disease Sister    There were no vitals filed for this visit.  Wt Readings from Last 3 Encounters:  03/01/24 172 lb (78 kg)  11/15/23 173 lb 12.8 oz (78.8 kg)  11/06/23 172 lb (78 kg)   Lab Results  Component Value Date   CREATININE 1.04 03/01/2024   CREATININE 0.90 11/15/2023   CREATININE 0.87 11/07/2023   PHYSICAL EXAM: General:  Well appearing. No respiratory difficulty HEENT: normal Neck: supple. no JVD. No lymphadenopathy or thyromegaly appreciated. Cor: PMI nondisplaced. Regular rate & irregular rhythm. No rubs, gallops or murmurs. Lungs: clear Abdomen: soft, nontender, nondistended. No hepatosplenomegaly. No bruits or masses.  Extremities: no cyanosis, clubbing, rash, edema Neuro: alert & oriented x 3, cranial nerves grossly intact. moves all 4 extremities w/o difficulty. Affect pleasant.  ECG: AF with HR 95 (personally reviewed)   ASSESSMENT & PLAN:  1: NICM with reduced ejection fraction- - suspect due to ? pHTN, amyloid, persistent atrial  fibrillation - NYHA class II - euvolemic today - begin weighing daily and call for an overnight weight gain of >2 pounds or a weekly weight gain of >5 pounds - Echo 08/15/22: EF 50-55% with grade I DD - Echo 07/13/23: EF 25-30% with mild LVH, moderately elevated  PA Pressure of 57.5 mmHg, severe LAE, moderate Donald/ TR/ PR, mild dilatation of ascending aorta of 39 mm, the global longitudinal strain is abnormal (9.3%) with apical sparing. Consider cardiac amyloidosis.  - will schedule cMRI to evaluate for amyloid - begin farxiga 10mg  daily & do BMET next visit - with starting farxiga, decrease furosemide to 40mg  daily with additional 40mg  PRN  - continue losartan 12.5mg  daily; entresto caused hypotension in the past - continue metoprolol tartrate 50mg  BID - saw cardiology ([facility]) 10/23; needs t/u scheduled - BMET today - BNP 11/06/23 was 1637.0  2: HTN- - BP 155/90 - has seen PCP @ [facility] in the past; emphasized that he get a follow-up appointment scheduled - BMP 11/07/23 reviewed and showed sodium 135, potassium 3.8, creatinine 0.87 and GFR >60.  - BMET today  3: Persistent Atrial fibrillation/ flutter- - cardioverted 07/23 - EKG today shows AF - continue apixaban 5mg  BID - continue metoprololl tartrate 50mg  BID  4: CAD- - RHC/ LHC done 05/05/18: normal coronary arteries, severely elevated filling pressures, moderate pulmonary HTN and severely reduced cardiac output. CO was 2.98 with cardiac index of 1.47 - LDL 08/04/20 was 119 - lipid panel today   Return in 3-4 weeks, sooner if needed. Will have follow-up with HF MD after cMRI done.

## 2024-03-04 ENCOUNTER — Ambulatory Visit: Attending: Family | Admitting: Family

## 2024-03-04 ENCOUNTER — Encounter: Payer: Self-pay | Admitting: Family

## 2024-03-04 VITALS — BP 153/91 | HR 62 | Wt 180.2 lb

## 2024-03-04 DIAGNOSIS — I428 Other cardiomyopathies: Secondary | ICD-10-CM | POA: Insufficient documentation

## 2024-03-04 DIAGNOSIS — I10 Essential (primary) hypertension: Secondary | ICD-10-CM

## 2024-03-04 DIAGNOSIS — I4819 Other persistent atrial fibrillation: Secondary | ICD-10-CM | POA: Diagnosis not present

## 2024-03-04 DIAGNOSIS — Z7901 Long term (current) use of anticoagulants: Secondary | ICD-10-CM | POA: Diagnosis not present

## 2024-03-04 DIAGNOSIS — I509 Heart failure, unspecified: Secondary | ICD-10-CM | POA: Diagnosis present

## 2024-03-04 DIAGNOSIS — I252 Old myocardial infarction: Secondary | ICD-10-CM | POA: Insufficient documentation

## 2024-03-04 DIAGNOSIS — I5022 Chronic systolic (congestive) heart failure: Secondary | ICD-10-CM | POA: Diagnosis not present

## 2024-03-04 DIAGNOSIS — I2510 Atherosclerotic heart disease of native coronary artery without angina pectoris: Secondary | ICD-10-CM

## 2024-03-04 DIAGNOSIS — I4892 Unspecified atrial flutter: Secondary | ICD-10-CM | POA: Diagnosis not present

## 2024-03-04 DIAGNOSIS — I272 Pulmonary hypertension, unspecified: Secondary | ICD-10-CM | POA: Diagnosis not present

## 2024-03-04 DIAGNOSIS — I110 Hypertensive heart disease with heart failure: Secondary | ICD-10-CM | POA: Diagnosis not present

## 2024-03-04 DIAGNOSIS — Z87891 Personal history of nicotine dependence: Secondary | ICD-10-CM | POA: Insufficient documentation

## 2024-03-04 MED ORDER — LOSARTAN POTASSIUM 25 MG PO TABS
12.5000 mg | ORAL_TABLET | Freq: Every day | ORAL | 3 refills | Status: DC
Start: 2024-03-04 — End: 2024-05-26

## 2024-03-04 MED ORDER — SPIRONOLACTONE 25 MG PO TABS: 25.0000 mg | ORAL_TABLET | Freq: Every day | ORAL | 3 refills | Status: AC

## 2024-03-04 NOTE — Patient Instructions (Signed)
 Medication Changes:  START SPIRONOLACTONE 25 MG ONCE DAILY  DECREASE YOUR LASIX TO 40 MG ONCE DAILY. TAKE AN ADDITIONAL 40 MG IF NEEDED FOR SWELLING OR WEIGHT GAIN.   Testing/Procedures:  Your physician has requested that you have a cardiac MRI. Cardiac MRI uses a computer to create images of your heart as its beating, producing both still and moving pictures of your heart and major blood vessels. For further information please visit [email or web address]. Please follow the instruction sheet given to you today for more information.    Referrals:  We have placed a referral today to General Cardiology for your Atrial Fibrillation. They should reach out to you within 1-2 weeks. If they do not, please reach out to them. Their information is below on this AVS.   Follow-Up in: 1 week with Clarisa Kindred, FNP.  At the [facility], you and your health needs are our priority. We have a designated team specialized in the treatment of Heart Failure. This Care Team includes your primary Heart Failure Specialized Cardiologist (physician), Advanced Practice Providers (APPs- Physician Assistants and Nurse Practitioners), and Pharmacist who all work together to provide you with the care you need, when you need it.   You may see any of the following providers on your designated Care Team at your next follow up:  Dr. Arvilla Meres Dr. Marca Ancona Dr. Dorthula Nettles Dr. Theresia Bough Clarisa Kindred, FNP Enos Fling, RPH-CPP  Please be sure to bring in all your medications bottles to every appointment.   Need to Contact us:  If you have any questions or concerns before your next appointment please send [place] a message through [place] or call our office at [phone].    TO LEAVE A MESSAGE FOR THE NURSE SELECT OPTION 2, PLEASE LEAVE A MESSAGE INCLUDING: YOUR NAME DATE OF BIRTH CALL BACK NUMBER REASON FOR CALL**this is important as we prioritize the call backs  YOU WILL RECEIVE A  CALL BACK THE SAME DAY AS LONG AS YOU CALL BEFORE 4:00 PM

## 2024-03-10 ENCOUNTER — Telehealth: Payer: Self-pay | Admitting: Family

## 2024-03-10 NOTE — Progress Notes (Signed)
 Medication sent to pharmacy

## 2024-03-10 NOTE — Progress Notes (Deleted)
 Advanced Heart Failure Clinic Note    PCP: None (used to go to [facility]) Cardiologist: Antionette Kirks, MD / Varney Gentleman, PA (last seen 10/23)  Chief Complaint: HF visit   HPI:  [name] is a 73 y/o male with a history of atrial fibrillation/ flutter (cardioverted 07/23), HTN, pulmonary HTN (06/19), NSTEMI, remote tobacco use and chronic heart failure.   Admitted 04/2018 with A-fib and volume overload. Echo demonstrated an EF of 25 to 30%. Cardiac cath showed normal coronary arteries. Following diuresis and amiodarone loading, he underwent successful DCCV.  He had recurrent A-fib following discharge in the setting of medication noncompliance with subsequent successful cardioversion thereafter following initiation of medications.  Follow-up echo in 06/2018 showed an improved LVEF of 45 to 50%.   Admitted 07/03/2019 with dyspnea recurrent A-fib in the setting of medication noncompliance.  EF was again found to be reduced at 30 to 35% with diffuse hypokinesis.  He was diuresed and placed back on beta-blocker and apixaban   Admitted 03/2022 with volume overload and recurrent A-fib in the context of medication noncompliance.  He reported having not filled his medications for at least a year.  High-sensitivity troponin peaked at 80 with subsequent downtrend.  BNP 1383.  Echo demonstrated an EF of 25%, global hypokinesis with LVH, indeterminate LV diastolic function parameters, moderately reduced RV systolic function with normal ventricular cavity size and mildly increased ventricular wall thickness, elevated PASP estimated at 64 mmHg, moderately dilated left atrium moderate to severe mitral valve regurgitation, moderate tricuspid regurgitation, and an estimated right atrial pressure 15 mmHg.  He underwent diuresis and escalation of GDMT.  He subsequently underwent unusuccessful TEE guided DCCV with 2 shocks at 200 J.  Echo 08/15/22: EF 50-55% with grade I DD    Admitted 07/13/23 due to shortness of  breath X 2 days along with PND and orthopnea. Heart rate in the 130-150s, blood pressure significantly elevated 160/110, no hypoxia. Chest x-ray showed cardiomegaly and pulmonary congestion. Covid +. BNP 1,668. Troponin 21>20. Initially given IV cardizem bolus & gtt. Cardiology consulted. 1 dose of IV lasix given. Converted to NSR on cardizem gtt. Echo 07/13/23: EF 25-30% with mild LVH, moderately elevated PA Pressure of 57.5 mmHg, severe LAE, moderate MR/ TR/ PR, mild dilatation of ascending aorta of 39 mm, the global longitudinal strain is abnormal (9.3%) with apical sparing. Consider cardiac amyloidosis. Arrhythmia thought to have been triggered by covid. EP consulted but did not see patient while inpatient. Follow-up with EP as outpatient.   Was in the ED 11/06/23 due to worsening bilateral lower leg edema along with shortness of breath. Ran out of metoprolol as he needed refills. Admitted to hospitalist service for Afib RVR and CHF exacerbation. Started on diltiazem gtt and diuresis IV. Edema resolved and off diltiazem gtt.    Was in the ED 03/01/24 with leg swelling after running out of lasix for the preceding week. CXR negative. Lasix provided and he was released.   He presents today with a chief complaint of a heart failure visit. Currently has no symptoms and specifically denies fatigue, shortness of breath, chest pain, palpitations, cough, abdominal distention, pedal edema, dizziness or difficulty sleeping. Has been out of losartan for ~ 1 week.    Previous cardiac studies:  RHC/ LHC done 05/05/18: normal coronary arteries, severely elevated filling pressures, moderate pulmonary HTN and severely reduced cardiac output. CO was 2.98 with cardiac index of 1.47  Echo 05/03/18: EF 25-30% along with moderate MR/TR and moderately elevated  PA pressure of 52 mm Hg.  TEE 05/07/18: EF 20-25% along with trivial AR, mild/mod MR & TR and small pleural effusion.  ROS: All systems negative except what is listed  in HPI, PMH and Problem List   Past Medical History:  Diagnosis Date   CHF (congestive heart failure) (HCC)    HFrEF (heart failure with reduced ejection fraction) (HCC)    a. 04/2018 Echo: EF 25-30%; b. 06/2018 Echo: EF 45-50%. diff HK, Gr1 DD. Nl RV fxn; c. 06/2019 Echo: EF 30-35%, diff HK, mod reduced RV fxn, mild BAE, mild-mod MR, mild PS.   Hypertension    NICM (nonischemic cardiomyopathy) (HCC)    a. 04/2018 Echo: EF 25-30%; b. 06/2018 Echo: EF 45-50%; c. 06/2019 Echo: EF 30-35%.   Persistent atrial fibrillation (HCC)    a. Dx 04/2018 s/p DCCV and amio loading. CHA2DS2VASc 3-->eliquis.    Current Outpatient Medications  Medication Sig Dispense Refill   apixaban (ELIQUIS) 5 MG TABS tablet Take 1 tablet (5 mg total) by mouth 2 (two) times daily. 180 tablet 3   dapagliflozin propanediol (FARXIGA) 10 MG TABS tablet Take 1 tablet (10 mg total) by mouth daily before breakfast. 90 tablet 3   furosemide (LASIX) 40 MG tablet Take 40 mg by mouth daily. With additional 40mg  PRN     losartan (COZAAR) 25 MG tablet Take 0.5 tablets (12.5 mg total) by mouth daily. 45 tablet 3   metoprolol tartrate (LOPRESSOR) 50 MG tablet Take 1 tablet (50 mg total) by mouth 2 (two) times daily. 60 tablet 0   spironolactone (ALDACTONE) 25 MG tablet Take 1 tablet (25 mg total) by mouth daily. 90 tablet 3   No current facility-administered medications for this visit.    Allergies  Allergen Reactions   Entresto [Sacubitril-Valsartan] Other (See Comments)    hypotension      Social History   Socioeconomic History   Marital status: Divorced    Spouse name: Not on file   Number of children: 2   Years of education: 17   Highest education level: 12th grade  Occupational History   Occupation: retired  Tobacco Use   Smoking status: Former    Current packs/day: 0.00    Types: Cigarettes    Start date: 05/28/1988    Quit date: 05/28/1998    Years since quitting: 25.8   Smokeless tobacco: Former    Types: Chew     Quit date: 05/28/1998  Vaping Use   Vaping status: Never Used  Substance and Sexual Activity   Alcohol use: Yes    Alcohol/week: 12.0 standard drinks of alcohol    Types: 12 Cans of beer per week   Drug use: Never   Sexual activity: Yes    Birth control/protection: Condom  Other Topics Concern   Not on file  Social History Narrative   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   Social Drivers of Corporate investment banker Strain: Low Risk  (08/12/2019)   Received from [facility], [facility]   Overall Financial Resource Strain (CARDIA)    Difficulty of Paying Living Expenses: Not hard at all  Food Insecurity: No Food Insecurity (07/13/2023)   Hunger Vital Sign    Worried About Running Out of Food in the Last Year: Never true    Ran Out of Food in the Last Year: Never true  Transportation Needs: No Transportation Needs (07/13/2023)   PRAPARE - Administrator, Civil Service (Medical):  No    Lack of Transportation (Non-Medical): No  Physical Activity: Insufficiently Active (05/28/2018)   Exercise Vital Sign    Days of Exercise per Week: 7 days    Minutes of Exercise per Session: 20 min  Stress: No Stress Concern Present (05/28/2018)   [employer] of Occupational Health - Occupational Stress Questionnaire    Feeling of Stress : Not at all  Social Connections: Not on file  Intimate Partner Violence: Not At Risk (07/13/2023)   Humiliation, Afraid, Rape, and Kick questionnaire    Fear of Current or Ex-Partner: No    Emotionally Abused: No    Physically Abused: No    Sexually Abused: No      Family History  Problem Relation Age of Onset   Cancer Mother        pancreatic   Cervical cancer Mother    Dementia Father    Heart failure Father    Heart disease Sister    There were no vitals filed for this visit.  Wt Readings from Last 3 Encounters:  03/04/24 180 lb 3.2 oz (81.7 kg)  03/01/24 172 lb (78 kg)  11/15/23 173 lb 12.8 oz  (78.8 kg)   Lab Results  Component Value Date   CREATININE 1.04 03/01/2024   CREATININE 0.90 11/15/2023   CREATININE 0.87 11/07/2023    PHYSICAL EXAM: General:  Well appearing. No respiratory difficulty HEENT: normal Neck: supple. no JVD. No lymphadenopathy or thyromegaly appreciated. Cor: PMI nondisplaced. Regular rate & irregular rhythm. No rubs, gallops or murmurs. Lungs: clear Abdomen: soft, nontender, nondistended. No hepatosplenomegaly. No bruits or masses.  Extremities: no cyanosis, clubbing, rash, trace pitting edema bilateral lower legs Neuro: alert & oriented x 3, cranial nerves grossly intact. moves all 4 extremities w/o difficulty. Affect pleasant.  ECG: AF RVR with HR 116 (personally reviewed)   ASSESSMENT & PLAN:  1: NICM with reduced ejection fraction- - suspect due to ? pHTN, amyloid, persistent atrial fibrillation - NYHA class I - euvolemic today - weight up 7 pounds from last visit here 4 months ago - Echo 08/15/22: EF 50-55% with grade I DD - Echo 07/13/23: EF 25-30% with mild LVH, moderately elevated PA Pressure of 57.5 mmHg, severe LAE, moderate MR/ TR/ PR, mild dilatation of ascending aorta of 39 mm, the global longitudinal strain is abnormal (9.3%) with apical sparing. Consider cardiac amyloidosis.  - will schedule cMRI to evaluate for amyloid - continue farxiga 10mg  daily  - decrease furosemide 40mg  daily with additional 40mg  PRN since adding spiro today - continue losartan 12.5mg  daily; entresto caused hypotension in the past - continue metoprolol tartrate 50mg  BID - begin spironolactone 25mg  daily; BMET next week - BNP 03/01/24 was 420.8  2: HTN- - BP 153/91 - starting spiro 25mg  daily - has seen PCP @ [facility] in the past; discussed, again, the importance of getting back in with [facility] if that's where he wants to stay - BMP 03/01/24 reviewed: sodium 138, potassium 3.3, creatinine 1.04 and GFR >60.  - BMET next visit  3: Persistent Atrial  fibrillation/ flutter- - cardioverted 07/23 - EKG today is AF RVR - saw cardiology ([facility]) 10/23; needs t/u scheduled so cardiology referral placed - continue apixaban 5mg  BID - continue metoprololl tartrate 50mg  BID; may need to titrate this at next visit  4: CAD- - RHC/ LHC done 05/05/18: normal coronary arteries, severely elevated filling pressures, moderate pulmonary HTN and severely reduced cardiac output. CO was 2.98 with cardiac index of 1.47 -  LDL 11/15/23 was 84   Return in 1 week, sooner if needed.   Shawnee Dellen, [place] 03/04/24

## 2024-03-10 NOTE — Telephone Encounter (Incomplete)
 Called to confirm/remind patient of their appointment at the [facility] on 03/11/24***.   Appointment:   [] Confirmed  [] Left mess   [x] No answer/No voice mail  [] Phone not in service  Patient reminded to bring all medications and/or complete list.  Confirmed patient has transportation. Gave directions, instructed to utilize valet parking.

## 2024-03-11 ENCOUNTER — Telehealth: Payer: Self-pay | Admitting: Family

## 2024-03-11 ENCOUNTER — Encounter: Admitting: Family

## 2024-03-11 NOTE — Telephone Encounter (Signed)
 Patient did not show for his [facility] appointment on 03/11/24.

## 2024-03-11 NOTE — Progress Notes (Incomplete)
 [facility] - HEART FAILURE CLINIC - PHARMACIST COUNSELING NOTE  Adherence Assessment  Do you ever forget to take your medication? [] Yes [] No  Do you ever skip doses due to side effects? [] Yes [] No  Do you have trouble affording your medicines? [] Yes [] No  Are you ever unable to pick up your medication due to transportation difficulties? [] Yes [] No  Do you ever stop taking your medications because you don't believe they are helping? [] Yes [] No  Do you check your weight daily? [] Yes [] No  Do you check your blood pressure daily? [] Yes [] No  Adherence strategy:   Barriers to obtaining medications:     Vital signs: HR ***, BP ***, weight (pounds) *** ECHO: Date ***, EF ***, notes *** Cath: Date ***, EF ***, notes *** Renal function: Date ***, GFR ***  Current Guideline-Directed Medical Therapy/Evidence Based Medicine  ACE/ARB/ARNI: {AR_ARNI/ACEi/ARB:25599} Target dose:  Beta Blocker: {AR_Beta blocker:25598} Target dose:  Aldosterone Antagonist: {AR_Mineralocorticoid receptor antagonists:25602} Target dose:  SGLT2i: {AR_SGLT2i:25603} Target dose:   Diuretic: {AR_Diuretics:25604}  Others:   ASSESSMENT *** year old {Gender Description:210950033} who presents to the HF clinic ***. PMH is significant for ***.   Recent ED visit or hospitalization (past 6 months): Date - ***, CC - *** , Admission Dx (if applicable) - ***    COUNSELING POINTS/CLINICAL PEARLS  Can use the HFCMEDCOUNSELING dot phrase here   PLAN    Time spent: *** minutes

## 2024-03-31 ENCOUNTER — Ambulatory Visit: Attending: Medical | Admitting: Medical

## 2024-03-31 ENCOUNTER — Encounter: Payer: Self-pay | Admitting: Medical

## 2024-03-31 ENCOUNTER — Telehealth: Payer: Self-pay | Admitting: Family

## 2024-03-31 VITALS — BP 132/88 | HR 106 | Wt 180.6 lb

## 2024-03-31 DIAGNOSIS — I43 Cardiomyopathy in diseases classified elsewhere: Secondary | ICD-10-CM

## 2024-03-31 DIAGNOSIS — Z01810 Encounter for preprocedural cardiovascular examination: Secondary | ICD-10-CM

## 2024-03-31 DIAGNOSIS — I10 Essential (primary) hypertension: Secondary | ICD-10-CM | POA: Diagnosis not present

## 2024-03-31 DIAGNOSIS — E854 Organ-limited amyloidosis: Secondary | ICD-10-CM

## 2024-03-31 DIAGNOSIS — I428 Other cardiomyopathies: Secondary | ICD-10-CM

## 2024-03-31 DIAGNOSIS — I4819 Other persistent atrial fibrillation: Secondary | ICD-10-CM | POA: Diagnosis not present

## 2024-03-31 DIAGNOSIS — I5022 Chronic systolic (congestive) heart failure: Secondary | ICD-10-CM | POA: Diagnosis not present

## 2024-03-31 MED ORDER — APIXABAN 5 MG PO TABS: 5.0000 mg | ORAL_TABLET | Freq: Two times a day (BID) | ORAL | 3 refills | Status: AC

## 2024-03-31 NOTE — Patient Instructions (Signed)
 Medication Instructions:  Your physician recommends that you continue on your current medications as directed. Please refer to the Current Medication list given to you today.  *If you need a refill on your cardiac medications before your next appointment, please call your pharmacy*  Lab Work: Your provider would like for you to have following labs drawn today BMET - TODAY.   If you have labs (blood work) drawn today and your tests are completely normal, you will receive your results only by: MyChart Message (if you have MyChart) OR A paper copy in the mail If you have any lab test that is abnormal or we need to change your treatment, we will call you to review the results.  Testing/Procedures:   Will reach out to schedule Cardiac MRI    Follow-Up: At [facility], you and your health needs are our priority.  As part of our continuing mission to provide you with exceptional heart care, our providers are all part of one team.  This team includes your primary Cardiologist (physician) and Advanced Practice Providers or APPs (Physician Assistants and Nurse Practitioners) who all work together to provide you with the care you need, when you need it.  Your next appointment:   2-3  month(s)  Provider:   You may see Antionette Kirks, MD or one of the following Advanced Practice Providers on your designated Care Team:   Laneta Pintos, NP Gildardo Labrador, PA-C Varney Gentleman, PA-C Cadence [place], PA-C Ronald Cockayne, NP Morey Ar, NP    We recommend signing up for the patient portal called "MyChart".  Sign up information is provided on this After Visit Summary.  MyChart is used to connect with patients for Virtual Visits (Telemedicine).  Patients are able to view lab/test results, encounter notes, upcoming appointments, etc.  Non-urgent messages can be sent to your provider as well.   To learn more about what you can do with MyChart, go to [email or web address].   Other  Instructions A referral for EP has been placed , Please schedule patient with Dr Daneil Dunker  on Apr 14, 2024

## 2024-03-31 NOTE — Telephone Encounter (Signed)
 Called to get patient rescheduled for missed appt

## 2024-03-31 NOTE — Progress Notes (Signed)
 Cardiology Office Note:  .   Date:  03/31/2024  ID:  Shasta Deist, DOB 07/29/1951, MRN [id number] PCP: Juvenal Opoka  [facility] Providers Cardiologist:  Antionette Kirks, MD {  History of Present Illness: .   Cole Furrh is a 73 y.o. male normal coronary arteries by LHC in 04/2018, HFrEF secondary to NICM, persistent Afib s/p DCCV in 04/2022, HTN, and medication noncompliance leading to recurrent hospital admissions who presents for follow-up for CM and Afib.   He was admitted with Afib and volume overload. Echo demonstrated an EF 25-30%. Cardiac cath showed normal coronary arteries. Following diuresis and amiodarone  loading, he underwent successful DCCV. He had recurrent Afib following discharge in the setting of medication noncompliance with with subsequent successful cardioversion thereafter following initiation of medications. Follow-up echo in 06/2018 showed an improved LVEF 45-50%. 06/2019, he was readmitted with dyspnea recurrent Afib in the setting of medication noncomplaince. EF was found to be reduced at 30-35% with diffuse HK. He was diuresed and placed back on BB and Eliquis  with plan for rhythm management in the outpatient setting. He was seen in the office 07/2020 and was noted to be n SR. He was lost to follow-up.   He was admitted to the hospital in 03/2022 with volume overload and recurrent A-fib in the context of medication noncompliance.  He reported having not filled his medications for at least a year.  High-sensitivity troponin peaked at 80 with subsequent downtrend.  BNP 1383.  Echo demonstrated an EF of 25%, global hypokinesis with LVH, indeterminate LV diastolic function parameters, moderately reduced RV systolic function with normal ventricular cavity size and mildly increased ventricular wall thickness, elevated PASP estimated at 64 mmHg, moderately dilated left atrium moderate to severe mitral valve regurgitation, moderate tricuspid regurgitation, and an estimated right  atrial pressure 15 mmHg.  He underwent diuresis and escalation of GDMT.  He subsequently underwent unusuccessful TEE guided DCCV with 2 shocks at 200 J.  Following this, it was recommended he be loaded with amiodarone  with plans for repeat attempt of DCCV in the outpatient setting.   He was seen in hospital follow-up on 04/16/2022 and was without symptoms of angina or decompensation.  His weight remained stable by his home scale at 168 pounds.  He reported adherence to medications.  We were planning to escalate GDMT based on updated renal function/electrolytes.  Labs obtained at that time showed mild AKI with recommendation to decrease furosemide  and KCl and defer escalation of GDMT until labs could be trended.  However, we were unable to get in contact with the patient, therefore a letter was sent to him.   He was seen in the office on 05/10/2022 and was without symptoms of angina or decompensation.  He was adherent to his medications.  Escalation of GDMT was deferred given concerns regarding follow-up of labs, as we had been unable to get in touch with him via phone previously.  He underwent successful DCCV on 05/17/2022.  He was seen in the office 06/2022 and remained wtihout symptoms. Echo 07/2022 demonstrated and improvement in his LVSF with an EF 50-55%, no WMA, G1DD, normal RVSF. Toprol  reduced for bradycardia.   The patient was admitted 06/2023 with recurrent Afib in the setting of noncompliance found to have reduced EF. He converted to NSR with IV dilt. Echo showed LVEF 25-30%, global HK, severe LAE, moderate MR and TR. The patient was diuresed. Plan was for OP follow-up with EP, but he was lost follow-up  Today, the patient  went to the dentist to get his teeth pulled but procedure was canceled due to being on Eliquis . Today, the patient is in Aflutter with a HR of 106bpm. He denies chest pain or shortness of breath. No palpitations or heart racing. No lower leg edema.   He can walk 1-2 blocks, and up  2 flights of stairs. He lives with his brother. He can do heavy yard work. He denies missing Eliquis .    Studies Reviewed: Aaron Aas   EKG Interpretation Date/Time:  Tuesday Mar 31 2024 08:15:32 EDT Ventricular Rate:  106 PR Interval:    QRS Duration:  116 QT Interval:  366 QTC Calculation: 486 R Axis:   -49  Text Interpretation: Atrial fibrillation with rapid ventricular response Left anterior fascicular block Left ventricular hypertrophy with QRS widening ( R in aVL , Sokolow-Lyon , Cornell product ) Nonspecific ST and T wave abnormality When compared with ECG of 04-Mar-2024 14:25, ST now depressed in Anterior leads T wave inversion now evident in Anterior leads Confirmed by Gennaro Khat, Ercole Georg ([id number]) on 03/31/2024 8:23:23 AM     Echo 06/2023 1. Left ventricular ejection fraction, by estimation, is 25 to 30%. The  left ventricle has severely decreased function. The left ventricle  demonstrates global hypokinesis. There is mild left ventricular  hypertrophy of the basal-septal segment. Left  ventricular diastolic function could not be evaluated. The average left  ventricular global longitudinal strain is -9.3 %. The global longitudinal  strain is abnormal with apical sparing. Consider cardiac amyloidosis.   2. Right ventricular systolic function is severely reduced. The right  ventricular size is mildly enlarged. There is moderately elevated  pulmonary artery systolic pressure. The estimated right ventricular  systolic pressure is 57.5 mmHg.   3. Left atrial size was severely dilated.   4. The mitral valve is normal in structure. Moderate mitral valve  regurgitation. No evidence of mitral stenosis.   5. Tricuspid valve regurgitation is moderate.   6. The aortic valve is tricuspid. Aortic valve regurgitation is trivial.  Aortic valve sclerosis/calcification is present, without any evidence of  aortic stenosis. Aortic valve area, by VTI measures 2.69 cm. Aortic valve  mean gradient measures 1.0  mmHg.   Aortic valve Vmax measures 0.76 m/s.   7. Pulmonic valve regurgitation is moderate.   8. Aortic dilatation noted. There is mild dilatation of the ascending  aorta, measuring 39 mm.   9. The inferior vena cava is dilated in size with <50% respiratory  variability, suggesting right atrial pressure of 15 mmHg.      Echo 07/2022 1. Left ventricular ejection fraction, by estimation, is 50 to 55%. The  left ventricle has low normal function. The left ventricle has no regional  wall motion abnormalities. Left ventricular diastolic parameters are  consistent with Grade I diastolic  dysfunction (impaired relaxation). The average left ventricular global  longitudinal strain is -15.3 %.   2. Right ventricular systolic function is normal. The right ventricular  size is normal. Tricuspid regurgitation signal is inadequate for assessing  PA pressure.   3. The mitral valve is normal in structure. No evidence of mitral valve  regurgitation. No evidence of mitral stenosis.   4. The aortic valve is tricuspid. Aortic valve regurgitation is not  visualized. No aortic stenosis is present.   5. The inferior vena cava is normal in size with greater than 50%  respiratory variability, suggesting right atrial pressure of 3 mmHg.   6. bradycardia noted, rate 44 bpm   Comparison(s): Previous  LVEF was reported as 20-25%.      Echo 03/2022 1. Left ventricular ejection fraction, by estimation, is 25%. The left  ventricle has severely decreased function. The left ventricle demonstrates  global hypokinesis. There is mild left ventricular hypertrophy. Left  ventricular diastolic parameters are  indeterminate.   2. Right ventricular systolic function is moderately reduced. The right  ventricular size is normal. Mildly increased right ventricular wall  thickness. There is severely elevated pulmonary artery systolic pressure.  The estimated right ventricular  systolic pressure is 64.0 mmHg.   3. Left  atrial size was moderately dilated.   4. The mitral valve is normal in structure. Moderate to severe mitral  valve regurgitation. No evidence of mitral stenosis.   5. Tricuspid valve regurgitation is moderate.   6. The aortic valve is tricuspid. Aortic valve regurgitation is not  visualized. No aortic stenosis is present.   7. The inferior vena cava is dilated in size with <50% respiratory  variability, suggesting right atrial pressure of 15 mmHg.    Echo 2020 1. The left ventricle has moderate-severely reduced systolic function,  with an ejection fraction of 30-35%. The cavity size was mild to  moderately dilated. Findings are consistent with dilated cardiomyopathy.  Left ventricular diastolic parameters were  normal. Left ventricular diffuse hypokinesis.   2. The right ventricle has moderately reduced systolic function. The  cavity was moderately enlarged. There is no increase in right ventricular  wall thickness.   3. Left atrial size was mildly dilated.   4. Right atrial size was mildly dilated.   5. The mitral valve is grossly normal. Mild thickening of the mitral  valve leaflet. Mitral valve regurgitation is mild to moderate by color  flow Doppler.   6. The aortic valve is grossly normal.   7. Mild valvular pulmonic stenosis.   8. The aorta is normal in size and structure.   9. The interatrial septum was not assessed.    R/L heart cath 2019 1.  Normal coronary arteries. 2.  Severely reduced LV systolic function by echo.  Left ventricular angiography was not performed. 3.  Right heart catheterization showed severely elevated filling pressures, moderate pulmonary hypertension and severely reduced cardiac output.  Cardiac output was 2.98 with a cardiac index of 1.47.   Recommendations: The patient has nonischemic cardiomyopathy likely tachycardia induced.  He continues to be significantly volume overloaded and I recommend continuing IV diuresis. Resume heparin  drip 8 hours after  sheath pull.  A DOAC can be started tomorrow.  I recommend TEE guided cardioversion before hospital discharge given degree of cardiomyopathy.  This can likely be done on Wednesday once volume overload improves.  I am going to start the patient on oral amiodarone  to facilitate cardioversion.       Physical Exam:   VS:  BP 132/88   Pulse (!) 106   Wt 180 lb 9.6 oz (81.9 kg)   SpO2 97%   BMI 26.67 kg/m    Wt Readings from Last 3 Encounters:  03/31/24 180 lb 9.6 oz (81.9 kg)  03/04/24 180 lb 3.2 oz (81.7 kg)  03/01/24 172 lb (78 kg)    GEN: Well nourished, well developed in no acute distress NECK: No JVD; No carotid bruits CARDIAC: Reg Irreg, no murmurs, rubs, gallops RESPIRATORY:  Clear to auscultation without rales, wheezing or rhonchi  ABDOMEN: Soft, non-tender, non-distended EXTREMITIES:  No edema; No deformity   ASSESSMENT AND PLAN: .    Persistent Aflutter He has a long  history of A-fib/flutter with multiple admissions in the past due to noncompliance.  Most recently he was seen in August 2024 in the hospital with A-fib RVR and reduced EF.  He converted on IV Cardizem , however this was not continued due to low EF.  Echo at the time showed EF 25 to 30%.  Amio previously stopped in the past due to bradycardia.  Plan was for outpatient EP follow-up, however he did not follow-up.  The patient is needing his teeth pulled, however he is on Eliquis  and the dentist office said they need it held-they need to send in an official ore-op request. He is in A flutter today with HR 106bpm.  Patient is overall asymptomatic.  He denies missed doses of Eliquis .  I will refer to EP, which patient is agreeable to see. HE may need AA and repeat cardioversion.  NICM He has a long history of reduced EF.   Heart cath in 2019 showed normal coronaries.Echo in August 2024 showed EF of 25 to 30% in the setting of rapid a flutter.  Echo also showed severely reduced RV SF, possible cardiac amyloidosis, severely  dilated left atrium, moderate MR, moderate TR, trivial AI, moderate PR, mild dilation of the ascending aorta noted.  The patient appears euvolemic on exam.  Continue Farxiga  10 mg daily, Lasix  40 mg daily, losartan  0.5 mg daily, and spironolactone  25 mg daily.  I will updated be met today.  Cardiac MRI has been ordered to assess for cardiac amyloidosis, we will schedule this.  He follows with heart failure clinic.  HTN BP normal, continue current medications.  Pre-operative exam The patient is needing teeth pulled and they are needing Eliquis  held. Dentist needs to send in official pre-op request. METS>4. Need to address Aflutter at this time in case we need DCCV and uninterrupted a/c for a length of time. We will readdress at follow-up.     Dispo: Follow-up in 2-3 months  Signed, Tyriana Helmkamp Rebekah Canada, PA-C

## 2024-04-01 ENCOUNTER — Encounter: Payer: Self-pay | Admitting: Emergency Medicine

## 2024-04-01 LAB — BASIC METABOLIC PANEL WITH GFR
BUN/Creatinine Ratio: 14 (ref 10–24)
BUN: 14 mg/dL (ref 8–27)
CO2: 18 mmol/L — ABNORMAL LOW (ref 20–29)
Calcium: 9.4 mg/dL (ref 8.6–10.2)
Chloride: 104 mmol/L (ref 96–106)
Creatinine, Ser: 0.98 mg/dL (ref 0.76–1.27)
Glucose: 91 mg/dL (ref 70–99)
Potassium: 4.5 mmol/L (ref 3.5–5.2)
Sodium: 143 mmol/L (ref 134–144)
eGFR: 81 mL/min/{1.73_m2} (ref >59)

## 2024-04-12 NOTE — H&P (View-Only) (Signed)
 Electrophysiology Office Note:   Date:  04/14/2024  ID:  Donald Molina, DOB Jan 16, 1951, MRN [id number]  Primary Cardiologist: Antionette Kirks, MD Electrophysiologist: Ardeen Kohler, MD      History of Present Illness:   Donald Molina is a 72 y.o. male with h/o normal coronary arteries by LHC in 04/2018, chronic systolic heart failure secondary to NICM, persistent Afib s/p DCCV in 04/2022, HTN, and medication noncompliance who is being seen today for evaluation of his atrial fibrillation.  Discussed the use of AI scribe software for clinical note transcription with the patient, who gave verbal consent to proceed.  History of Present Illness He has a history of atrial fibrillation dating back to at least 2019. Episodes have been associated with admissions for decompensated heart failure and drop in LVEF.  Previous episodes requiring cardioversion and amiodarone . Past cardioversions have successfully restored normal rhythm and he reports significant improvement in symptoms such as leg swelling and dyspnea immediate after. He is currently taking Eliquis  without missing any doses and has previously been on amiodarone  but not currently taking. No current symptoms of leg swelling or dyspnea are present, and he states he feels okay at present.  Review of systems complete and found to be negative unless listed in HPI.   EP Information / Studies Reviewed:    EKG is ordered today. Personal review as below. AF w/ RVR      EKG 03/31/24: AF   Echo 07/13/23:   1. Left ventricular ejection fraction, by estimation, is 25 to 30%. The  left ventricle has severely decreased function. The left ventricle  demonstrates global hypokinesis. There is mild left ventricular  hypertrophy of the basal-septal segment. Left ventricular diastolic function could not be evaluated. The average left  ventricular global longitudinal strain is -9.3 %. The global longitudinal  strain is abnormal with apical sparing. Consider  cardiac amyloidosis.   2. Right ventricular systolic function is severely reduced. The right  ventricular size is mildly enlarged. There is moderately elevated  pulmonary artery systolic pressure. The estimated right ventricular  systolic pressure is 57.5 mmHg.   3. Left atrial size was severely dilated.   4. The mitral valve is normal in structure. Moderate mitral valve  regurgitation. No evidence of mitral stenosis.   5. Tricuspid valve regurgitation is moderate.   6. The aortic valve is tricuspid. Aortic valve regurgitation is trivial.  Aortic valve sclerosis/calcification is present, without any evidence of  aortic stenosis. Aortic valve area, by VTI measures 2.69 cm. Aortic valve  mean gradient measures 1.0 mmHg.   Aortic valve Vmax measures 0.76 m/s.   7. Pulmonic valve regurgitation is moderate.   8. Aortic dilatation noted. There is mild dilatation of the ascending  aorta, measuring 39 mm.   9. The inferior vena cava is dilated in size with <50% respiratory  variability, suggesting right atrial pressure of 15 mmHg.   Risk Assessment/Calculations:    CHA2DS2-VASc Score = 4   This indicates a 4.8% annual risk of stroke. The patient's score is based upon: CHF History: 1 HTN History: 1 Diabetes History: 0 Stroke History: 0 Vascular Disease History: 1 Age Score: 1 Gender Score: 0             Physical Exam:   VS:  BP 122/74 (BP Location: Left Arm, Patient Position: Sitting, Cuff Size: Normal)   Pulse (!) 118   Ht 5\' 11"  (1.803 m)   Wt 179 lb 6.4 oz (81.4 kg)   SpO2 97%   BMI  25.02 kg/m    Wt Readings from Last 3 Encounters:  04/14/24 179 lb 6.4 oz (81.4 kg)  03/31/24 180 lb 9.6 oz (81.9 kg)  03/04/24 180 lb 3.2 oz (81.7 kg)     GEN: Well nourished, well developed in no acute distress NECK: No JVD CARDIAC: Tachycardic, irregular RESPIRATORY:  Clear to auscultation without rales, wheezing or rhonchi  ABDOMEN: Soft, non-tender, non-distended EXTREMITIES:  No  edema; No deformity   ASSESSMENT AND PLAN:    #. Persistent atrial fibrillation: Associated with decompensated heart failure and drop in LVEF. For this reason, we have prioritized a rhythm control strategy. In AF with RVR today. #. Secondary hypercoagulable state due to atrial fibrillation: CHADSVASC score of 4.  - Due to long-term issues with amiodarone  compliance, catheter ablation may be a better option. Patient is agreeable to this. Would like to reassess LVEF in sinus prior to ablation. We will reload amiodarone  400mg  BID for 1 week, 200mg  BID for 1 week, then 200mg  once daily thereafter. He denies any missed doses of Eliquis  but due to history of non-compliance, we will repeat TEE at time of DCCV in 1 week. He will return for follow up with me 3 weeks after cardioversion and have echocardiogram done at that time.  - Continue Eliquis  5mg  BID.   #. Chronic systolic heart failure: LVEF has ranged from 25% in AF to 50-55% in 07/2022 when in normal rhythm.  #. NICM: Presumptive tachy/arrhythmia mediated. Normal LHC in 2019.  - Continue dapagliflozin  10mg  daily, losartan  12.5mg  daily and spironolactone  25mg  daily. Follow up with primary cardiologist.   #Hypertension -At  goal today.  Recommend checking blood pressures 1-2 times per week at home and recording the values.  Recommend bringing these recordings to the primary care physician.  Follow up with Dr. Daneil Dunker in 4 weeks  Signed, Ardeen Kohler, MD

## 2024-04-12 NOTE — Progress Notes (Signed)
 Electrophysiology Office Note:   Date:  04/14/2024  ID:  Donald Molina, DOB Jan 16, 1951, MRN [id number]  Primary Cardiologist: Antionette Kirks, MD Electrophysiologist: Ardeen Kohler, MD      History of Present Illness:   Donald Molina is a 72 y.o. male with h/o normal coronary arteries by LHC in 04/2018, chronic systolic heart failure secondary to NICM, persistent Afib s/p DCCV in 04/2022, HTN, and medication noncompliance who is being seen today for evaluation of his atrial fibrillation.  Discussed the use of AI scribe software for clinical note transcription with the patient, who gave verbal consent to proceed.  History of Present Illness He has a history of atrial fibrillation dating back to at least 2019. Episodes have been associated with admissions for decompensated heart failure and drop in LVEF.  Previous episodes requiring cardioversion and amiodarone . Past cardioversions have successfully restored normal rhythm and he reports significant improvement in symptoms such as leg swelling and dyspnea immediate after. He is currently taking Eliquis  without missing any doses and has previously been on amiodarone  but not currently taking. No current symptoms of leg swelling or dyspnea are present, and he states he feels okay at present.  Review of systems complete and found to be negative unless listed in HPI.   EP Information / Studies Reviewed:    EKG is ordered today. Personal review as below. AF w/ RVR      EKG 03/31/24: AF   Echo 07/13/23:   1. Left ventricular ejection fraction, by estimation, is 25 to 30%. The  left ventricle has severely decreased function. The left ventricle  demonstrates global hypokinesis. There is mild left ventricular  hypertrophy of the basal-septal segment. Left ventricular diastolic function could not be evaluated. The average left  ventricular global longitudinal strain is -9.3 %. The global longitudinal  strain is abnormal with apical sparing. Consider  cardiac amyloidosis.   2. Right ventricular systolic function is severely reduced. The right  ventricular size is mildly enlarged. There is moderately elevated  pulmonary artery systolic pressure. The estimated right ventricular  systolic pressure is 57.5 mmHg.   3. Left atrial size was severely dilated.   4. The mitral valve is normal in structure. Moderate mitral valve  regurgitation. No evidence of mitral stenosis.   5. Tricuspid valve regurgitation is moderate.   6. The aortic valve is tricuspid. Aortic valve regurgitation is trivial.  Aortic valve sclerosis/calcification is present, without any evidence of  aortic stenosis. Aortic valve area, by VTI measures 2.69 cm. Aortic valve  mean gradient measures 1.0 mmHg.   Aortic valve Vmax measures 0.76 m/s.   7. Pulmonic valve regurgitation is moderate.   8. Aortic dilatation noted. There is mild dilatation of the ascending  aorta, measuring 39 mm.   9. The inferior vena cava is dilated in size with <50% respiratory  variability, suggesting right atrial pressure of 15 mmHg.   Risk Assessment/Calculations:    CHA2DS2-VASc Score = 4   This indicates a 4.8% annual risk of stroke. The patient's score is based upon: CHF History: 1 HTN History: 1 Diabetes History: 0 Stroke History: 0 Vascular Disease History: 1 Age Score: 1 Gender Score: 0             Physical Exam:   VS:  BP 122/74 (BP Location: Left Arm, Patient Position: Sitting, Cuff Size: Normal)   Pulse (!) 118   Ht 5\' 11"  (1.803 m)   Wt 179 lb 6.4 oz (81.4 kg)   SpO2 97%   BMI  25.02 kg/m    Wt Readings from Last 3 Encounters:  04/14/24 179 lb 6.4 oz (81.4 kg)  03/31/24 180 lb 9.6 oz (81.9 kg)  03/04/24 180 lb 3.2 oz (81.7 kg)     GEN: Well nourished, well developed in no acute distress NECK: No JVD CARDIAC: Tachycardic, irregular RESPIRATORY:  Clear to auscultation without rales, wheezing or rhonchi  ABDOMEN: Soft, non-tender, non-distended EXTREMITIES:  No  edema; No deformity   ASSESSMENT AND PLAN:    #. Persistent atrial fibrillation: Associated with decompensated heart failure and drop in LVEF. For this reason, we have prioritized a rhythm control strategy. In AF with RVR today. #. Secondary hypercoagulable state due to atrial fibrillation: CHADSVASC score of 4.  - Due to long-term issues with amiodarone  compliance, catheter ablation may be a better option. Patient is agreeable to this. Would like to reassess LVEF in sinus prior to ablation. We will reload amiodarone  400mg  BID for 1 week, 200mg  BID for 1 week, then 200mg  once daily thereafter. He denies any missed doses of Eliquis  but due to history of non-compliance, we will repeat TEE at time of DCCV in 1 week. He will return for follow up with me 3 weeks after cardioversion and have echocardiogram done at that time.  - Continue Eliquis  5mg  BID.   #. Chronic systolic heart failure: LVEF has ranged from 25% in AF to 50-55% in 07/2022 when in normal rhythm.  #. NICM: Presumptive tachy/arrhythmia mediated. Normal LHC in 2019.  - Continue dapagliflozin  10mg  daily, losartan  12.5mg  daily and spironolactone  25mg  daily. Follow up with primary cardiologist.   #Hypertension -At  goal today.  Recommend checking blood pressures 1-2 times per week at home and recording the values.  Recommend bringing these recordings to the primary care physician.  Follow up with Dr. Daneil Dunker in 4 weeks  Signed, Ardeen Kohler, MD

## 2024-04-14 ENCOUNTER — Other Ambulatory Visit: Payer: Self-pay

## 2024-04-14 ENCOUNTER — Ambulatory Visit: Attending: Cardiology | Admitting: Cardiology

## 2024-04-14 ENCOUNTER — Encounter: Payer: Self-pay | Admitting: Cardiology

## 2024-04-14 VITALS — BP 122/74 | HR 118 | Ht 71.0 in | Wt 179.4 lb

## 2024-04-14 DIAGNOSIS — I10 Essential (primary) hypertension: Secondary | ICD-10-CM

## 2024-04-14 DIAGNOSIS — I4819 Other persistent atrial fibrillation: Secondary | ICD-10-CM

## 2024-04-14 DIAGNOSIS — D6869 Other thrombophilia: Secondary | ICD-10-CM

## 2024-04-14 DIAGNOSIS — I5022 Chronic systolic (congestive) heart failure: Secondary | ICD-10-CM

## 2024-04-14 DIAGNOSIS — I428 Other cardiomyopathies: Secondary | ICD-10-CM

## 2024-04-14 MED ORDER — AMIODARONE HCL 200 MG PO TABS
ORAL_TABLET | ORAL | 3 refills | Status: AC
Start: 2024-04-14 — End: 2025-04-23

## 2024-04-14 NOTE — Patient Instructions (Signed)
 Medication Instructions:  Your physician has recommended you make the following change in your medication:   1) START taking amiodarone  400 mg twice daily for one week, then 400 mg once daily for one week, then 200 mg once daily thereafter  *If you need a refill on your cardiac medications before your next appointment, please call your pharmacy*  Lab Work: TODAY: BMET and CBC  If you have labs (blood work) drawn today and your tests are completely normal, you will receive your results only by: MyChart Message (if you have MyChart) OR A paper copy in the mail If you have any lab test that is abnormal or we need to change your treatment, we will call you to review the results.  Testing/Procedures: TEE/Cardioversion  Your physician has requested that you have a TEE/Cardioversion. During a TEE, sound waves are used to create images of your heart. It provides your doctor with information about the size and shape of your heart and how well your heart's chambers and valves are working. In this test, a transducer is attached to the end of a flexible tube that is guided down you throat and into your esophagus (the tube leading from your mouth to your stomach) to get a more detailed image of your heart. Once the TEE has determined that a blood clot is not present, the cardioversion begins. Electrical Cardioversion uses a jolt of electricity to your heart either through paddles or wired patches attached to your chest. This is a controlled, usually prescheduled, procedure. This procedure is done at the hospital and you are not awake during the procedure. You usually go home the day of the procedure. Please see the instruction sheet given to you today for more information.  Echocardiogram - in 4 weeks  Your physician has requested that you have an echocardiogram. Echocardiography is a painless test that uses sound waves to create images of your heart. It provides your doctor with information about the size and  shape of your heart and how well your heart's chambers and valves are working. This procedure takes approximately one hour. There are no restrictions for this procedure. Please do NOT wear cologne, perfume, aftershave, or lotions (deodorant is allowed). Please arrive 15 minutes prior to your appointment time.  Follow-Up: At [facility], you and your health needs are our priority.  As part of our continuing mission to provide you with exceptional heart care, our providers are all part of one team.  This team includes your primary Cardiologist (physician) and Advanced Practice Providers or APPs (Physician Assistants and Nurse Practitioners) who all work together to provide you with the care you need, when you need it.  Your next appointment:   4-5 weeks  Provider:   Ardeen Kohler, MD

## 2024-04-19 IMAGING — CT CT ANGIO CHEST
2 of 7 series · 17 of 46 positions shown · IV contrast (APPLIED)
Comparison: 04/19/2021

CLINICAL DATA: Chest tightness and shortness of breath 2 days.
Possible pulmonary embolism.

EXAM:
CT ANGIOGRAPHY CHEST WITH CONTRAST
TECHNIQUE: Multidetector CT imaging of the chest was performed using the
standard protocol during bolus administration of intravenous
contrast. Multiplanar CT image reconstructions and MIPs were
obtained to evaluate the vascular anatomy.

[Series 7: thins · axial · 0.79mm/px · z∈[+1367,+1619]mm · 14 of 406 slices shown]
[im 23/406  lung]
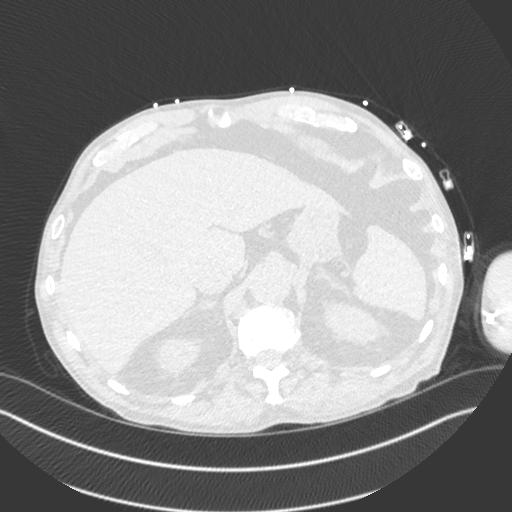
[im 46/406  soft-tissue]
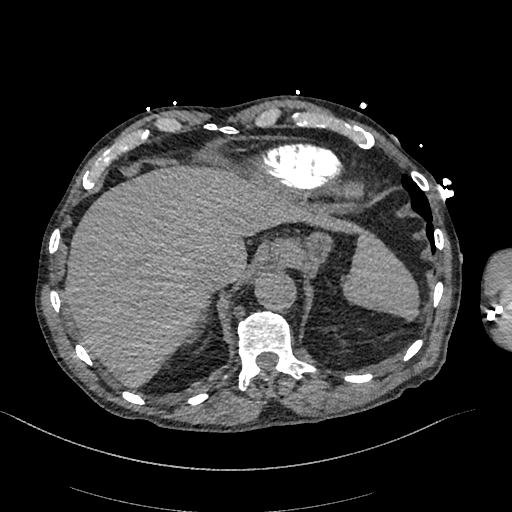
[im 91/406  lung]
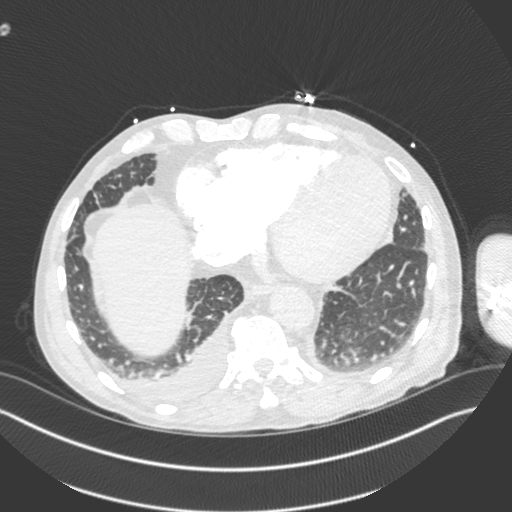
[im 113/406  soft-tissue]
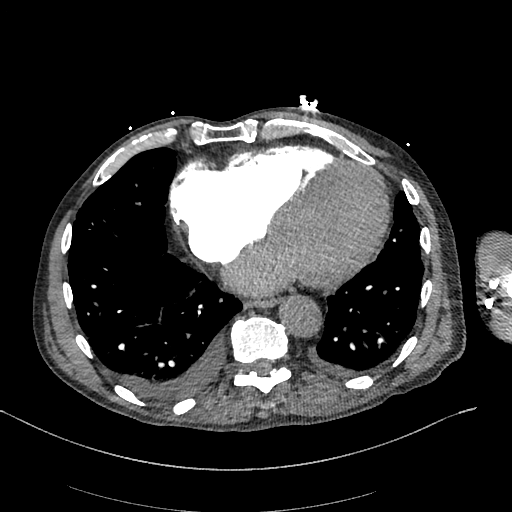
[im 136/406  lung]
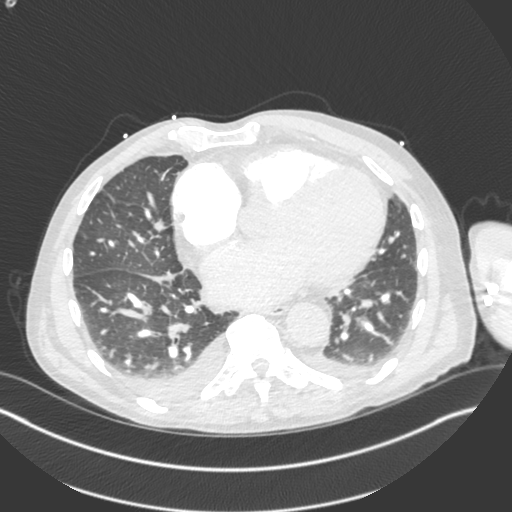
[im 158/406  soft-tissue]
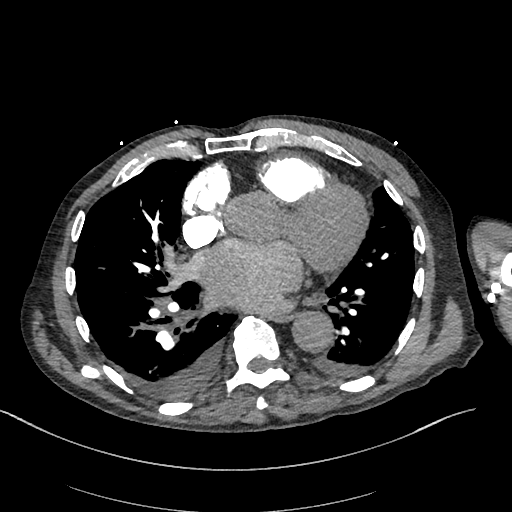
[im 181/406  lung]
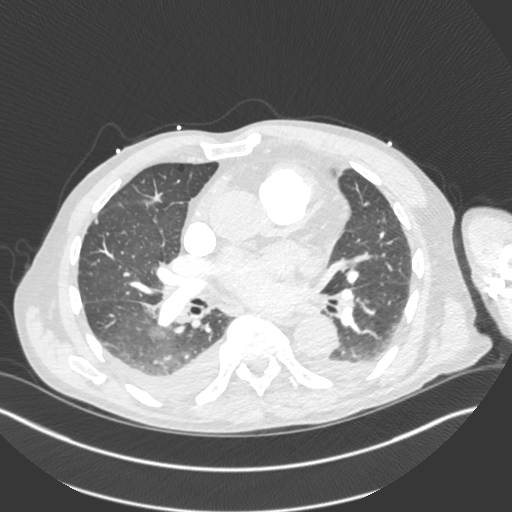
[im 226/406  soft-tissue]
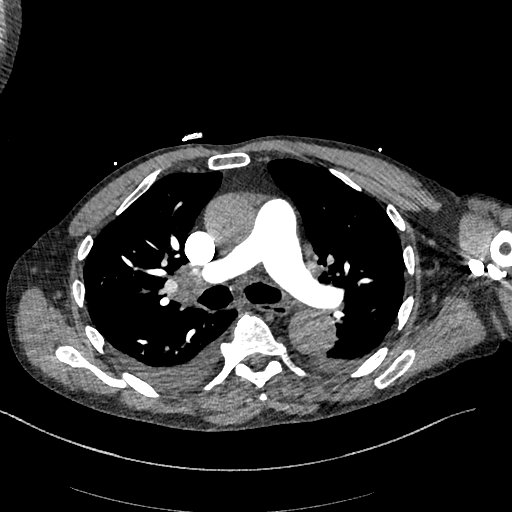
[im 248/406  lung]
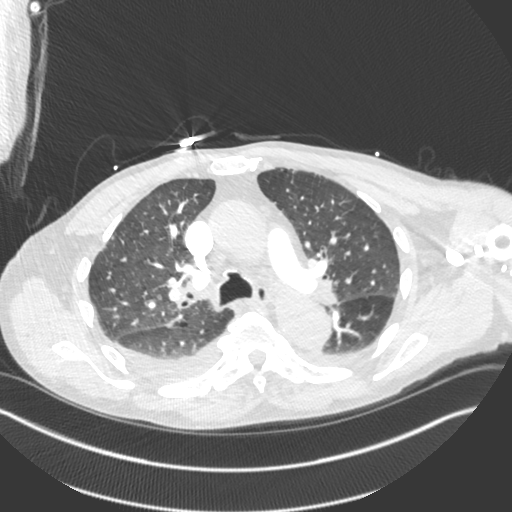
[im 271/406  soft-tissue]
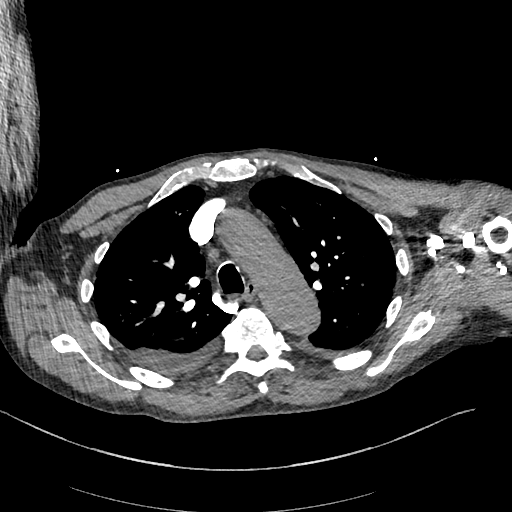
[im 293/406  lung]
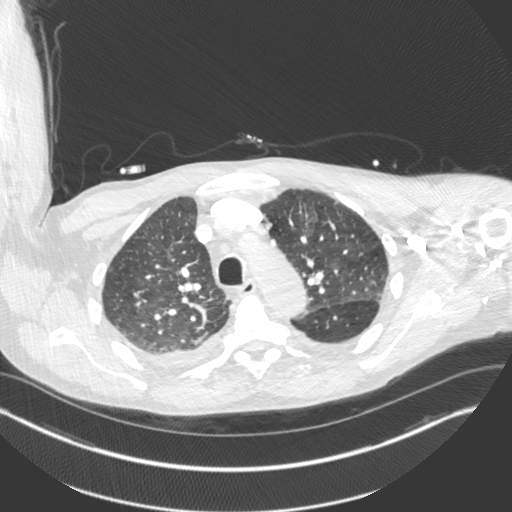
[im 316/406  soft-tissue]
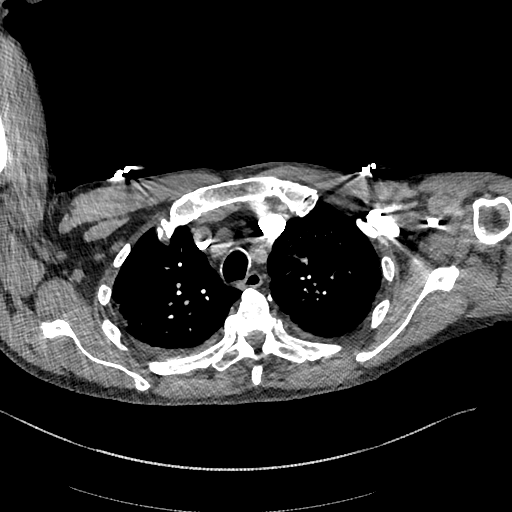
[im 361/406  lung]
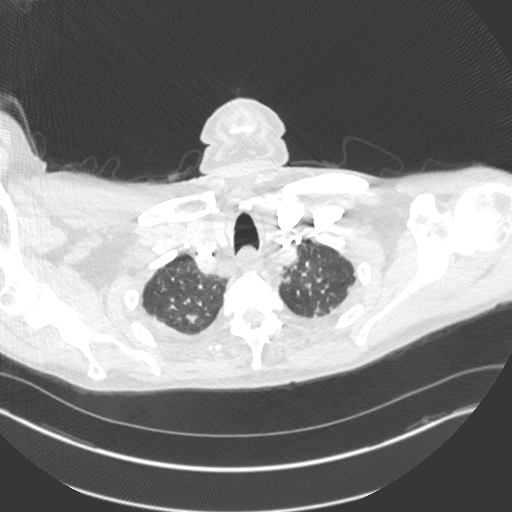
[im 383/406  soft-tissue]
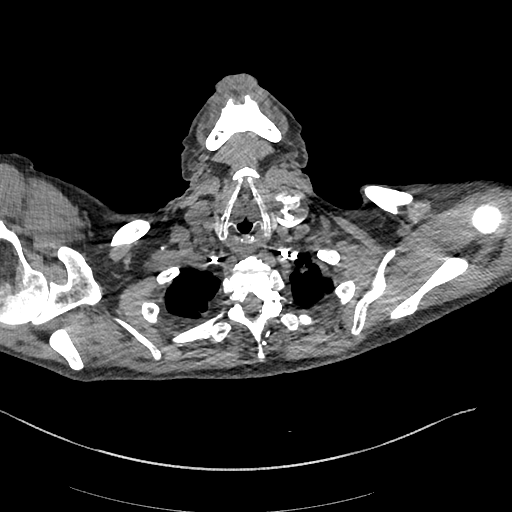

[Series 8: cor · coronal · 0.57mm/px · 3 of 132 slices shown]
[im 33/132  soft-tissue]
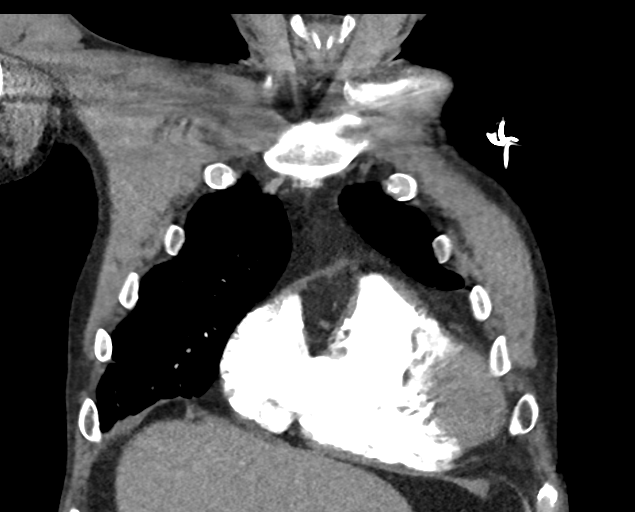
[im 66/132  soft-tissue]
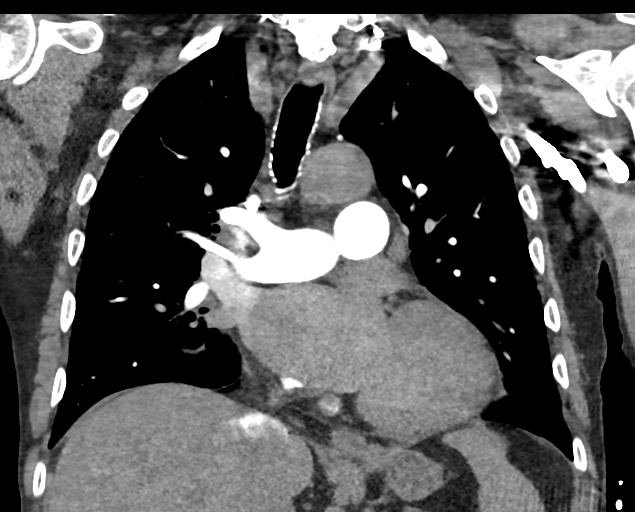
[im 99/132  soft-tissue]
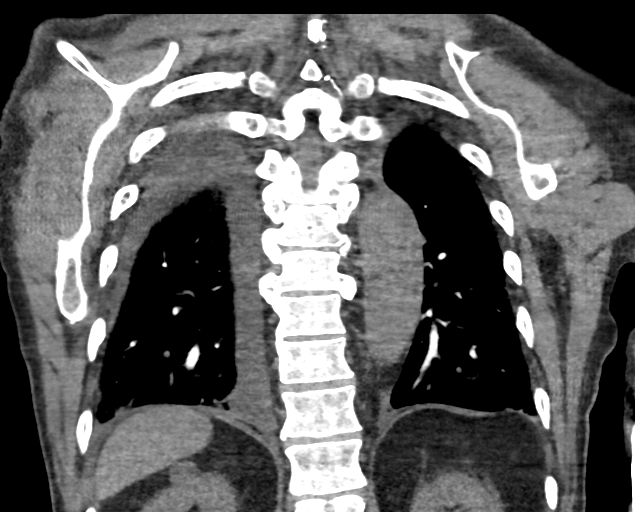

[17 of 46 positions shown; findings below may reference images not displayed]

RADIATION DOSE REDUCTION: This exam was performed according to the
departmental dose-optimization program which includes automated
exposure control, adjustment of the mA and/or kV according to
patient size and/or use of iterative reconstruction technique.

CONTRAST:  75mL OMNIPAQUE IOHEXOL 350 MG/ML SOLN
FINDINGS: Cardiovascular: Mild stable cardiomegaly. Ascending thoracic aorta
measures 3.8 cm unchanged. Pulmonary arterial system is well
opacified without evidence of emboli. Remaining vascular structures
are unremarkable.

Mediastinum/Nodes: 1.4 cm subcarinal lymph node likely reactive.
Possible 1.4 cm right hilar lymph node. No left hilar adenopathy.
Remainder of the mediastinum is unremarkable.

Lungs/Pleura: Mild biapical pleural thickening with minimal biapical
nodularity unchanged. Small right effusion and tiny amount left
pleural fluid. Subtle hazy density over the right infrahilar region
which may be due to asymmetric vascular congestion versus early
infection or atelectasis. Remainder of the right lung as well as the
left lung is clear. Airways are within normal.

Upper Abdomen: Minimal cystic change over the visualized upper
kidneys. No acute findings.

Musculoskeletal: No focal abnormality.

Review of the MIP images confirms the above findings.
IMPRESSION: 1. No evidence of pulmonary embolism.
2. Subtle hazy density over the right infrahilar region which may be
due to asymmetric vascular congestion versus early infection or
atelectasis. Small right effusion and tiny amount of left pleural
fluid. Mild reactive mediastinal and right hilar adenopathy.
3. Mild stable cardiomegaly.
4. Bilateral renal cysts.
5. Stable ectasia of the ascending thoracic aorta measuring 3.8 cm.
Recommend annual imaging followup by CTA or MRA. This recommendation
follows 5989 ACCF/AHA/AATS/ACR/ASA/SCA/CABALLO/FERITE/DELOWR/MESS Guidelines
for the Diagnosis and Management of Patients with Thoracic Aortic
Disease. Circulation.5989; 121: E266-e369. Aortic aneurysm NOS
([id number]).

## 2024-04-21 ENCOUNTER — Telehealth: Payer: Self-pay | Admitting: Family

## 2024-04-21 NOTE — Progress Notes (Deleted)
 Advanced Heart Failure Clinic Note    PCP: None (used to go to [facility]) Cardiologist: Antionette Kirks, MD / Gennaro Khat, [place], PA (last seen 05/25)  Chief Complaint:    HPI:  Mr Leinberger is a 73 y/o male with a history of atrial fibrillation/ flutter (cardioverted 07/23), HTN, pulmonary HTN (06/19), NSTEMI, remote tobacco use and chronic heart failure.   Admitted 04/2018 with A-fib and volume overload. Echo demonstrated an EF of 25 to 30%. Cardiac cath showed normal coronary arteries. Following diuresis and amiodarone  loading, he underwent successful DCCV.  He had recurrent A-fib following discharge in the setting of medication noncompliance with subsequent successful cardioversion thereafter following initiation of medications.  Follow-up echo in 06/2018 showed an improved LVEF of 45 to 50%.   Admitted 07/03/2019 with dyspnea recurrent A-fib in the setting of medication noncompliance.  EF was again found to be reduced at 30 to 35% with diffuse hypokinesis.  He was diuresed and placed back on beta-blocker and apixaban    Admitted 03/2022 with volume overload and recurrent A-fib in the context of medication noncompliance.  He reported having not filled his medications for at least a year.  High-sensitivity troponin peaked at 80 with subsequent downtrend.  BNP 1383.  Echo demonstrated an EF of 25%, global hypokinesis with LVH, indeterminate LV diastolic function parameters, moderately reduced RV systolic function with normal ventricular cavity size and mildly increased ventricular wall thickness, elevated PASP estimated at 64 mmHg, moderately dilated left atrium moderate to severe mitral valve regurgitation, moderate tricuspid regurgitation, and an estimated right atrial pressure 15 mmHg.  He underwent diuresis and escalation of GDMT.  He subsequently underwent unusuccessful TEE guided DCCV with 2 shocks at 200 J.  Echo 08/15/22: EF 50-55% with grade I DD    Admitted 07/13/23 due to shortness of breath X  2 days along with PND and orthopnea. Heart rate in the 130-150s, blood pressure significantly elevated 160/110, no hypoxia. Chest x-ray showed cardiomegaly and pulmonary congestion. Covid +. BNP 1,668. Troponin 21>20. Initially given IV cardizem  bolus & gtt. Cardiology consulted. 1 dose of IV lasix  given. Converted to NSR on cardizem  gtt. Echo 07/13/23: EF 25-30% with mild LVH, moderately elevated PA Pressure of 57.5 mmHg, severe LAE, moderate MR/ TR/ PR, mild dilatation of ascending aorta of 39 mm, the global longitudinal strain is abnormal (9.3%) with apical sparing. Consider cardiac amyloidosis. Arrhythmia thought to have been triggered by covid. EP consulted but did not see patient while inpatient. Follow-up with EP as outpatient.   Was in the ED 11/06/23 due to worsening bilateral lower leg edema along with shortness of breath. Ran out of metoprolol  as he needed refills. Admitted to hospitalist service for Afib RVR and CHF exacerbation. Started on diltiazem  gtt and diuresis IV. Edema resolved and off diltiazem  gtt.    Was in the ED 03/01/24 with leg swelling after running out of lasix  for the preceding week. CXR negative. Lasix  provided and he was released.   Seen in [facility] 04/25 where spironolactone  25mg  daily was started and furosemide  was decreased to 40mg  daily w/additional 40mg  if needed.  Saw both cardiology and EP 05/25 and amiodarone  loaded. Scheduled for TEE 06/25 with subsequent DCCV. Plan for future ablation.   He presents today with a chief complaint of a heart failure visit.    Previous cardiac studies:  RHC/ LHC done 05/05/18: normal coronary arteries, severely elevated filling pressures, moderate pulmonary HTN and severely reduced cardiac output. CO was 2.98 with cardiac index of 1.47  Echo 05/03/18: EF 25-30% along with moderate MR/TR and moderately elevated PA pressure of 52 mm Hg.  TEE 05/07/18: EF 20-25% along with trivial AR, mild/mod MR & TR and small pleural effusion.  ROS: All  systems negative except what is listed in HPI, PMH and Problem List   Past Medical History:  Diagnosis Date   CHF (congestive heart failure) (HCC)    HFrEF (heart failure with reduced ejection fraction) (HCC)    a. 04/2018 Echo: EF 25-30%; b. 06/2018 Echo: EF 45-50%. diff HK, Gr1 DD. Nl RV fxn; c. 06/2019 Echo: EF 30-35%, diff HK, mod reduced RV fxn, mild BAE, mild-mod MR, mild PS.   Hypertension    NICM (nonischemic cardiomyopathy) (HCC)    a. 04/2018 Echo: EF 25-30%; b. 06/2018 Echo: EF 45-50%; c. 06/2019 Echo: EF 30-35%.   Persistent atrial fibrillation (HCC)    a. Dx 04/2018 s/p DCCV and amio loading. CHA2DS2VASc 3-->eliquis .    Current Outpatient Medications  Medication Sig Dispense Refill   amiodarone  (PACERONE ) 200 MG tablet Take 2 tablets (400 mg total) by mouth 2 (two) times daily for 7 days, THEN 2 tablets (400 mg total) daily for 7 days, THEN 1 tablet (200 mg total) daily. 110 tablet 3   apixaban  (ELIQUIS ) 5 MG TABS tablet Take 1 tablet (5 mg total) by mouth 2 (two) times daily. 180 tablet 3   dapagliflozin  propanediol (FARXIGA ) 10 MG TABS tablet Take 1 tablet (10 mg total) by mouth daily before breakfast. 90 tablet 3   furosemide  (LASIX ) 40 MG tablet Take 40 mg by mouth daily. With additional 40mg  PRN     losartan  (COZAAR ) 25 MG tablet Take 0.5 tablets (12.5 mg total) by mouth daily. 45 tablet 3   spironolactone  (ALDACTONE ) 25 MG tablet Take 1 tablet (25 mg total) by mouth daily. 90 tablet 3   No current facility-administered medications for this visit.    Allergies  Allergen Reactions   Entresto  [Sacubitril -Valsartan ] Other (See Comments)    hypotension      Social History   Socioeconomic History   Marital status: Divorced    Spouse name: Not on file   Number of children: 2   Years of education: 12   Highest education level: 12th grade  Occupational History   Occupation: retired  Tobacco Use   Smoking status: Former    Current packs/day: 0.00    Types: Cigarettes     Start date: 05/28/1988    Quit date: 05/28/1998    Years since quitting: 25.9   Smokeless tobacco: Former    Types: Chew    Quit date: 05/28/1998  Vaping Use   Vaping status: Never Used  Substance and Sexual Activity   Alcohol use: Yes    Alcohol/week: 12.0 standard drinks of alcohol    Types: 12 Cans of beer per week   Drug use: Never   Sexual activity: Yes    Birth control/protection: Condom  Other Topics Concern   Not on file  Social History Narrative   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   Social Drivers of Corporate investment banker Strain: Low Risk  (08/12/2019)   Received [facility], [facility]   Overall Financial Resource Strain (CARDIA)    Difficulty of Paying Living Expenses: Not hard at all  Food Insecurity: No Food Insecurity (07/13/2023)   Hunger Vital Sign    Worried About Running Out of Food in the Last Year: Never true  Ran Out of Food in the Last Year: Never true  Transportation Needs: No Transportation Needs (07/13/2023)   PRAPARE - Administrator, Civil Service (Medical): No    Lack of Transportation (Non-Medical): No  Physical Activity: Insufficiently Active (05/28/2018)   Exercise Vital Sign    Days of Exercise per Week: 7 days    Minutes of Exercise per Session: 20 min  Stress: No Stress Concern Present (05/28/2018)   [employer] of Occupational Health - Occupational Stress Questionnaire    Feeling of Stress : Not at all  Social Connections: Not on file  Intimate Partner Violence: Not At Risk (07/13/2023)   Humiliation, Afraid, Rape, and Kick questionnaire    Fear of Current or Ex-Partner: No    Emotionally Abused: No    Physically Abused: No    Sexually Abused: No      Family History  Problem Relation Age of Onset   Cancer Mother        pancreatic   Cervical cancer Mother    Dementia Father    Heart failure Father    Heart disease Sister       PHYSICAL EXAM: General:  Well  appearing. No respiratory difficulty HEENT: normal Neck: supple. no JVD. No lymphadenopathy or thyromegaly appreciated. Cor: PMI nondisplaced. Regular rate & irregular rhythm. No rubs, gallops or murmurs. Lungs: clear Abdomen: soft, nontender, nondistended. No hepatosplenomegaly. No bruits or masses.  Extremities: no cyanosis, clubbing, rash, trace pitting edema bilateral lower legs Neuro: alert & oriented x 3, cranial nerves grossly intact. moves all 4 extremities w/o difficulty. Affect pleasant.   ECG: not done   ASSESSMENT & PLAN:  1: NICM with reduced ejection fraction- - suspect due to ? pHTN, amyloid, persistent atrial fibrillation - NYHA class I - euvolemic today - weight 180.2 pounds from last visit here 6 weeks ago - Echo 08/15/22: EF 50-55% with grade I DD - Echo 07/13/23: EF 25-30% with mild LVH, moderately elevated PA Pressure of 57.5 mmHg, severe LAE, moderate MR/ TR/ PR, mild dilatation of ascending aorta of 39 mm, the global longitudinal strain is abnormal (9.3%) with apical sparing. Consider cardiac amyloidosis.  - will schedule cMRI to evaluate for amyloid - continue farxiga  10mg  daily  - continue furosemide  40mg  daily with additional 40mg  PRN since adding spiro today - continue losartan  12.5mg  daily; entresto  caused hypotension in the past - continue metoprolol  tartrate 50mg  BID - continue spironolactone  25mg  daily - BNP 03/01/24 was 420.8  2: HTN- - BP  - has seen PCP @ [facility] in the past; discussed, again, the importance of getting back in with [facility] if that's where he wants to stay - BMP 03/31/24 reviewed: sodium 143, potassium 4.5, creatinine 0.98 and GFR 81.  - BMET next visit  3: Persistent Atrial fibrillation/ flutter- - cardioverted 07/23 - saw EP Daneil Dunker) 05/25 - scheduled for TEE/ DCCV 06/25 - continue amiodarone   - continue apixaban  5mg  BID - continue metoprololl tartrate 50mg  BID  4: CAD- - RHC/ LHC done 05/05/18: normal coronary  arteries, severely elevated filling pressures, moderate pulmonary HTN and severely reduced cardiac output. CO was 2.98 with cardiac index of 1.47 - LDL 11/15/23 was 84 - saw cardiology Gennaro Khat) 05/25     Shawnee Dellen, FNP 04/21/24

## 2024-04-21 NOTE — Telephone Encounter (Signed)
 Called to confirm/remind patient of their appointment at the Advanced Heart Failure Clinic on 04/22/24.   Appointment:   [x] Confirmed  [] Left mess   [] No answer/No voice mail  [] VM Full/unable to leave message  [] Phone not in service  Patient reminded to bring all medications and/or complete list.  Confirmed patient has transportation. Gave directions, instructed to utilize valet parking.

## 2024-04-22 ENCOUNTER — Telehealth: Payer: Self-pay | Admitting: Family

## 2024-04-22 ENCOUNTER — Encounter: Admitting: Family

## 2024-04-22 NOTE — Telephone Encounter (Signed)
 Patient did not show for his Heart Failure Clinic appointment on 04/22/24.

## 2024-04-23 ENCOUNTER — Telehealth: Payer: Self-pay | Admitting: *Deleted

## 2024-04-23 NOTE — Telephone Encounter (Signed)
 Unable to reach patient at both numbers listed. Calling to review instructions for his upcoming TEE along  with date, time, and location.    Scheduled for: TEE Date: 6/2 Time: 07:30 am Arrival Time: 06:30 am Location: [facility]

## 2024-04-24 ENCOUNTER — Encounter: Payer: Self-pay | Admitting: Anesthesiology

## 2024-04-27 ENCOUNTER — Ambulatory Visit
Admission: RE | Admit: 2024-04-27 | Discharge: 2024-04-27 | Disposition: A | Discharge status: Home / Self Care | Source: Home / Self Care | Attending: Cardiology | Admitting: Cardiology

## 2024-04-27 ENCOUNTER — Ambulatory Visit
Admission: RE | Admit: 2024-04-27 | Discharge: 2024-04-27 | Disposition: A | Discharge status: Home / Self Care | Attending: Cardiology | Admitting: Cardiology

## 2024-04-27 ENCOUNTER — Telehealth: Payer: Self-pay | Admitting: *Deleted

## 2024-04-27 ENCOUNTER — Encounter
Admission: RE | Disposition: A | Discharge status: Home / Self Care | Payer: Self-pay | Source: Home / Self Care | Attending: Cardiology

## 2024-04-27 DIAGNOSIS — I4891 Unspecified atrial fibrillation: Secondary | ICD-10-CM | POA: Insufficient documentation

## 2024-04-27 DIAGNOSIS — Z538 Procedure and treatment not carried out for other reasons: Secondary | ICD-10-CM | POA: Diagnosis not present

## 2024-04-27 DIAGNOSIS — Z01818 Encounter for other preprocedural examination: Secondary | ICD-10-CM

## 2024-04-27 SURGERY — ECHOCARDIOGRAM, TRANSESOPHAGEAL
Anesthesia: General

## 2024-04-27 MED ORDER — SODIUM CHLORIDE 0.9 % IV SOLN: INTRAVENOUS | Status: DC

## 2024-04-27 NOTE — Progress Notes (Signed)
 Pt. Arrived for procedure, stating he "ate a biscuit this morning."Updatet pt. Cell # and called pt. Sister to come get pt. Pt. To be rescheduled .

## 2024-04-27 NOTE — Telephone Encounter (Signed)
 Spoke with patients sister and rescheduled TEE/DCCV for next week on 05/06/24 at 07:30 am and arrival time of 06:30 am. She confirmed information with no further questions at this time.

## 2024-04-27 NOTE — Telephone Encounter (Signed)
 Will reach out to patients sister to rescheduled TEE/DCCV.

## 2024-05-05 ENCOUNTER — Telehealth: Payer: Self-pay | Admitting: *Deleted

## 2024-05-05 NOTE — Telephone Encounter (Signed)
 Scheduled for: TEE Date: 05/06/24 Time: 07:30 am Arrival Time: 06:30 am Location: [facility] Instructions: Reviewed with patients sister.   Patient's sister verbalized understanding of all information, location, and time with strict instructions to not eat or drink anything after midnight but to make sure he takes his Eliquis . She repeated all information back with no further questions at this time.

## 2024-05-06 ENCOUNTER — Other Ambulatory Visit: Payer: Self-pay

## 2024-05-06 ENCOUNTER — Ambulatory Visit: Admitting: Anesthesiology

## 2024-05-06 ENCOUNTER — Ambulatory Visit
Admission: RE | Admit: 2024-05-06 | Discharge: 2024-05-06 | Disposition: A | Discharge status: Home / Self Care | Attending: Cardiology | Admitting: Cardiology

## 2024-05-06 ENCOUNTER — Ambulatory Visit: Admission: RE | Admit: 2024-05-06 | Source: Home / Self Care | Admitting: Cardiology

## 2024-05-06 ENCOUNTER — Encounter: Payer: Self-pay | Admitting: Cardiology

## 2024-05-06 ENCOUNTER — Ambulatory Visit
Admission: RE | Admit: 2024-05-06 | Discharge: 2024-05-06 | Disposition: A | Discharge status: Home / Self Care | Source: Home / Self Care | Attending: Cardiology | Admitting: Cardiology

## 2024-05-06 ENCOUNTER — Encounter
Admission: RE | Disposition: A | Discharge status: Home / Self Care | Payer: Self-pay | Source: Home / Self Care | Attending: Cardiology

## 2024-05-06 DIAGNOSIS — I4819 Other persistent atrial fibrillation: Secondary | ICD-10-CM

## 2024-05-06 DIAGNOSIS — I340 Nonrheumatic mitral (valve) insufficiency: Secondary | ICD-10-CM

## 2024-05-06 DIAGNOSIS — Z01818 Encounter for other preprocedural examination: Secondary | ICD-10-CM

## 2024-05-06 DIAGNOSIS — Z7901 Long term (current) use of anticoagulants: Secondary | ICD-10-CM | POA: Diagnosis not present

## 2024-05-06 DIAGNOSIS — I428 Other cardiomyopathies: Secondary | ICD-10-CM | POA: Insufficient documentation

## 2024-05-06 DIAGNOSIS — I4891 Unspecified atrial fibrillation: Secondary | ICD-10-CM | POA: Diagnosis not present

## 2024-05-06 DIAGNOSIS — Z79899 Other long term (current) drug therapy: Secondary | ICD-10-CM | POA: Diagnosis not present

## 2024-05-06 DIAGNOSIS — D6869 Other thrombophilia: Secondary | ICD-10-CM | POA: Insufficient documentation

## 2024-05-06 DIAGNOSIS — I361 Nonrheumatic tricuspid (valve) insufficiency: Secondary | ICD-10-CM | POA: Diagnosis not present

## 2024-05-06 DIAGNOSIS — I5022 Chronic systolic (congestive) heart failure: Secondary | ICD-10-CM | POA: Insufficient documentation

## 2024-05-06 DIAGNOSIS — I440 Atrioventricular block, first degree: Secondary | ICD-10-CM | POA: Diagnosis not present

## 2024-05-06 DIAGNOSIS — I110 Hypertensive heart disease with heart failure: Secondary | ICD-10-CM | POA: Diagnosis not present

## 2024-05-06 DIAGNOSIS — I4892 Unspecified atrial flutter: Secondary | ICD-10-CM | POA: Diagnosis not present

## 2024-05-06 HISTORY — PX: TEE WITHOUT CARDIOVERSION: SHX5443

## 2024-05-06 HISTORY — PX: CARDIOVERSION: SHX1299

## 2024-05-06 SURGERY — ECHOCARDIOGRAM, TRANSESOPHAGEAL
Anesthesia: General

## 2024-05-06 MED ORDER — PROPOFOL 10 MG/ML IV BOLUS
INTRAVENOUS | Status: DC | PRN
Start: 2024-05-06 — End: 2024-05-06
  Administered 2024-05-06 (×2): 50 mg via INTRAVENOUS
  Administered 2024-05-06: 40 mg via INTRAVENOUS
  Administered 2024-05-06: 50 mg via INTRAVENOUS
  Administered 2024-05-06: 10 mg via INTRAVENOUS

## 2024-05-06 MED ORDER — EPHEDRINE 5 MG/ML INJ
INTRAVENOUS | Status: AC
  Filled 2024-05-06: qty 5

## 2024-05-06 MED ORDER — LIDOCAINE HCL (PF) 2 % IJ SOLN
INTRAMUSCULAR | Status: AC
  Filled 2024-05-06: qty 5

## 2024-05-06 MED ORDER — PHENYLEPHRINE 80 MCG/ML (10ML) SYRINGE FOR IV PUSH (FOR BLOOD PRESSURE SUPPORT)
PREFILLED_SYRINGE | INTRAVENOUS | Status: DC | PRN
  Administered 2024-05-06: 80 ug via INTRAVENOUS

## 2024-05-06 MED ORDER — PROPOFOL 10 MG/ML IV BOLUS
INTRAVENOUS | Status: AC
  Filled 2024-05-06: qty 40

## 2024-05-06 MED ORDER — BUTAMBEN-TETRACAINE-BENZOCAINE 2-2-14 % EX AERO
INHALATION_SPRAY | CUTANEOUS | Status: AC
  Filled 2024-05-06: qty 5

## 2024-05-06 MED ORDER — LIDOCAINE HCL URETHRAL/MUCOSAL 2 % EX GEL
CUTANEOUS | Status: AC
  Filled 2024-05-06: qty 6

## 2024-05-06 MED ORDER — SODIUM CHLORIDE 0.9 % IV SOLN: INTRAVENOUS | Status: DC

## 2024-05-06 MED ORDER — PHENYLEPHRINE 80 MCG/ML (10ML) SYRINGE FOR IV PUSH (FOR BLOOD PRESSURE SUPPORT)
PREFILLED_SYRINGE | INTRAVENOUS | Status: AC
  Filled 2024-05-06: qty 20

## 2024-05-06 MED ORDER — LIDOCAINE HCL (CARDIAC) PF 50 MG/5ML IV SOSY
PREFILLED_SYRINGE | INTRAVENOUS | Status: DC | PRN
  Administered 2024-05-06: 80 mg via INTRAVENOUS

## 2024-05-06 MED ORDER — LIDOCAINE VISCOUS HCL 2 % MT SOLN
OROMUCOSAL | Status: AC
  Filled 2024-05-06: qty 15

## 2024-05-06 NOTE — Interval H&P Note (Signed)
 History and Physical Interval Note:  05/06/2024 8:10 AM  Donald Molina  has presented today for surgery, with the diagnosis of persistent Afib.  The various methods of treatment have been discussed with the patient and family. After consideration of risks, benefits and other options for treatment, the patient has consented to  Procedure(s): ECHOCARDIOGRAM, TRANSESOPHAGEAL (N/A) CARDIOVERSION (N/A) as a surgical intervention.  The patient's history has been reviewed, patient examined, no change in status, stable for surgery.  I have reviewed the patient's chart and labs.  Questions were answered to the patient's satisfaction.     Polly Brink Agbor-Etang

## 2024-05-06 NOTE — Procedures (Signed)
 Transesophageal Echocardiogram  and DC cardioversion:  Indication: atrial fibrillation Requesting/ordering  physician:   Consent: Risks of procedure as well as the alternatives and risks of each were explained to the (patient/caregiver).  Consent for procedure obtained.  Time Out: Verified patient identification, verified procedure, site/side was marked, verified correct patient position, special equipment/implants available, medications/allergies/relevent history reviewed, required imaging and test results available.  Performed  Procedure: 10 ml of viscous lidocaine  were given orally to provide local anesthesia to the oropharynx. The patient was positioned supine on the left side, bite block provided. The patient was sedated per anesthesia team.  Using digital technique an omniplane probe was advanced into the esophagus without incident.   See report in EPIC  for complete details: In brief, imaging revealed moderately reduced LV function with global hypokinesis. There was no mural apical thrombus.  Estimated ejection fraction was 35%.  Right sided cardiac chambers were normal with no evidence of pulmonary hypertension.  Imaging of the septum showed no ASD or VSD 2D and color flow confirmed no PFO  The LA was well visualized in orthogonal views.  There was no thrombus in the LA and LA appendage   The descending thoracic aorta had no  mural aortic debris with no evidence of aneurysmal dilation or dissection.  Cardioversion procedure note For atrial fibrillation.  Patient placed on cardiac monitor, pulse oximetry, supplemental oxygen  as necessary.   Sedation given: propofol  IV per anesthesia team  Pacer pads placed anterior and posterior chest.  Cardioverted 1 time(s).   Cardioverted at  200J. Synchronized biphasic Converted to NSR  Evaluation: Findings: Post procedure EKG shows: NSR Complications: None Patient did tolerate procedure well.   Constancia Delton, M.D.   Polly Brink  Agbor[place]Etang 05/06/2024 8:11 AM

## 2024-05-06 NOTE — Progress Notes (Signed)
*  PRELIMINARY RESULTS* Echocardiogram Echocardiogram Transesophageal has been performed.  Donald Molina 05/06/2024, 8:12 AM

## 2024-05-06 NOTE — Transfer of Care (Signed)
 Immediate Anesthesia Transfer of Care Note  Patient: Donald Molina  Procedure(s) Performed: ECHOCARDIOGRAM, TRANSESOPHAGEAL CARDIOVERSION  Patient Location: PACU  Anesthesia Type:MAC  Level of Consciousness: sedated  Airway & Oxygen  Therapy: Patient Spontanous Breathing and Patient connected to nasal cannula oxygen   Post-op Assessment: Report given to RN and Post -op Vital signs reviewed and stable  Post vital signs: stable  Last Vitals:  Vitals Value Taken Time  BP    Temp    Pulse    Resp    SpO2      Last Pain:  Vitals:   05/06/24 0704  TempSrc: Oral  PainSc: 0-No pain         Complications: No notable events documented.

## 2024-05-06 NOTE — Anesthesia Preprocedure Evaluation (Addendum)
 Anesthesia Evaluation  Patient identified by MRN, date of birth, ID band Patient awake    Reviewed: Allergy & Precautions, NPO status , Patient's Chart, lab work & pertinent test results  History of Anesthesia Complications Negative for: history of anesthetic complications  Airway Mallampati: III  TM Distance: >3 FB Neck ROM: full    Dental  (+) Edentulous Upper, Missing, Loose,    Pulmonary former smoker   Pulmonary exam normal        Cardiovascular hypertension, On Medications + Past MI and +CHF  + dysrhythmias Atrial Fibrillation  Rhythm:Irregular Rate:Normal  IMPRESSIONS     1. Left ventricular ejection fraction, by estimation, is 25 to 30%. The  left ventricle has severely decreased function. The left ventricle  demonstrates global hypokinesis. There is mild left ventricular  hypertrophy of the basal-septal segment. Left  ventricular diastolic function could not be evaluated. The average left  ventricular global longitudinal strain is -9.3 %. The global longitudinal  strain is abnormal with apical sparing. Consider cardiac amyloidosis.   2. Right ventricular systolic function is severely reduced. The right  ventricular size is mildly enlarged. There is moderately elevated  pulmonary artery systolic pressure. The estimated right ventricular  systolic pressure is 57.5 mmHg.   3. Left atrial size was severely dilated.   4. The mitral valve is normal in structure. Moderate mitral valve  regurgitation. No evidence of mitral stenosis.   5. Tricuspid valve regurgitation is moderate.   6. The aortic valve is tricuspid. Aortic valve regurgitation is trivial.  Aortic valve sclerosis/calcification is present, without any evidence of  aortic stenosis. Aortic valve area, by VTI measures 2.69 cm. Aortic valve  mean gradient measures 1.0 mmHg.   Aortic valve Vmax measures 0.76 m/s.   7. Pulmonic valve regurgitation is moderate.    8. Aortic dilatation noted. There is mild dilatation of the ascending  aorta, measuring 39 mm.   9. The inferior vena cava is dilated in size with <50% respiratory  variability, suggesting right atrial pressure of 15 mmHg.     Neuro/Psych negative neurological ROS  negative psych ROS   GI/Hepatic negative GI ROS, Neg liver ROS,,,  Endo/Other  negative endocrine ROS    Renal/GU negative Renal ROS  negative genitourinary   Musculoskeletal   Abdominal   Peds  Hematology negative hematology ROS (+)   Anesthesia Other Findings Past Medical History: No date: CHF (congestive heart failure) (HCC) No date: HFrEF (heart failure with reduced ejection fraction) (HCC)     Comment:  a. 04/2018 Echo: EF 25-30%; b. 06/2018 Echo: EF 45-50%.               diff HK, Gr1 DD. Nl RV fxn; c. 06/2019 Echo: EF 30-35%,               diff HK, mod reduced RV fxn, mild BAE, mild-mod MR, mild               PS. No date: Hypertension No date: NICM (nonischemic cardiomyopathy) (HCC)     Comment:  a. 04/2018 Echo: EF 25-30%; b. 06/2018 Echo: EF 45-50%; c.              06/2019 Echo: EF 30-35%. No date: Persistent atrial fibrillation (HCC)     Comment:  a. Dx 04/2018 s/p DCCV and amio loading. CHA2DS2VASc               3-->eliquis .  Past Surgical History: No date: CARDIAC CATHETERIZATION 04/09/2022: CARDIOVERSION; N/A  Comment:  Procedure: CARDIOVERSION;  Surgeon: Constancia Delton,               MD;  Location: [facility] ORS;  Service: Cardiovascular;                Laterality: N/A; 05/17/2022: CARDIOVERSION; N/A     Comment:  Procedure: CARDIOVERSION;  Surgeon: Devorah Fonder,               MD;  Location: [facility];  Service: Cardiovascular;                Laterality: N/A; 05/05/2018: RIGHT/LEFT HEART CATH AND CORONARY ANGIOGRAPHY; N/A     Comment:  Procedure: RIGHT/LEFT HEART CATH AND CORONARY               ANGIOGRAPHY;  Surgeon: Wenona Hamilton, MD;  Location:               [facility];   Service: Cardiovascular;                Laterality: N/A; 05/07/2018: TEE WITHOUT CARDIOVERSION; N/A     Comment:  Procedure: TRANSESOPHAGEAL ECHOCARDIOGRAM (TEE);                Surgeon: Sammy Crisp, MD;  Location: [facility] OR[facility];                Service: Cardiovascular;  Laterality: N/A; 04/09/2022: TEE WITHOUT CARDIOVERSION; N/A     Comment:  Procedure: TRANSESOPHAGEAL ECHOCARDIOGRAM (TEE);                Surgeon: Constancia Delton, MD;  Location: [facility];                Service: Cardiovascular;  Laterality: N/A;  BMI    Body Mass Index: 22.04 kg/m      Reproductive/Obstetrics negative OB ROS                             Anesthesia Physical Anesthesia Plan  ASA: 3  Anesthesia Plan: General   Post-op Pain Management: Minimal or no pain anticipated   Induction: Intravenous  PONV Risk Score and Plan: 1 and Propofol  infusion and TIVA  Airway Management Planned: Natural Airway and Nasal Cannula  Additional Equipment:   Intra-op Plan:   Post-operative Plan:   Informed Consent: I have reviewed the patients History and Physical, chart, labs and discussed the procedure including the risks, benefits and alternatives for the proposed anesthesia with the patient or authorized representative who has indicated his/her understanding and acceptance.     Dental Advisory Given  Plan Discussed with: Anesthesiologist, CRNA and Surgeon  Anesthesia Plan Comments: (Patient consented for risks of anesthesia including but not limited to:  - adverse reactions to medications - risk of airway placement if required - damage to eyes, teeth, lips or other oral mucosa - nerve damage due to positioning  - sore throat or hoarseness - Damage to heart, brain, nerves, lungs, other parts of body or loss of life  Patient voiced understanding and assent.)       Anesthesia Quick Evaluation

## 2024-05-06 NOTE — Anesthesia Postprocedure Evaluation (Signed)
 Anesthesia Post Note  Patient: Darey Hershberger  Procedure(s) Performed: ECHOCARDIOGRAM, TRANSESOPHAGEAL CARDIOVERSION  Patient location during evaluation: Specials Recovery Anesthesia Type: General Level of consciousness: awake and alert Pain management: pain level controlled Vital Signs Assessment: post-procedure vital signs reviewed and stable Respiratory status: spontaneous breathing, nonlabored ventilation, respiratory function stable and patient connected to nasal cannula oxygen  Cardiovascular status: blood pressure returned to baseline and stable Postop Assessment: no apparent nausea or vomiting Anesthetic complications: no   No notable events documented.   Last Vitals:  Vitals:   05/06/24 0830 05/06/24 0845  BP: (!) 132/97 (!) 137/103  Pulse: 93 94  Resp: (!) 22 (!) 26  Temp:    SpO2: 96% 95%    Last Pain:  Vitals:   05/06/24 0845  TempSrc:   PainSc: 0-No pain                 Nancey Awkward

## 2024-05-07 ENCOUNTER — Encounter: Payer: Self-pay | Admitting: Cardiology

## 2024-05-07 LAB — ECHO TEE

## 2024-05-12 ENCOUNTER — Ambulatory Visit: Attending: Cardiology

## 2024-05-12 DIAGNOSIS — I428 Other cardiomyopathies: Secondary | ICD-10-CM

## 2024-05-12 DIAGNOSIS — I4819 Other persistent atrial fibrillation: Secondary | ICD-10-CM | POA: Diagnosis not present

## 2024-05-12 DIAGNOSIS — I5022 Chronic systolic (congestive) heart failure: Secondary | ICD-10-CM | POA: Diagnosis not present

## 2024-05-12 LAB — ECHOCARDIOGRAM COMPLETE
AR max vel: 3.9 cm2
AV Area VTI: 3.18 cm2
AV Area mean vel: 3.75 cm2
AV Mean grad: 2 mmHg
AV Peak grad: 3.8 mmHg
Ao pk vel: 0.97 m/s
Area-P 1/2: 2.52 cm2
Calc EF: 23.9 %
S' Lateral: 4.5 cm
Single Plane A2C EF: 27.9 %
Single Plane A4C EF: 15.7 %

## 2024-05-18 NOTE — Progress Notes (Deleted)
 Electrophysiology Office Note:   Date:  05/18/2024  ID:  Donald Molina, DOB Sep 02, 1951, MRN [id number]  Primary Cardiologist: Deatrice Cage, MD Electrophysiologist: Fonda Kitty, MD      History of Present Illness:   Donald Molina is a 73 y.o. male with h/o normal coronary arteries by LHC in 04/2018, chronic systolic heart failure secondary to NICM, persistent Afib s/p DCCV in 04/2022, HTN, and medication noncompliance who is being seen today for follow up evaluation of his atrial fibrillation.  TEE and DCCV was performed on 05/06/24.   Discussed the use of AI scribe software for clinical note transcription with the patient, who gave verbal consent to proceed.  History of Present Illness He has a history of atrial fibrillation dating back to at least 2019. Episodes have been associated with admissions for decompensated heart failure and drop in LVEF.  Previous episodes requiring cardioversion and amiodarone . Past cardioversions have successfully restored normal rhythm and he reports significant improvement in symptoms such as leg swelling and dyspnea immediate after. He is currently taking Eliquis  without missing any doses and has previously been on amiodarone  but not currently taking. No current symptoms of leg swelling or dyspnea are present, and he states he feels okay at present.  Review of systems complete and found to be negative unless listed in HPI.   EP Information / Studies Reviewed:    EKG is ordered today. Personal review as below. AF w/ RVR      EKG 03/31/24: AF   Echo 07/13/23:   1. Left ventricular ejection fraction, by estimation, is 25 to 30%. The  left ventricle has severely decreased function. The left ventricle  demonstrates global hypokinesis. There is mild left ventricular  hypertrophy of the basal-septal segment. Left ventricular diastolic function could not be evaluated. The average left  ventricular global longitudinal strain is -9.3 %. The global  longitudinal  strain is abnormal with apical sparing. Consider cardiac amyloidosis.   2. Right ventricular systolic function is severely reduced. The right  ventricular size is mildly enlarged. There is moderately elevated  pulmonary artery systolic pressure. The estimated right ventricular  systolic pressure is 57.5 mmHg.   3. Left atrial size was severely dilated.   4. The mitral valve is normal in structure. Moderate mitral valve  regurgitation. No evidence of mitral stenosis.   5. Tricuspid valve regurgitation is moderate.   6. The aortic valve is tricuspid. Aortic valve regurgitation is trivial.  Aortic valve sclerosis/calcification is present, without any evidence of  aortic stenosis. Aortic valve area, by VTI measures 2.69 cm. Aortic valve  mean gradient measures 1.0 mmHg.   Aortic valve Vmax measures 0.76 m/s.   7. Pulmonic valve regurgitation is moderate.   8. Aortic dilatation noted. There is mild dilatation of the ascending  aorta, measuring 39 mm.   9. The inferior vena cava is dilated in size with <50% respiratory  variability, suggesting right atrial pressure of 15 mmHg.   Echo 05/12/24:  1. Left ventricular ejection fraction, by estimation, is 25 to 30%. Left  ventricular ejection fraction by 2D MOD biplane is 23.9 %. The left  ventricle has severely decreased function. The left ventricle demonstrates  global hypokinesis. There is mild  left ventricular hypertrophy. Left ventricular diastolic parameters are  indeterminate.   2. Right ventricular systolic function is mildly reduced. The right  ventricular size is mildly enlarged.   3. Left atrial size was moderately dilated.   4. The mitral valve is normal in structure. Mild mitral  valve  regurgitation.   5. Tricuspid valve regurgitation is mild to moderate.   6. The aortic valve is tricuspid. Aortic valve regurgitation is not  visualized.   7. The inferior vena cava is normal in size with greater than 50%   respiratory variability, suggesting right atrial pressure of 3 mmHg.   Risk Assessment/Calculations:    CHA2DS2-VASc Score = 4   This indicates a 4.8% annual risk of stroke. The patient's score is based upon: CHF History: 1 HTN History: 1 Diabetes History: 0 Stroke History: 0 Vascular Disease History: 1 Age Score: 1 Gender Score: 0   {This patient has a significant risk of stroke if diagnosed with atrial fibrillation.  Please consider VKA or DOAC agent for anticoagulation if the bleeding risk is acceptable.   You can also use the SmartPhrase .[email or web address] for documentation.   :[id number]} No BP recorded.  {Refresh Note OR Click here to enter BP  :1}***        Physical Exam:   VS:  There were no vitals taken for this visit.   Wt Readings from Last 3 Encounters:  05/06/24 158 lb (71.7 kg)  04/14/24 179 lb 6.4 oz (81.4 kg)  03/31/24 180 lb 9.6 oz (81.9 kg)     GEN: Well nourished, well developed in no acute distress NECK: No JVD CARDIAC: Tachycardic, irregular RESPIRATORY:  Clear to auscultation without rales, wheezing or rhonchi  ABDOMEN: Soft, non-tender, non-distended EXTREMITIES:  No edema; No deformity   ASSESSMENT AND PLAN:    #. Persistent atrial fibrillation: Associated with decompensated heart failure and drop in LVEF. EF tends to recover when maintaining sinus rhythm. For this reason, we have prioritized a rhythm control strategy. #. Secondary hypercoagulable state due to atrial fibrillation: CHADSVASC score of 4.  - Due to long-term issues with amiodarone  compliance, catheter ablation may be a better option. Patient is agreeable to this. Would like to reassess LVEF in sinus prior to ablation. We will reload amiodarone  400mg  BID for 1 week, 200mg  BID for 1 week, then 200mg  once daily thereafter. He denies any missed doses of Eliquis  but due to history of non-compliance, we will repeat TEE at time of DCCV in 1 week. He will return for follow up with me 3 weeks after  cardioversion and have echocardiogram done at that time.  - Continue Eliquis  5mg  BID.   #. Chronic systolic heart failure: LVEF has ranged from 25% in AF to 50-55% in 07/2022 when in normal rhythm.  #. NICM: Presumptive tachy/arrhythmia mediated. Normal LHC in 2019.  - Continue dapagliflozin  10mg  daily, losartan  12.5mg  daily and spironolactone  25mg  daily. Follow up with primary cardiologist.   #Hypertension -At  goal today.  Recommend checking blood pressures 1-2 times per week at home and recording the values.  Recommend bringing these recordings to the primary care physician.  Follow up with Dr. Kennyth in 4 weeks  Signed, Fonda Kennyth, MD

## 2024-05-19 ENCOUNTER — Ambulatory Visit: Admitting: Cardiology

## 2024-05-24 ENCOUNTER — Ambulatory Visit: Payer: Self-pay | Admitting: Cardiology

## 2024-05-25 ENCOUNTER — Telehealth: Payer: Self-pay | Admitting: Family

## 2024-05-25 NOTE — Telephone Encounter (Signed)
 Called to confirm/remind patient of their appointment [facility] on 05/26/24.   Appointment:   [x] Confirmed  [] Left mess   [] No answer/No voice mail  [] VM Full/unable to leave message  [] Phone not in service  Patient reminded to bring all medications and/or complete list.  Confirmed patient has transportation. Gave directions, instructed to utilize valet parking.

## 2024-05-26 ENCOUNTER — Ambulatory Visit: Admitting: Family

## 2024-05-26 ENCOUNTER — Ambulatory Visit: Payer: Self-pay | Admitting: Family

## 2024-05-26 ENCOUNTER — Encounter: Payer: Self-pay | Admitting: Family

## 2024-05-26 ENCOUNTER — Other Ambulatory Visit
Admission: RE | Admit: 2024-05-26 | Discharge: 2024-05-26 | Disposition: A | Discharge status: Home / Self Care | Source: Ambulatory Visit | Attending: Family | Admitting: Family

## 2024-05-26 VITALS — BP 127/90 | HR 58 | Wt 182.0 lb

## 2024-05-26 DIAGNOSIS — I4891 Unspecified atrial fibrillation: Secondary | ICD-10-CM | POA: Diagnosis not present

## 2024-05-26 DIAGNOSIS — I4819 Other persistent atrial fibrillation: Secondary | ICD-10-CM | POA: Diagnosis not present

## 2024-05-26 DIAGNOSIS — I43 Cardiomyopathy in diseases classified elsewhere: Secondary | ICD-10-CM

## 2024-05-26 DIAGNOSIS — I2510 Atherosclerotic heart disease of native coronary artery without angina pectoris: Secondary | ICD-10-CM

## 2024-05-26 DIAGNOSIS — I5022 Chronic systolic (congestive) heart failure: Secondary | ICD-10-CM | POA: Diagnosis not present

## 2024-05-26 DIAGNOSIS — E854 Organ-limited amyloidosis: Secondary | ICD-10-CM

## 2024-05-26 DIAGNOSIS — I10 Essential (primary) hypertension: Secondary | ICD-10-CM | POA: Diagnosis not present

## 2024-05-26 LAB — BASIC METABOLIC PANEL WITH GFR
Anion gap: 9 (ref 5–15)
BUN: 15 mg/dL (ref 8–23)
CO2: 27 mmol/L (ref 22–32)
Calcium: 9.1 mg/dL (ref 8.9–10.3)
Chloride: 105 mmol/L (ref 98–111)
Creatinine, Ser: 0.87 mg/dL (ref 0.61–1.24)
GFR, Estimated: >60 mL/min (ref >60)
Glucose, Bld: 86 mg/dL (ref 70–99)
Potassium: 3.7 mmol/L (ref 3.5–5.1)
Sodium: 141 mmol/L (ref 135–145)

## 2024-05-26 LAB — CBC
HCT: 35.4 % — ABNORMAL LOW (ref 39.0–52.0)
Hemoglobin: 13 g/dL (ref 13.0–17.0)
MCH: 32.2 pg (ref 26.0–34.0)
MCHC: 36.7 g/dL — ABNORMAL HIGH (ref 30.0–36.0)
MCV: 87.6 fL (ref 80.0–100.0)
Platelets: 238 10*3/uL (ref 150–400)
RBC: 4.04 MIL/uL — ABNORMAL LOW (ref 4.22–5.81)
RDW: 12.4 % (ref 11.5–15.5)
WBC: 6.1 10*3/uL (ref 4.0–10.5)
nRBC: 0 % (ref 0.0–0.2)

## 2024-05-26 LAB — LIPID PANEL
Cholesterol: 132 mg/dL (ref 0–200)
HDL: 29 mg/dL — ABNORMAL LOW (ref >40)
LDL Cholesterol: 84 mg/dL (ref 0–99)
Total CHOL/HDL Ratio: 4.6 ratio
Triglycerides: 96 mg/dL (ref <150)
VLDL: 19 mg/dL (ref 0–40)

## 2024-05-26 MED ORDER — SACUBITRIL-VALSARTAN 24-26 MG PO TABS: 1.0000 | ORAL_TABLET | Freq: Two times a day (BID) | ORAL | 3 refills | Status: AC

## 2024-05-26 NOTE — Progress Notes (Signed)
 Advanced Heart Failure Clinic Note    PCP: None (used to go to [facility]) Cardiologist: Deatrice Cage, MD / Abigail Motto, [place]  Chief Complaint: HF visit   HPI:  [name] is a 73 y/o male with a history of atrial fibrillation/ flutter (cardioverted 07/23), HTN, pulmonary HTN (06/19), NSTEMI, remote tobacco use and chronic heart failure.   Admitted 04/2018 with A-fib and volume overload. Echo demonstrated an EF of 25 to 30%. Cardiac cath showed normal coronary arteries. Following diuresis and amiodarone  loading, he underwent successful DCCV.  He had recurrent A-fib following discharge in the setting of medication noncompliance with subsequent successful cardioversion thereafter following initiation of medications.  Follow-up echo in 06/2018 showed an improved LVEF of 45 to 50%.   Admitted 07/03/2019 with dyspnea recurrent A-fib in the setting of medication noncompliance.  EF was again found to be reduced at 30 to 35% with diffuse hypokinesis.  He was diuresed and placed back on beta-blocker and apixaban    Admitted 03/2022 with volume overload and recurrent A-fib in the context of medication noncompliance.  He reported having not filled his medications for at least a year.  High-sensitivity troponin peaked at 80 with subsequent downtrend.  BNP 1383.  Echo demonstrated an EF of 25%, global hypokinesis with LVH, indeterminate LV diastolic function parameters, moderately reduced RV systolic function with normal ventricular cavity size and mildly increased ventricular wall thickness, elevated PASP estimated at 64 mmHg, moderately dilated left atrium moderate to severe mitral valve regurgitation, moderate tricuspid regurgitation, and an estimated right atrial pressure 15 mmHg.  He underwent diuresis and escalation of GDMT.  He subsequently underwent unusuccessful TEE guided DCCV with 2 shocks at 200 J.  Echo 08/15/22: EF 50-55% with grade I DD    Admitted 07/13/23 due to shortness of breath X 2 days along  with PND and orthopnea. Heart rate in the 130-150s, blood pressure significantly elevated 160/110, no hypoxia. Chest x-ray showed cardiomegaly and pulmonary congestion. Covid +. BNP 1,668. Troponin 21>20. Initially given IV cardizem  bolus & gtt. Cardiology consulted. 1 dose of IV lasix  given. Converted to NSR on cardizem  gtt. Echo 07/13/23: EF 25-30% with mild LVH, moderately elevated PA Pressure of 57.5 mmHg, severe LAE, moderate MR/ TR/ PR, mild dilatation of ascending aorta of 39 mm, the global longitudinal strain is abnormal (9.3%) with apical sparing. Consider cardiac amyloidosis. Arrhythmia thought to have been triggered by covid. EP consulted but did not see patient while inpatient. Follow-up with EP as outpatient.   Was in the ED 11/06/23 due to worsening bilateral lower leg edema along with shortness of breath. Ran out of metoprolol  as he needed refills. Admitted to hospitalist service for Afib RVR and CHF exacerbation. Started on diltiazem  gtt and diuresis IV. Edema resolved and off diltiazem  gtt.    Was in the ED 03/01/24 with leg swelling after running out of lasix  for the preceding week. CXR negative. Lasix  provided and he was released.   TEE 05/06/24: EF 30-35%, RV mildly reduced,mild MR, no thrombus. Successful DCCV done 05/06/24.   Echo 05/12/24: EF 25-30%, mild LVH, mildly reduced EF, mild MR, mild/ moderate TR, moderate LAE  He presents today with a chief complaint of a heart failure visit. Denies chest pain, palpitations, shortness of breath, fatigue, cough, abdominal distention, pedal edema, dizziness or difficulty sleeping. Has been taking amiodarone  200mg  daily.    Previous cardiac studies:  RHC/ LHC done 05/05/18: normal coronary arteries, severely elevated filling pressures, moderate pulmonary HTN and severely reduced cardiac  output. CO was 2.98 with cardiac index of 1.47  Echo 05/03/18: EF 25-30% along with moderate MR/TR and moderately elevated PA pressure of 52 mm Hg.  TEE  05/07/18: EF 20-25% along with trivial AR, mild/mod MR & TR and small pleural effusion.  ROS: All systems negative except what is listed in HPI, PMH and Problem List   Past Medical History:  Diagnosis Date   CHF (congestive heart failure) (HCC)    HFrEF (heart failure with reduced ejection fraction) (HCC)    a. 04/2018 Echo: EF 25-30%; b. 06/2018 Echo: EF 45-50%. diff HK, Gr1 DD. Nl RV fxn; c. 06/2019 Echo: EF 30-35%, diff HK, mod reduced RV fxn, mild BAE, mild-mod MR, mild PS.   Hypertension    NICM (nonischemic cardiomyopathy) (HCC)    a. 04/2018 Echo: EF 25-30%; b. 06/2018 Echo: EF 45-50%; c. 06/2019 Echo: EF 30-35%.   Persistent atrial fibrillation (HCC)    a. Dx 04/2018 s/p DCCV and amio loading. CHA2DS2VASc 3-->eliquis .    Current Outpatient Medications  Medication Sig Dispense Refill   amiodarone  (PACERONE ) 200 MG tablet Take 2 tablets (400 mg total) by mouth 2 (two) times daily for 7 days, THEN 2 tablets (400 mg total) daily for 7 days, THEN 1 tablet (200 mg total) daily. 110 tablet 3   apixaban  (ELIQUIS ) 5 MG TABS tablet Take 1 tablet (5 mg total) by mouth 2 (two) times daily. 180 tablet 3   dapagliflozin  propanediol (FARXIGA ) 10 MG TABS tablet Take 1 tablet (10 mg total) by mouth daily before breakfast. 90 tablet 3   furosemide  (LASIX ) 40 MG tablet Take 40 mg by mouth daily. With additional 40mg  PRN     losartan  (COZAAR ) 25 MG tablet Take 0.5 tablets (12.5 mg total) by mouth daily. 45 tablet 3   spironolactone  (ALDACTONE ) 25 MG tablet Take 1 tablet (25 mg total) by mouth daily. 90 tablet 3   No current facility-administered medications for this visit.    Allergies  Allergen Reactions   Entresto  [Sacubitril -Valsartan ] Other (See Comments)    hypotension      Social History   Socioeconomic History   Marital status: Divorced    Spouse name: Not on file   Number of children: 2   Years of education: 12   Highest education level: 12th grade  Occupational History   Occupation:  retired  Tobacco Use   Smoking status: Former    Current packs/day: 0.00    Types: Cigarettes    Start date: 05/28/1988    Quit date: 05/28/1998    Years since quitting: 26.0   Smokeless tobacco: Former    Types: Chew    Quit date: 05/28/1998  Vaping Use   Vaping status: Never Used  Substance and Sexual Activity   Alcohol use: Yes    Alcohol/week: 12.0 standard drinks of alcohol    Types: 12 Cans of beer per week   Drug use: Never   Sexual activity: Yes    Birth control/protection: Condom  Other Topics Concern   Not on file  Social History Narrative   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   Social Drivers of Corporate investment banker Strain: Low Risk  (08/12/2019)   Received from [facility]   Overall Financial Resource Strain (CARDIA)    Difficulty of Paying Living Expenses: Not hard at all  Food Insecurity: No Food Insecurity (07/13/2023)   Hunger Vital Sign    Worried About Running Out of Food in  the Last Year: Never true    Ran Out of Food in the Last Year: Never true  Transportation Needs: No Transportation Needs (07/13/2023)   PRAPARE - Administrator, Civil Service (Medical): No    Lack of Transportation (Non-Medical): No  Physical Activity: Insufficiently Active (05/28/2018)   Exercise Vital Sign    Days of Exercise per Week: 7 days    Minutes of Exercise per Session: 20 min  Stress: No Stress Concern Present (05/28/2018)   [employer] of Occupational Health - Occupational Stress Questionnaire    Feeling of Stress : Not at all  Social Connections: Not on file  Intimate Partner Violence: Not At Risk (07/13/2023)   Humiliation, Afraid, Rape, and Kick questionnaire    Fear of Current or Ex-Partner: No    Emotionally Abused: No    Physically Abused: No    Sexually Abused: No      Family History  Problem Relation Age of Onset   Cancer Mother        pancreatic   Cervical cancer Mother    Dementia Father    Heart  failure Father    Heart disease Sister    Vitals:   05/26/24 0925  BP: (!) 127/90  Pulse: (!) 58  SpO2: 100%  Weight: 182 lb (82.6 kg)   Wt Readings from Last 3 Encounters:  05/26/24 182 lb (82.6 kg)  05/06/24 158 lb (71.7 kg)  04/14/24 179 lb 6.4 oz (81.4 kg)   Lab Results  Component Value Date   CREATININE 0.98 03/31/2024   CREATININE 1.04 03/01/2024   CREATININE 0.90 11/15/2023    PHYSICAL EXAM:  General: Well appearing. No resp difficulty HEENT: normal Neck: supple, no JVD Cor: Irregular rhythm, bradycardic. No rubs, gallops or murmurs Lungs: clear Abdomen: soft, nontender, nondistended. Extremities: no cyanosis, clubbing, rash, edema Neuro: alert & oriented X 3. Moves all 4 extremities w/o difficulty. Affect pleasant   ECG: AF HR 85 (personally reviewed)   ASSESSMENT & PLAN:  1: NICM with reduced ejection fraction- - suspect due to ? pHTN, amyloid, persistent atrial fibrillation - NYHA class I - euvolemic today - weight up 2 pounds from last visit here 3 months ago - Echo 08/15/22: EF 50-55% with grade I DD - Echo 07/13/23: EF 25-30% with mild LVH, moderately elevated PA Pressure of 57.5 mmHg, severe LAE, moderate MR/ TR/ PR, mild dilatation of ascending aorta of 39 mm, the global longitudinal strain is abnormal (9.3%) with apical sparing. Consider cardiac amyloidosis.  - Echo 05/12/24: EF 25-30%, mild LVH, mildly reduced EF, mild MR, mild/ moderate TR, moderate LAE - order placed for cMRI to evaluate for amyloidosis - continue farxiga  10mg  daily  - continue furosemide  40mg  BID - had hypotension w/ entresto  in the past but willing to try again; stop losartan  and begin entresto  24/26mg  BID - continue spironolactone  25mg  daily; BMET today - bradycardic on amiodarone  so unable to use beta-blocker - BNP 03/01/24 was 420.8  2: HTN- - BP 127/90 - has seen PCP @ [facility] in the past; discussed, again, the importance of getting back in with [facility] if that's  where he wants to stay - BMP 03/31/24 reviewed: sodium 143, potassium 4.5, creatinine 0.98 and GFR 81.  - BMET today  3: Persistent Atrial fibrillation/ flutter- - cardioverted 07/23, 06/25 - EKG today is AF - continue apixaban  5mg  BID - continue amiodarone  200mg  daily - saw EP Anice) 05/25; returns 07/25 - CBC today  4: CAD- -  RHC/ LHC done 05/05/18: normal coronary arteries, severely elevated filling pressures, moderate pulmonary HTN and severely reduced cardiac output. CO was 2.98 with cardiac index of 1.47 - LDL 11/15/23 was 84 - lipid panel today - saw cardiology Dene) 05/25   Return in 1 month to see HF MD, sooner if needed.   Ellouise Class, [place] 05/26/24

## 2024-05-26 NOTE — Patient Instructions (Signed)
 Medication Changes:  STOP Losartan   START Entresto  24/26mg  (1 tab) two times daily  Lab Work:  Go over to the [facility]. Go pass the gift shop and have your blood work completed.  We will only call you if the results are abnormal or if the provider would like to make medication changes.   Testing/Procedures:    SOME WILL BE CALLING IN ORDER TO SCHEDULE YOUR APPOINTMENT.  [facility] [address] [place], [place] [address] Please go to the [facility] and check-in with the desk attendant.   Magnetic resonance imaging (MRI) is a painless test that produces images of the inside of the body without using Xrays.  During an MRI, strong magnets and radio waves work together in a Data processing manager to form detailed images.   MRI images may provide more details about a medical condition than X-rays, CT scans, and ultrasounds can provide.  You may be given earphones to listen for instructions.  You may eat a light breakfast and take medications as ordered with the exception of furosemide , hydrochlorothiazide, chlorthalidone or spironolactone  (or any other fluid pill). If you are undergoing a stress MRI, please avoid stimulants for 12 hr prior to test. (I.e. Caffeine, nicotine, chocolate, or antihistamine medications)  If your provider has ordered anti-anxiety medications for this test, then you will need a driver.  An IV will be inserted into one of your veins. Contrast material will be injected into your IV. It will leave your body through your urine within a day. You may be told to drink plenty of fluids to help flush the contrast material out of your system.  You will be asked to remove all metal, including: Watch, jewelry, and other metal objects including hearing aids, hair pieces and dentures. Also wearable glucose monitoring systems (ie. Freestyle Libre and Omnipods) (Braces and fillings normally are not a problem.)   TEST WILL TAKE APPROXIMATELY 1  HOUR  PLEASE NOTIFY SCHEDULING AT LEAST 24 HOURS IN ADVANCE IF YOU ARE UNABLE TO KEEP YOUR APPOINTMENT. [phone]  For more information and frequently asked questions, please visit our website : [email or web address]  Please call the Cardiac Imaging Nurse Navigators with any questions/concerns. [phone] Office    Follow-Up in: Please follow up with the Advanced Heart Failure Clinic in 1 month.  At the Advanced [facility] Failure Clinic, you and your health needs are our priority. We have a designated team specialized in the treatment of Heart Failure. This Care Team includes your primary Heart Failure Specialized Cardiologist (physician), Advanced Practice Providers (APPs- Physician Assistants and Nurse Practitioners), and Pharmacist who all work together to provide you with the care you need, when you need it.   You may see any of the following providers on your designated Care Team at your next follow up:  Dr. Toribio Fuel Dr. Ezra Shuck Dr. Ria Commander Dr. Odis Brownie Ellouise Class, FNP Jaun Bash, RPH-CPP  Please be sure to bring in all your medications bottles to every appointment.   Need to Contact Us :  If you have any questions or concerns before your next appointment please send us  a message through [place] or call our office at [phone].    TO LEAVE A MESSAGE FOR THE NURSE SELECT OPTION 2, PLEASE LEAVE A MESSAGE INCLUDING: YOUR NAME DATE OF BIRTH CALL BACK NUMBER REASON FOR CALL**this is important as we prioritize the call backs  YOU WILL RECEIVE A CALL BACK THE SAME DAY AS LONG AS YOU CALL BEFORE 4:00 PM

## 2024-05-27 MED ORDER — ROSUVASTATIN CALCIUM 5 MG PO TABS: 5.0000 mg | ORAL_TABLET | Freq: Every day | ORAL | 3 refills | Status: AC

## 2024-06-03 ENCOUNTER — Ambulatory Visit

## 2024-06-10 ENCOUNTER — Ambulatory Visit: Attending: Medical | Admitting: Medical

## 2024-06-10 NOTE — Progress Notes (Deleted)
 Cardiology Office Note   Date:  06/10/2024  ID:  Donald Molina, DOB [birth year], MRN [id number] PCP: [place], No  [facility] Providers Cardiologist:  Deatrice Cage, MD Electrophysiologist:  Fonda Kitty, MD { Click to update primary MD,subspecialty MD or APP then REFRESH:1}    History of Present Illness Donald Molina is a 73 y.o. male with a h/o  normal coronary arteries by LHC in 04/2018, HFrEF secondary to NICM, persistent Afib s/p DCCV in 04/2022, HTN, and medication noncompliance leading to recurrent hospital admissions who presents for follow-up for CM and Afib.    He was admitted with Afib and volume overload. Echo demonstrated an EF 25-30%. Cardiac cath showed normal coronary arteries. Following diuresis and amiodarone  loading, he underwent successful DCCV. He had recurrent Afib following discharge in the setting of medication noncompliance with with subsequent successful cardioversion thereafter following initiation of medications. Follow-up echo in 06/2018 showed an improved LVEF 45-50%. 06/2019, he was readmitted with dyspnea recurrent Afib in the setting of medication noncomplaince. EF was found to be reduced at 30-35% with diffuse HK. He was diuresed and placed back on BB and Eliquis  with plan for rhythm management in the outpatient setting. He was seen in the office 07/2020 and was noted to be n SR. He was lost to follow-up.    He was admitted to the hospital in 03/2022 with volume overload and recurrent A-fib in the context of medication noncompliance.  He reported having not filled his medications for at least a year.  High-sensitivity troponin peaked at 80 with subsequent downtrend.  BNP 1383.  Echo demonstrated an EF of 25%, global hypokinesis with LVH, indeterminate LV diastolic function parameters, moderately reduced RV systolic function with normal ventricular cavity size and mildly increased ventricular wall thickness, elevated PASP estimated at 64 mmHg, moderately dilated  left atrium moderate to severe mitral valve regurgitation, moderate tricuspid regurgitation, and an estimated right atrial pressure 15 mmHg.  He underwent diuresis and escalation of GDMT.  He subsequently underwent unusuccessful TEE guided DCCV with 2 shocks at 200 J.  Following this, it was recommended he be loaded with amiodarone  with plans for repeat attempt of DCCV in the outpatient setting.   He was seen in hospital follow-up on 04/16/2022 and was without symptoms of angina or decompensation.  His weight remained stable by his home scale at 168 pounds.  He reported adherence to medications.  We were planning to escalate GDMT based on updated renal function/electrolytes.  Labs obtained at that time showed mild AKI with recommendation to decrease furosemide  and KCl and defer escalation of GDMT until labs could be trended.  However, we were unable to get in contact with the patient, therefore a letter was sent to him.   He was seen in the office on 05/10/2022 and was without symptoms of angina or decompensation.  He was adherent to his medications.  Escalation of GDMT was deferred given concerns regarding follow-up of labs, as we had been unable to get in touch with him via phone previously.  He underwent successful DCCV on 05/17/2022.   He was seen in the office 06/2022 and remained wtihout symptoms. Echo 07/2022 demonstrated and improvement in his LVSF with an EF 50-55%, no WMA, G1DD, normal RVSF. Toprol  reduced for bradycardia.    The patient was admitted 06/2023 with recurrent Afib in the setting of noncompliance found to have reduced EF. He converted to NSR with IV dilt. Echo showed LVEF 25-30%, global HK, severe LAE, moderate MR and TR.  The patient was diuresed. Plan was for OP follow-up with EP, but he was lost follow-up.  He was seen 03/31/24 for pre-op clearance for dental procedure. He was in Aflutter and referred to EP who recommended TEE/DCCV.   Today Afib?    ROS: ***  Studies Reviewed       *** Risk Assessment/Calculations {Does this patient have ATRIAL FIBRILLATION?:[phone]} No BP recorded.  {Refresh Note OR Click here to enter BP  :1}***       Physical Exam VS:  There were no vitals taken for this visit.       Wt Readings from Last 3 Encounters:  05/26/24 182 lb (82.6 kg)  05/06/24 158 lb (71.7 kg)  04/14/24 179 lb 6.4 oz (81.4 kg)    GEN: Well nourished, well developed in no acute distress NECK: No JVD; No carotid bruits CARDIAC: ***RRR, no murmurs, rubs, gallops RESPIRATORY:  Clear to auscultation without rales, wheezing or rhonchi  ABDOMEN: Soft, non-tender, non-distended EXTREMITIES:  No edema; No deformity   ASSESSMENT AND PLAN ***    {Are you ordering a CV Procedure (e.g. stress test, cath, DCCV, TEE, etc)?   Press F2        :[id number]}  Dispo: ***  Signed, Olene Godfrey VEAR Fishman, PA-C

## 2024-06-16 ENCOUNTER — Ambulatory Visit: Attending: Cardiology | Admitting: Cardiology

## 2024-06-16 NOTE — Progress Notes (Deleted)
 Electrophysiology Office Note:   Date:  06/16/2024  ID:  Donald Molina, DOB [birth year], MRN [id number]  Primary Cardiologist: Deatrice Cage, MD Electrophysiologist: Fonda Kitty, MD      History of Present Illness:   Donald Molina is a 73 y.o. male with h/o normal coronary arteries by LHC in 04/2018, chronic systolic heart failure secondary to NICM, persistent Afib s/p DCCV in 04/2022, HTN, and medication noncompliance who is being seen today for follow up evaluation of his atrial fibrillation.  TEE and DCCV was performed on 05/06/24.   Discussed the use of AI scribe software for clinical note transcription with the patient, who gave verbal consent to proceed.  History of Present Illness He has a history of atrial fibrillation dating back to at least 2019. Episodes have been associated with admissions for decompensated heart failure and drop in LVEF.  Previous episodes requiring cardioversion and amiodarone . Past cardioversions have successfully restored normal rhythm and he reports significant improvement in symptoms such as leg swelling and dyspnea immediate after. He is currently taking Eliquis  without missing any doses and has previously been on amiodarone  but not currently taking. No current symptoms of leg swelling or dyspnea are present, and he states he feels okay at present.  Review of systems complete and found to be negative unless listed in HPI.   EP Information / Studies Reviewed:    EKG is ordered today. Personal review as below. AF w/ RVR      EKG 03/31/24: AF   Echo 07/13/23:   1. Left ventricular ejection fraction, by estimation, is 25 to 30%. The  left ventricle has severely decreased function. The left ventricle  demonstrates global hypokinesis. There is mild left ventricular  hypertrophy of the basal-septal segment. Left ventricular diastolic function could not be evaluated. The average left  ventricular global longitudinal strain is -9.3 %. The global  longitudinal  strain is abnormal with apical sparing. Consider cardiac amyloidosis.   2. Right ventricular systolic function is severely reduced. The right  ventricular size is mildly enlarged. There is moderately elevated  pulmonary artery systolic pressure. The estimated right ventricular  systolic pressure is 57.5 mmHg.   3. Left atrial size was severely dilated.   4. The mitral valve is normal in structure. Moderate mitral valve  regurgitation. No evidence of mitral stenosis.   5. Tricuspid valve regurgitation is moderate.   6. The aortic valve is tricuspid. Aortic valve regurgitation is trivial.  Aortic valve sclerosis/calcification is present, without any evidence of  aortic stenosis. Aortic valve area, by VTI measures 2.69 cm. Aortic valve  mean gradient measures 1.0 mmHg.   Aortic valve Vmax measures 0.76 m/s.   7. Pulmonic valve regurgitation is moderate.   8. Aortic dilatation noted. There is mild dilatation of the ascending  aorta, measuring 39 mm.   9. The inferior vena cava is dilated in size with <50% respiratory  variability, suggesting right atrial pressure of 15 mmHg.   Echo 05/12/24:  1. Left ventricular ejection fraction, by estimation, is 25 to 30%. Left  ventricular ejection fraction by 2D MOD biplane is 23.9 %. The left  ventricle has severely decreased function. The left ventricle demonstrates  global hypokinesis. There is mild  left ventricular hypertrophy. Left ventricular diastolic parameters are  indeterminate.   2. Right ventricular systolic function is mildly reduced. The right  ventricular size is mildly enlarged.   3. Left atrial size was moderately dilated.   4. The mitral valve is normal in structure. Mild mitral  valve  regurgitation.   5. Tricuspid valve regurgitation is mild to moderate.   6. The aortic valve is tricuspid. Aortic valve regurgitation is not  visualized.   7. The inferior vena cava is normal in size with greater than 50%   respiratory variability, suggesting right atrial pressure of 3 mmHg.   Risk Assessment/Calculations:    CHA2DS2-VASc Score = 4   This indicates a 4.8% annual risk of stroke. The patient's score is based upon: CHF History: 1 HTN History: 1 Diabetes History: 0 Stroke History: 0 Vascular Disease History: 1 Age Score: 1 Gender Score: 0   {This patient has a significant risk of stroke if diagnosed with atrial fibrillation.  Please consider VKA or DOAC agent for anticoagulation if the bleeding risk is acceptable.   You can also use the SmartPhrase .[email or web address] for documentation.   :[id number]} No BP recorded.  {Refresh Note OR Click here to enter BP  :1}***        Physical Exam:   VS:  There were no vitals taken for this visit.   Wt Readings from Last 3 Encounters:  05/26/24 182 lb (82.6 kg)  05/06/24 158 lb (71.7 kg)  04/14/24 179 lb 6.4 oz (81.4 kg)     GEN: Well nourished, well developed in no acute distress NECK: No JVD CARDIAC: Tachycardic, irregular RESPIRATORY:  Clear to auscultation without rales, wheezing or rhonchi  ABDOMEN: Soft, non-tender, non-distended EXTREMITIES:  No edema; No deformity   ASSESSMENT AND PLAN:    #. Persistent atrial fibrillation: Associated with decompensated heart failure and drop in LVEF. EF tends to recover when maintaining sinus rhythm. For this reason, we have prioritized a rhythm control strategy. #. Secondary hypercoagulable state due to atrial fibrillation: CHADSVASC score of 4.  - Due to long-term issues with amiodarone  compliance, catheter ablation may be a better option. Patient is agreeable to this. Would like to reassess LVEF in sinus prior to ablation. We will reload amiodarone  400mg  BID for 1 week, 200mg  BID for 1 week, then 200mg  once daily thereafter. He denies any missed doses of Eliquis  but due to history of non-compliance, we will repeat TEE at time of DCCV in 1 week. He will return for follow up with me 3 weeks after  cardioversion and have echocardiogram done at that time.  - Continue Eliquis  5mg  BID.   #. Chronic systolic heart failure: LVEF has ranged from 25% in AF to 50-55% in 07/2022 when in normal rhythm.  #. NICM: Presumptive tachy/arrhythmia mediated. Normal LHC in 2019.  - Continue dapagliflozin  10mg  daily, losartan  12.5mg  daily and spironolactone  25mg  daily. Follow up with primary cardiologist.   #Hypertension -At  goal today.  Recommend checking blood pressures 1-2 times per week at home and recording the values.  Recommend bringing these recordings to the primary care physician.  Follow up with Dr. Kennyth in 4 weeks  Signed, Fonda Kennyth, MD

## 2024-06-30 ENCOUNTER — Encounter: Admitting: Cardiology

## 2024-07-15 ENCOUNTER — Telehealth (HOSPITAL_COMMUNITY): Payer: Self-pay

## 2024-07-15 ENCOUNTER — Ambulatory Visit: Admission: RE | Admit: 2024-07-15 | Source: Ambulatory Visit

## 2024-07-15 NOTE — Telephone Encounter (Signed)
-----   Message from Nurse [place] R sent at 05/26/2024 10:03 AM EDT ----- Regarding: cmri precert  Test: cardiac mri  Insurance: [employer]  CPT:  if known  Location: [facility]  Dx: possible amyloid  Provider: Ellouise Class  Scheduled Date:  if known
